# Patient Record
Sex: Female | Born: 1965
Health system: Southern US, Community
[De-identification: ages and names within clinical notes are randomized; demographics above are authoritative.]

## PROBLEM LIST (undated history)

## (undated) DIAGNOSIS — I10 Essential (primary) hypertension: Secondary | ICD-10-CM

## (undated) DIAGNOSIS — R112 Nausea with vomiting, unspecified: Secondary | ICD-10-CM

## (undated) DIAGNOSIS — I200 Unstable angina: Secondary | ICD-10-CM

## (undated) DIAGNOSIS — G43909 Migraine, unspecified, not intractable, without status migrainosus: Secondary | ICD-10-CM

## (undated) DIAGNOSIS — I2510 Atherosclerotic heart disease of native coronary artery without angina pectoris: Secondary | ICD-10-CM

## (undated) DIAGNOSIS — Z8719 Personal history of other diseases of the digestive system: Secondary | ICD-10-CM

## (undated) DIAGNOSIS — F329 Major depressive disorder, single episode, unspecified: Secondary | ICD-10-CM

## (undated) DIAGNOSIS — F32A Depression, unspecified: Secondary | ICD-10-CM

## (undated) DIAGNOSIS — F419 Anxiety disorder, unspecified: Secondary | ICD-10-CM

## (undated) DIAGNOSIS — I219 Acute myocardial infarction, unspecified: Secondary | ICD-10-CM

## (undated) DIAGNOSIS — M1990 Unspecified osteoarthritis, unspecified site: Secondary | ICD-10-CM

## (undated) DIAGNOSIS — F319 Bipolar disorder, unspecified: Secondary | ICD-10-CM

## (undated) DIAGNOSIS — D649 Anemia, unspecified: Secondary | ICD-10-CM

## (undated) DIAGNOSIS — Z9889 Other specified postprocedural states: Secondary | ICD-10-CM

## (undated) DIAGNOSIS — Z9289 Personal history of other medical treatment: Secondary | ICD-10-CM

## (undated) DIAGNOSIS — R079 Chest pain, unspecified: Secondary | ICD-10-CM

## (undated) HISTORY — PX: ECTOPIC PREGNANCY SURGERY: SHX613

## (undated) HISTORY — PX: FOOT FRACTURE SURGERY: SHX645

## (undated) HISTORY — PX: CORONARY ANGIOPLASTY WITH STENT PLACEMENT: SHX49

---
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
## 2008-05-15 ENCOUNTER — Emergency Department (HOSPITAL_COMMUNITY): Admission: EM | Admit: 2008-05-15 | Discharge: 2008-05-15 | Payer: Self-pay | Admitting: Emergency Medicine

## 2008-05-21 ENCOUNTER — Emergency Department (HOSPITAL_COMMUNITY): Admission: EM | Admit: 2008-05-21 | Discharge: 2008-05-21 | Payer: Self-pay | Admitting: Emergency Medicine

## 2008-10-05 ENCOUNTER — Emergency Department (HOSPITAL_COMMUNITY): Admission: EM | Admit: 2008-10-05 | Discharge: 2008-10-05 | Payer: Self-pay | Admitting: Emergency Medicine

## 2009-07-23 ENCOUNTER — Emergency Department (HOSPITAL_COMMUNITY): Admission: EM | Admit: 2009-07-23 | Discharge: 2009-07-23 | Payer: Self-pay | Admitting: Emergency Medicine

## 2010-03-22 ENCOUNTER — Emergency Department (HOSPITAL_COMMUNITY): Admission: EM | Admit: 2010-03-22 | Discharge: 2010-03-22 | Payer: Self-pay | Admitting: Emergency Medicine

## 2010-09-01 ENCOUNTER — Emergency Department (HOSPITAL_COMMUNITY)
Admission: EM | Admit: 2010-09-01 | Discharge: 2010-09-01 | Payer: Self-pay | Source: Home / Self Care | Admitting: Emergency Medicine

## 2011-02-06 ENCOUNTER — Emergency Department (HOSPITAL_COMMUNITY): Payer: Self-pay

## 2011-02-06 ENCOUNTER — Emergency Department (HOSPITAL_COMMUNITY)
Admission: EM | Admit: 2011-02-06 | Discharge: 2011-02-07 | Disposition: A | Discharge status: Home / Self Care | Payer: Self-pay | Attending: Emergency Medicine | Admitting: Emergency Medicine

## 2011-02-06 DIAGNOSIS — R51 Headache: Secondary | ICD-10-CM | POA: Insufficient documentation

## 2011-02-06 DIAGNOSIS — S139XXA Sprain of joints and ligaments of unspecified parts of neck, initial encounter: Secondary | ICD-10-CM | POA: Insufficient documentation

## 2011-02-06 DIAGNOSIS — M47812 Spondylosis without myelopathy or radiculopathy, cervical region: Secondary | ICD-10-CM | POA: Insufficient documentation

## 2011-02-06 DIAGNOSIS — M545 Low back pain, unspecified: Secondary | ICD-10-CM | POA: Insufficient documentation

## 2011-02-06 DIAGNOSIS — Y92410 Unspecified street and highway as the place of occurrence of the external cause: Secondary | ICD-10-CM | POA: Insufficient documentation

## 2011-02-09 ENCOUNTER — Inpatient Hospital Stay (INDEPENDENT_AMBULATORY_CARE_PROVIDER_SITE_OTHER)
Admission: RE | Admit: 2011-02-09 | Discharge: 2011-02-09 | Disposition: A | Discharge status: Home / Self Care | Payer: Self-pay | Source: Ambulatory Visit | Attending: Family Medicine | Admitting: Family Medicine

## 2011-02-09 ENCOUNTER — Ambulatory Visit (INDEPENDENT_AMBULATORY_CARE_PROVIDER_SITE_OTHER): Payer: Self-pay

## 2011-02-09 DIAGNOSIS — S335XXA Sprain of ligaments of lumbar spine, initial encounter: Secondary | ICD-10-CM

## 2011-02-20 LAB — DIFFERENTIAL
Basophils Absolute: 0.1 10*3/uL (ref 0.0–0.1)
Eosinophils Relative: 1 % (ref 0–5)
Monocytes Relative: 5 % (ref 3–12)
Neutro Abs: 5.9 10*3/uL (ref 1.7–7.7)
Neutrophils Relative %: 67 % (ref 43–77)

## 2011-02-20 LAB — POCT I-STAT, CHEM 8: Hemoglobin: 12.9 g/dL (ref 12.0–15.0)

## 2011-02-20 LAB — CBC
HCT: 36.5 % (ref 36.0–46.0)
Hemoglobin: 12.2 g/dL (ref 12.0–15.0)
Platelets: 241 10*3/uL (ref 150–400)

## 2011-04-17 ENCOUNTER — Emergency Department (HOSPITAL_COMMUNITY)
Admission: EM | Admit: 2011-04-17 | Discharge: 2011-04-17 | Disposition: A | Discharge status: Home / Self Care | Payer: Self-pay | Attending: Emergency Medicine | Admitting: Emergency Medicine

## 2011-04-17 ENCOUNTER — Emergency Department (HOSPITAL_COMMUNITY): Payer: Self-pay

## 2011-04-17 DIAGNOSIS — J45909 Unspecified asthma, uncomplicated: Secondary | ICD-10-CM | POA: Insufficient documentation

## 2011-04-17 DIAGNOSIS — M79609 Pain in unspecified limb: Secondary | ICD-10-CM | POA: Insufficient documentation

## 2011-04-17 DIAGNOSIS — R209 Unspecified disturbances of skin sensation: Secondary | ICD-10-CM | POA: Insufficient documentation

## 2011-04-17 DIAGNOSIS — M7989 Other specified soft tissue disorders: Secondary | ICD-10-CM | POA: Insufficient documentation

## 2011-09-21 ENCOUNTER — Emergency Department (INDEPENDENT_AMBULATORY_CARE_PROVIDER_SITE_OTHER)
Admission: EM | Admit: 2011-09-21 | Discharge: 2011-09-21 | Disposition: A | Discharge status: Home / Self Care | Payer: Self-pay | Source: Home / Self Care | Attending: Emergency Medicine | Admitting: Emergency Medicine

## 2011-09-21 ENCOUNTER — Encounter (HOSPITAL_COMMUNITY): Payer: Self-pay | Admitting: Emergency Medicine

## 2011-09-21 ENCOUNTER — Emergency Department (HOSPITAL_COMMUNITY)
Admission: EM | Admit: 2011-09-21 | Discharge: 2011-09-21 | Disposition: A | Discharge status: Home / Self Care | Payer: Self-pay | Attending: Emergency Medicine | Admitting: Emergency Medicine

## 2011-09-21 ENCOUNTER — Encounter (HOSPITAL_COMMUNITY): Payer: Self-pay

## 2011-09-21 DIAGNOSIS — T169XXA Foreign body in ear, unspecified ear, initial encounter: Secondary | ICD-10-CM

## 2011-09-21 DIAGNOSIS — IMO0002 Reserved for concepts with insufficient information to code with codable children: Secondary | ICD-10-CM | POA: Insufficient documentation

## 2011-09-21 NOTE — ED Notes (Signed)
Pt with tip of q-tip in left ear

## 2011-09-21 NOTE — ED Provider Notes (Addendum)
History     CSN: [id number] Arrival date & time: 09/21/2011  8:34 PM   First MD Initiated Contact with Patient 09/21/11 2044      Chief Complaint  Patient presents with  . Foreign Body in Ear    (Consider location/radiation/quality/duration/timing/severity/associated sxs/prior treatment) Patient is a 45 y.o. female presenting with foreign body in ear. The history is provided by the patient. No language interpreter was used.  Foreign Body in Ear This is a new problem. The problem occurs constantly. The problem has not changed since onset.Pertinent negatives include no headaches. The symptoms are aggravated by nothing.   "got a piece of cotton ball in my left ear"...its sore...and tender I tried,,,to get it out"... History reviewed. No pertinent past medical history.  History reviewed. No pertinent past surgical history.  History reviewed. No pertinent family history.  History  Substance Use Topics  . Smoking status: Current Everyday Smoker  . Smokeless tobacco: Not on file  . Alcohol Use: No    OB History    Grav Para Term Preterm Abortions TAB SAB Ect Mult Living                  Review of Systems  HENT: Positive for ear pain. Negative for hearing loss, tinnitus and ear discharge.   Neurological: Negative for headaches.    Allergies  Review of patient's allergies indicates no known allergies.  Home Medications  No current outpatient prescriptions on file.  BP 142/87  Pulse 81  Temp(Src) 98.3 F (36.8 C) (Oral)  Resp 20  SpO2 98%  Physical Exam  Constitutional: She appears well-developed and well-nourished.  HENT:  Right Ear: No foreign bodies.  Left Ear: Hearing normal. There is tenderness. No drainage or swelling. A foreign body is present. No decreased hearing is noted.    ED Course  Procedures (including critical care time)  Labs Reviewed - No data to display No results found.   No diagnosis found.    MDM  Uncomplicated removal of ear  foreign object        Jimmie Molly 09/21/11 2107  Jimmie Molly 09/21/11 2115

## 2012-07-03 ENCOUNTER — Telehealth (HOSPITAL_COMMUNITY): Payer: Self-pay | Admitting: *Deleted

## 2012-07-03 ENCOUNTER — Emergency Department (INDEPENDENT_AMBULATORY_CARE_PROVIDER_SITE_OTHER)
Admission: EM | Admit: 2012-07-03 | Discharge: 2012-07-03 | Disposition: A | Discharge status: Home / Self Care | Payer: Self-pay | Source: Home / Self Care | Attending: Emergency Medicine | Admitting: Emergency Medicine

## 2012-07-03 ENCOUNTER — Encounter (HOSPITAL_COMMUNITY): Payer: Self-pay | Admitting: *Deleted

## 2012-07-03 DIAGNOSIS — N920 Excessive and frequent menstruation with regular cycle: Secondary | ICD-10-CM

## 2012-07-03 DIAGNOSIS — D259 Leiomyoma of uterus, unspecified: Secondary | ICD-10-CM

## 2012-07-03 DIAGNOSIS — D649 Anemia, unspecified: Secondary | ICD-10-CM

## 2012-07-03 LAB — CBC WITH DIFFERENTIAL/PLATELET
Basophils Absolute: 0 10*3/uL (ref 0.0–0.1)
HCT: 31 % — ABNORMAL LOW (ref 36.0–46.0)
Lymphocytes Relative: 23 % (ref 12–46)
Lymphs Abs: 2.3 10*3/uL (ref 0.7–4.0)
MCH: 27 pg (ref 26.0–34.0)
MCHC: 31.9 g/dL (ref 30.0–36.0)
MCV: 84.7 fL (ref 78.0–100.0)
Monocytes Absolute: 0.5 10*3/uL (ref 0.1–1.0)
Monocytes Relative: 5 % (ref 3–12)
Neutro Abs: 7 10*3/uL (ref 1.7–7.7)
RDW: 13.8 % (ref 11.5–15.5)
WBC: 9.9 10*3/uL (ref 4.0–10.5)

## 2012-07-03 LAB — POCT URINALYSIS DIP (DEVICE)
Leukocytes, UA: NEGATIVE
Nitrite: NEGATIVE
Protein, ur: 30 mg/dL — AB
Urobilinogen, UA: 0.2 mg/dL (ref 0.0–1.0)

## 2012-07-03 MED ORDER — MEDROXYPROGESTERONE ACETATE 5 MG PO TABS: 10.0000 mg | ORAL_TABLET | Freq: Every day | ORAL | Status: DC

## 2012-07-03 NOTE — ED Notes (Signed)
Pt reports menstrual bleeding X 6 weeks with lower abdominal pain and HA.

## 2012-07-03 NOTE — ED Provider Notes (Signed)
Chief Complaint  Patient presents with  . Menstrual Problem    History of Present Illness:   Emma Turner is a 46 year old female who has had a one-month history of heavy vaginal bleeding with clots. This is heavier than normal menstrual flow. She also has mild pelvic pain, nausea, occasional vomiting, and some migraine headaches. She also notes slight dysuria. She's not had a Pap or pelvic exam for about 4 years. She is sexually active but has had a tubal ligation. She denies any fever or chills. Prior to this episode her menses were fairly regular lasting 4-5 days. She denies any vaginal discharge or itching.  Review of Systems:  Other than noted above, the patient denies any of the following symptoms: Systemic:  No fever, chills, sweats, fatigue, or weight loss. GI:  No abdominal pain, nausea, anorexia, vomiting, diarrhea, constipation, melena or hematochezia. GU:  No dysuria, frequency, urgency, hematuria, vaginal discharge, itching, or abnormal vaginal bleeding. Skin:  No rash or itching.   PMFSH:  Past medical history, family history, social history, meds, and allergies were reviewed.  Physical Exam:   Vital signs:  BP 138/84  Pulse 80  Temp 98.6 F (37 C) (Oral)  Resp 18  SpO2 100%  LMP 07/03/2012 General:  Alert, oriented and in no distress. Lungs:  Breath sounds clear and equal bilaterally.  No wheezes, rales or rhonchi. Heart:  Regular rhythm.  No gallops or murmers. Abdomen:  Soft, flat and non-distended.  No organomegaly or mass.  No tenderness, guarding or rebound.  Bowel sounds normally active. Pelvic exam:  Normal external genitalia, there was a moderate amount of blood in the vaginal vault without any clots or tissue. The cervix appeared normal. There was minimal pain on cervical motion. The uterus was markedly enlarged in size but not tender. She has minimal bilateral adnexal tenderness but no masses. Skin:  Clear, warm and dry.  Labs:   Results for orders placed during the  hospital encounter of 07/03/12  POCT URINALYSIS DIP (DEVICE)      Component Value Range   Glucose, UA NEGATIVE  NEGATIVE mg/dL   Bilirubin Urine NEGATIVE  NEGATIVE   Ketones, ur NEGATIVE  NEGATIVE mg/dL   Specific Gravity, Urine 1.020  1.005 - 1.030   Hgb urine dipstick LARGE (*) NEGATIVE   pH 5.5  5.0 - 8.0   Protein, ur 30 (*) NEGATIVE mg/dL   Urobilinogen, UA 0.2  0.0 - 1.0 mg/dL   Nitrite NEGATIVE  NEGATIVE   Leukocytes, UA NEGATIVE  NEGATIVE  POCT PREGNANCY, URINE      Component Value Range   Preg Test, Ur NEGATIVE  NEGATIVE  CBC WITH DIFFERENTIAL      Component Value Range   WBC 9.9  4.0 - 10.5 K/uL   RBC 3.66 (*) 3.87 - 5.11 MIL/uL   Hemoglobin 9.9 (*) 12.0 - 15.0 g/dL   HCT 82.9 (*) 56.2 - 13.0 %   MCV 84.7  78.0 - 100.0 fL   MCH 27.0  26.0 - 34.0 pg   MCHC 31.9  30.0 - 36.0 g/dL   RDW 86.5  78.4 - 69.6 %   Platelets 294  150 - 400 K/uL   Neutrophils Relative 70  43 - 77 %   Neutro Abs 7.0  1.7 - 7.7 K/uL   Lymphocytes Relative 23  12 - 46 %   Lymphs Abs 2.3  0.7 - 4.0 K/uL   Monocytes Relative 5  3 - 12 %   Monocytes Absolute 0.5  0.1 - 1.0 K/uL   Eosinophils Relative 1  0 - 5 %   Eosinophils Absolute 0.1  0.0 - 0.7 K/uL   Basophils Relative 0  0 - 1 %   Basophils Absolute 0.0  0.0 - 0.1 K/uL  WET PREP, GENITAL      Component Value Range   Yeast Wet Prep HPF POC NONE SEEN  NONE SEEN   Trich, Wet Prep NONE SEEN  NONE SEEN   Clue Cells Wet Prep HPF POC NONE SEEN  NONE SEEN   WBC, Wet Prep HPF POC NONE SEEN  NONE SEEN    Other Labs Obtained at Urgent Care Center:  GC and Chlamydia DNA probes were obtained.  Results are pending at this time and we will call about any positive results.  Assessment:  The primary encounter diagnosis was Menorrhagia. Diagnoses of Fibroid uterus and Anemia were also pertinent to this visit. The cause of the menorrhagia could be uterine fibroids which would make sense since her uterus was palpably enlarged on pelvic exam, but other  possibilities would include uterine cancer or dysfunctional uterine bleeding. I believe she needs a complete gynecological workup and have referred her to the gynecologist on call today Dr. Juliene Pina. I specifically told her that her differential diagnosis includes cancer and that it would be of utmost importance for her to followup. I do not see any evidence of pelvic inflammatory disease.  Plan:   1.  The following meds were prescribed:   New Prescriptions   MEDROXYPROGESTERONE (PROVERA) 5 MG TABLET    Take 2 tablets (10 mg total) by mouth daily.   2.  The patient was instructed in symptomatic care and handouts were given. 3.  The patient was told to return if becoming worse in any way, if no better in 3 or 4 days, and given some red flag symptoms that would indicate earlier return.  Follow up:  The patient was told to follow up with Dr. Juliene Pina next week.  The patient was told that the differential diagnosis includes cancer, and that it was of the utmost importance to followup with the specialist to whom she was referred.      Reuben Likes, MD 07/03/12 [phone]

## 2012-07-04 ENCOUNTER — Telehealth (HOSPITAL_COMMUNITY): Payer: Self-pay | Admitting: *Deleted

## 2012-07-04 LAB — URINE CULTURE

## 2012-07-04 LAB — GC/CHLAMYDIA PROBE AMP, GENITAL: GC Probe Amp, Genital: NEGATIVE

## 2012-07-04 NOTE — ED Notes (Signed)
Pt. Called on VM  and said the Rx. of Medroxyprogesterone was not sent to her pharmacy.  She checked yesterday and this morning.  I accessed her chart and called pt. back. I told her it look like it was printed. I asked her to check her d/c instructions for a light purple paper. She said she did not have it.  I told her I would call it in to her preferred pharmacy after talking to Dr. Lorenz Coaster. It should be ready in 1 hr.  Dr. Lorenz Coaster said he thought it was e-prescribed.  He said I could call it in, if its not there.  I called [facility] at [employer] and they don't have it.  Pharmacist given the order as written.

## 2013-05-18 ENCOUNTER — Emergency Department (HOSPITAL_COMMUNITY): Payer: Self-pay

## 2013-05-18 ENCOUNTER — Encounter (HOSPITAL_COMMUNITY): Payer: Self-pay | Admitting: Emergency Medicine

## 2013-05-18 ENCOUNTER — Emergency Department (HOSPITAL_COMMUNITY)
Admission: EM | Admit: 2013-05-18 | Discharge: 2013-05-18 | Disposition: A | Discharge status: Home / Self Care | Payer: Self-pay | Attending: Emergency Medicine | Admitting: Emergency Medicine

## 2013-05-18 DIAGNOSIS — W010XXA Fall on same level from slipping, tripping and stumbling without subsequent striking against object, initial encounter: Secondary | ICD-10-CM | POA: Insufficient documentation

## 2013-05-18 DIAGNOSIS — Y9229 Other specified public building as the place of occurrence of the external cause: Secondary | ICD-10-CM | POA: Insufficient documentation

## 2013-05-18 DIAGNOSIS — F17200 Nicotine dependence, unspecified, uncomplicated: Secondary | ICD-10-CM | POA: Insufficient documentation

## 2013-05-18 DIAGNOSIS — Z79899 Other long term (current) drug therapy: Secondary | ICD-10-CM | POA: Insufficient documentation

## 2013-05-18 DIAGNOSIS — W19XXXA Unspecified fall, initial encounter: Secondary | ICD-10-CM

## 2013-05-18 DIAGNOSIS — M549 Dorsalgia, unspecified: Secondary | ICD-10-CM

## 2013-05-18 DIAGNOSIS — Y9301 Activity, walking, marching and hiking: Secondary | ICD-10-CM | POA: Insufficient documentation

## 2013-05-18 DIAGNOSIS — IMO0002 Reserved for concepts with insufficient information to code with codable children: Secondary | ICD-10-CM | POA: Insufficient documentation

## 2013-05-18 MED ORDER — OXYCODONE-ACETAMINOPHEN 5-325 MG PO TABS: 1.0000 | ORAL_TABLET | Freq: Four times a day (QID) | ORAL | Status: DC | PRN

## 2013-05-18 MED ORDER — IBUPROFEN 600 MG PO TABS: 600.0000 mg | ORAL_TABLET | Freq: Four times a day (QID) | ORAL | Status: DC | PRN

## 2013-05-18 MED ORDER — OXYCODONE-ACETAMINOPHEN 5-325 MG PO TABS
1.0000 | ORAL_TABLET | Freq: Once | ORAL | Status: AC
  Administered 2013-05-18: 1 via ORAL
  Filled 2013-05-18: qty 1
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
## 2013-05-18 NOTE — ED Provider Notes (Signed)
History    CSN: [id number] Arrival date & time 05/18/13  1109  First MD Initiated Contact with Patient 05/18/13 1110     Chief Complaint  Patient presents with  . Fall   (Consider location/radiation/quality/duration/timing/severity/associated sxs/prior Treatment) HPI Comments: Patient with complaints of lower back pain after a fall that occurred just prior to arrival. Patient states that she was walking from a wet carpet onto a floor and slipped falling backwards onto her lower back and tailbone. She does not think that she hit her head. She denies loss of consciousness. She denies headache or neck pain. No blurry vision, vomiting, weakness in arms or legs. EMS was called and patient was placed on a long spine board. No c-collar or suicidal plenty of neck pain. No other treatments prior to arrival. Onset acute. Course is constant. Movement and palpation makes the pain worse. Nothing makes it better.   The history is provided by the patient.   No past medical history on file. No past surgical history on file. No family history on file. History  Substance Use Topics  . Smoking status: Current Every Day Smoker  . Smokeless tobacco: Not on file  . Alcohol Use: No   OB History   Grav Para Term Preterm Abortions TAB SAB Ect Mult Living                 Review of Systems  Constitutional: Negative for fever, fatigue and unexpected weight change.  HENT: Negative for neck pain and tinnitus.   Eyes: Negative for photophobia, pain and visual disturbance.  Respiratory: Negative for shortness of breath.   Cardiovascular: Negative for chest pain.  Gastrointestinal: Negative for nausea, vomiting and constipation.       Negative for fecal incontinence.   Genitourinary: Negative for dysuria, hematuria, flank pain, vaginal bleeding, vaginal discharge and pelvic pain.       Negative for urinary incontinence or retention.  Musculoskeletal: Positive for back pain. Negative for gait problem.  Skin:  Negative for wound.  Neurological: Negative for dizziness, weakness, light-headedness, numbness and headaches.       Denies saddle paresthesias.  Psychiatric/Behavioral: Negative for confusion and decreased concentration.    Allergies  Review of patient's allergies indicates no known allergies.  Home Medications   Current Outpatient Rx  Name  Route  Sig  Dispense  Refill  . medroxyPROGESTERone (PROVERA) 5 MG tablet   Oral   Take 2 tablets (10 mg total) by mouth daily.   30 tablet   0    BP 131/86  Pulse 60  Temp(Src) 98.2 F (36.8 C) (Oral)  Resp 14  SpO2 99%  LMP 05/04/2013 Physical Exam  Nursing note and vitals reviewed. Constitutional: She is oriented to person, place, and time. She appears well-developed and well-nourished.  HENT:  Head: Normocephalic and atraumatic. Head is without raccoon's eyes and without Battle's sign.  Right Ear: Tympanic membrane, external ear and ear canal normal. No hemotympanum.  Left Ear: Tympanic membrane, external ear and ear canal normal. No hemotympanum.  Nose: Nose normal. No nasal septal hematoma.  Mouth/Throat: Uvula is midline, oropharynx is clear and moist and mucous membranes are normal.  Eyes: Conjunctivae, EOM and lids are normal. Pupils are equal, round, and reactive to light. Right eye exhibits no nystagmus. Left eye exhibits no nystagmus.  No visible hyphema noted  Neck: Normal range of motion. Neck supple.  Cardiovascular: Normal rate and regular rhythm.   Pulmonary/Chest: Effort normal and breath sounds normal.  Abdominal:  Soft. There is no tenderness. There is no CVA tenderness.  Musculoskeletal: Normal range of motion.       Cervical back: She exhibits normal range of motion, no tenderness and no bony tenderness.       Thoracic back: She exhibits no tenderness and no bony tenderness.       Lumbar back: She exhibits tenderness and bony tenderness.       Back:  No step-off noted with palpation of spine.   Neurological:  She is alert and oriented to person, place, and time. She has normal strength and normal reflexes. No cranial nerve deficit or sensory deficit. Coordination normal. GCS eye subscore is 4. GCS verbal subscore is 5. GCS motor subscore is 6.  5/5 strength in entire lower extremities bilaterally. No sensation deficit.   Skin: Skin is warm and dry. No rash noted.  Psychiatric: She has a normal mood and affect.    ED Course  Procedures (including critical care time) Labs Reviewed - No data to display Dg Lumbar Spine Complete  05/18/2013   *RADIOLOGY REPORT*  Clinical Data: Low back pain.  LUMBAR SPINE - COMPLETE 4+ VIEW  Comparison: 02/09/2011  Findings: There are five lumbar-type vertebral bodies.  There is facet arthropathy at L4-5 and L5-S1.  Disc space heights are normal.  Findings are similar to the study of 2012.  IMPRESSION: Lower lumbar facet arthropathy could be a cause of pain.  No acute finding.   Original Report Authenticated By: Paulina Fusi, M.D.   Dg Sacrum/coccyx  05/18/2013   *RADIOLOGY REPORT*  Clinical Data: Larey Seat.  Sacrococcygeal pain.  SACRUM AND COCCYX - 2+ VIEW  Comparison: Same day  Findings: No evidence of sacral or coccygeal fracture.  There are mild osteoarthritic changes of the sacroiliac joints and there is facet arthropathy in the lower lumbar spine.  IMPRESSION: No acute sacral or coccygeal finding.   Original Report Authenticated By: Paulina Fusi, M.D.   1. Back pain   2. Fall, initial encounter     11:22 AM Patient seen and examined. Work-up initiated. Medications ordered.   Vital signs reviewed and are as follows: Filed Vitals:   05/18/13 1215  BP: 131/86  Pulse: 60  Temp:   Resp:     Date: 05/18/2013  Rate: 64  Rhythm: normal sinus rhythm  QRS Axis: normal  Intervals: normal  ST/T Wave abnormalities: normal  Conduction Disutrbances:none  Narrative Interpretation:   Old EKG Reviewed: none available  1:30 PM Pt informed of results. She has ambulated in  hallway without assistance. Will discharge to home.   Patient counseled on use of narcotic pain medications. Counseled not to combine these medications with others containing tylenol. Urged not to drink alcohol, drive, or perform any other activities that requires focus while taking these medications. The patient verbalizes understanding and agrees with the plan.  MDM  Patient s/p fall, lower back pain. Imaging is negative. Neuro exam is normal. No signs of cauda equine. No red flags. Pt ambulatory. D/c to home with conservative mgmt and pain control.     Renne Crigler, PA-C 05/18/13 1413

## 2013-05-18 NOTE — ED Notes (Signed)
Pt back from x-ray.

## 2013-05-18 NOTE — ED Notes (Signed)
Pt states pain medication is not working. MD placed an order

## 2013-05-18 NOTE — ED Notes (Signed)
Patient transported to X-ray 

## 2013-05-18 NOTE — ED Notes (Signed)
Discharge instructions reviewed. Pt verbalized understanding.  

## 2013-05-18 NOTE — ED Notes (Signed)
Pt refused to ambulate, i told her that dr wasn't going to let her have her meds until she ambulates and hit the cll bell when shes ready.1315 pm JG.

## 2013-05-19 NOTE — ED Provider Notes (Signed)
Medical screening examination/treatment/procedure(s) were performed by non-physician practitioner and as supervising physician I was immediately available for consultation/collaboration.  Alailah Safley R. Andersen Iorio, MD 05/19/13 1651 

## 2013-06-09 ENCOUNTER — Encounter (HOSPITAL_COMMUNITY): Payer: Self-pay | Admitting: Emergency Medicine

## 2013-06-09 ENCOUNTER — Emergency Department (HOSPITAL_COMMUNITY)
Admission: EM | Admit: 2013-06-09 | Discharge: 2013-06-09 | Disposition: A | Discharge status: Home / Self Care | Payer: Self-pay | Attending: Emergency Medicine | Admitting: Emergency Medicine

## 2013-06-09 DIAGNOSIS — M25572 Pain in left ankle and joints of left foot: Secondary | ICD-10-CM

## 2013-06-09 DIAGNOSIS — S8990XA Unspecified injury of unspecified lower leg, initial encounter: Secondary | ICD-10-CM | POA: Insufficient documentation

## 2013-06-09 DIAGNOSIS — I10 Essential (primary) hypertension: Secondary | ICD-10-CM | POA: Insufficient documentation

## 2013-06-09 DIAGNOSIS — Y9389 Activity, other specified: Secondary | ICD-10-CM | POA: Insufficient documentation

## 2013-06-09 DIAGNOSIS — X500XXA Overexertion from strenuous movement or load, initial encounter: Secondary | ICD-10-CM | POA: Insufficient documentation

## 2013-06-09 DIAGNOSIS — S99919A Unspecified injury of unspecified ankle, initial encounter: Secondary | ICD-10-CM | POA: Insufficient documentation

## 2013-06-09 DIAGNOSIS — Y92009 Unspecified place in unspecified non-institutional (private) residence as the place of occurrence of the external cause: Secondary | ICD-10-CM | POA: Insufficient documentation

## 2013-06-09 DIAGNOSIS — F17200 Nicotine dependence, unspecified, uncomplicated: Secondary | ICD-10-CM | POA: Insufficient documentation

## 2013-06-09 HISTORY — DX: Essential (primary) hypertension: I10

## 2013-06-09 MED ORDER — HYDROCODONE-ACETAMINOPHEN 5-325 MG PO TABS: 1.0000 | ORAL_TABLET | Freq: Four times a day (QID) | ORAL | Status: DC | PRN

## 2013-06-09 MED ORDER — NAPROXEN 500 MG PO TABS: 500.0000 mg | ORAL_TABLET | Freq: Two times a day (BID) | ORAL | Status: DC

## 2013-06-09 MED ORDER — TRAMADOL HCL 50 MG PO TABS: 50.0000 mg | ORAL_TABLET | Freq: Four times a day (QID) | ORAL | Status: DC | PRN

## 2013-06-09 NOTE — ED Notes (Signed)
Pt refuses ASO at this time. States "I want to wait until the swelling goes down to put it on".

## 2013-06-09 NOTE — ED Provider Notes (Signed)
CSN: [id number]     Arrival date & time 06/09/13  1019 History     First MD Initiated Contact with Patient 06/09/13 1023     Chief Complaint  Patient presents with  . Leg Pain   (Consider location/radiation/quality/duration/timing/severity/associated sxs/prior Treatment) HPI Comments: Patient presenting with pain of the left ankle.  Pain is located over both the lateral and medial malleolus.  Pain has been present intermittently over the past 3 weeks.  She has also had some swelling intermittently over the past 3 weeks.  She states that she twisted her ankle three weeks ago, but no injury or trauma since that time.  She has been ambulating since that time without difficulty.  However, she states that yesterday she walked a long distance and the pain and swelling became worse.  She denies any erythema of the ankle.  No fever or chills.  She had two separate surgeries done on that ankle back in 1998.  She reports that she has had occasional swelling of the ankle since that time.  She denies any pain, erythema, or swelling of her calf.  Denies prolonged travel or surgeries in the past 4 weeks.  Denies taking any estrogen medication.  Denies prior history of DVT or PE.  Denies numbness or tingling.  Denies fever or chills.  The history is provided by the patient.    Past Medical History  Diagnosis Date  . Hypertension    Past Surgical History  Procedure Laterality Date  . Foot surgery    . Ectopic pregnancy surgery     No family history on file. History  Substance Use Topics  . Smoking status: Current Every Day Smoker -- 2.00 packs/day    Types: Cigarettes  . Smokeless tobacco: Not on file  . Alcohol Use: No   OB History   Grav Para Term Preterm Abortions TAB SAB Ect Mult Living                 Review of Systems  Musculoskeletal:       Left ankle pain    Allergies  Review of patient's allergies indicates no known allergies.  Home Medications   Current Outpatient Rx  Name   Route  Sig  Dispense  Refill  . ibuprofen (ADVIL,MOTRIN) 600 MG tablet   Oral   Take 1 tablet (600 mg total) by mouth every 6 (six) hours as needed for pain.   20 tablet   0    BP 155/99  Pulse 85  Temp(Src) 97.4 F (36.3 C) (Oral)  SpO2 100%  LMP 05/04/2013 Physical Exam  Nursing note and vitals reviewed. Constitutional: She appears well-developed and well-nourished. No distress.  HENT:  Head: Normocephalic and atraumatic.  Cardiovascular: Normal rate, regular rhythm and normal heart sounds.   Pulses:      Dorsalis pedis pulses are 2+ on the right side, and 2+ on the left side.  Pulmonary/Chest: Effort normal and breath sounds normal.  Musculoskeletal: Normal range of motion.       Left ankle: She exhibits normal range of motion, no swelling, no deformity and normal pulse. Tenderness. Lateral malleolus and medial malleolus tenderness found.  Negative Homan's sign bilaterally No swelling or erythema of the calf.    Neurological: She is alert. No sensory deficit.  Skin: Skin is warm and dry. She is not diaphoretic. No erythema.  No erythema or warmth of the left ankle.  Psychiatric: She has a normal mood and affect.    ED Course  Procedures (including critical care time)  Labs Reviewed - No data to display No results found. No diagnosis found.  MDM  Patient presenting with pain and intermittent swelling of the left ankle that has been present for the past 3 weeks.  She denies any acute injury or trauma in the past 3 weeks.  No signs of infection.  She does report that she has had two prior surgeries of that ankle in the past.  Patient given Ankle ASO and instructed to follow up with the Orthopedist that she has seen in the past.  Magnus Sinning, PA-C 06/09/13 1534

## 2013-06-09 NOTE — ED Notes (Signed)
Pt states she is not able to take Ultram because it makes her nauseated.

## 2013-06-09 NOTE — ED Notes (Signed)
Pt c/o left leg pain and swelling x 2 days. Has painful rash on left lower leg since last pm. C/o right ankle pain since fall on 7/3. Also has back pain since that fall.

## 2013-06-10 NOTE — ED Provider Notes (Signed)
Medical screening examination/treatment/procedure(s) were performed by non-physician practitioner and as supervising physician I was immediately available for consultation/collaboration.   Maleik Vanderzee Ann Raynaldo Falco, MD 06/10/13 0946 

## 2013-06-16 ENCOUNTER — Emergency Department (HOSPITAL_COMMUNITY)
Admission: EM | Admit: 2013-06-16 | Discharge: 2013-06-17 | Disposition: A | Discharge status: Home / Self Care | Payer: Self-pay | Attending: Emergency Medicine | Admitting: Emergency Medicine

## 2013-06-16 ENCOUNTER — Encounter (HOSPITAL_COMMUNITY): Payer: Self-pay | Admitting: Emergency Medicine

## 2013-06-16 DIAGNOSIS — F329 Major depressive disorder, single episode, unspecified: Secondary | ICD-10-CM | POA: Insufficient documentation

## 2013-06-16 DIAGNOSIS — F17200 Nicotine dependence, unspecified, uncomplicated: Secondary | ICD-10-CM | POA: Insufficient documentation

## 2013-06-16 DIAGNOSIS — Z5900 Homelessness unspecified: Secondary | ICD-10-CM

## 2013-06-16 DIAGNOSIS — F4310 Post-traumatic stress disorder, unspecified: Secondary | ICD-10-CM | POA: Diagnosis present

## 2013-06-16 DIAGNOSIS — IMO0001 Reserved for inherently not codable concepts without codable children: Secondary | ICD-10-CM

## 2013-06-16 DIAGNOSIS — I10 Essential (primary) hypertension: Secondary | ICD-10-CM | POA: Insufficient documentation

## 2013-06-16 DIAGNOSIS — F3289 Other specified depressive episodes: Secondary | ICD-10-CM | POA: Insufficient documentation

## 2013-06-16 DIAGNOSIS — F32A Depression, unspecified: Secondary | ICD-10-CM

## 2013-06-16 DIAGNOSIS — Z8659 Personal history of other mental and behavioral disorders: Secondary | ICD-10-CM | POA: Insufficient documentation

## 2013-06-16 DIAGNOSIS — F411 Generalized anxiety disorder: Secondary | ICD-10-CM | POA: Insufficient documentation

## 2013-06-16 HISTORY — DX: Major depressive disorder, single episode, unspecified: F32.9

## 2013-06-16 HISTORY — DX: Depression, unspecified: F32.A

## 2013-06-16 HISTORY — DX: Bipolar disorder, unspecified: F31.9

## 2013-06-16 LAB — SALICYLATE LEVEL: Salicylate Lvl: <2 mg/dL — ABNORMAL LOW (ref 2.8–20.0)

## 2013-06-16 LAB — RAPID URINE DRUG SCREEN, HOSP PERFORMED
Cocaine: NOT DETECTED
Opiates: NOT DETECTED
Tetrahydrocannabinol: POSITIVE — AB

## 2013-06-16 LAB — COMPREHENSIVE METABOLIC PANEL
ALT: 12 U/L (ref 0–35)
CO2: 27 mEq/L (ref 19–32)
Calcium: 9.6 mg/dL (ref 8.4–10.5)
Creatinine, Ser: 0.74 mg/dL (ref 0.50–1.10)
GFR calc Af Amer: >90 mL/min (ref >90)
GFR calc non Af Amer: >90 mL/min (ref >90)
Glucose, Bld: 112 mg/dL — ABNORMAL HIGH (ref 70–99)
Sodium: 139 mEq/L (ref 135–145)
Total Protein: 7.3 g/dL (ref 6.0–8.3)

## 2013-06-16 LAB — ETHANOL: Alcohol, Ethyl (B): <11 mg/dL (ref 0–11)

## 2013-06-16 LAB — CBC
Hemoglobin: 13.8 g/dL (ref 12.0–15.0)
MCH: 28.6 pg (ref 26.0–34.0)
MCHC: 33.4 g/dL (ref 30.0–36.0)
MCV: 85.7 fL (ref 78.0–100.0)
Platelets: 247 10*3/uL (ref 150–400)

## 2013-06-16 MED ORDER — IBUPROFEN 200 MG PO TABS
600.0000 mg | ORAL_TABLET | Freq: Three times a day (TID) | ORAL | Status: DC | PRN
  Administered 2013-06-16: 600 mg via ORAL
  Filled 2013-06-16: qty 3

## 2013-06-16 MED ORDER — LORAZEPAM 1 MG PO TABS
1.0000 mg | ORAL_TABLET | Freq: Three times a day (TID) | ORAL | Status: DC | PRN
  Administered 2013-06-16: 1 mg via ORAL
  Filled 2013-06-16: qty 1

## 2013-06-16 MED ORDER — NICOTINE 21 MG/24HR TD PT24: 21.0000 mg | MEDICATED_PATCH | Freq: Every day | TRANSDERMAL | Status: DC

## 2013-06-16 MED ORDER — ONDANSETRON HCL 4 MG PO TABS: 4.0000 mg | ORAL_TABLET | Freq: Three times a day (TID) | ORAL | Status: DC | PRN

## 2013-06-16 MED ORDER — ZOLPIDEM TARTRATE 5 MG PO TABS
5.0000 mg | ORAL_TABLET | Freq: Every evening | ORAL | Status: DC | PRN
  Administered 2013-06-16: 5 mg via ORAL
  Filled 2013-06-16: qty 1

## 2013-06-16 MED ORDER — ACETAMINOPHEN 325 MG PO TABS: 650.0000 mg | ORAL_TABLET | ORAL | Status: DC | PRN

## 2013-06-16 MED ORDER — ALUM & MAG HYDROXIDE-SIMETH 200-200-20 MG/5ML PO SUSP: 30.0000 mL | ORAL | Status: DC | PRN
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
## 2013-06-16 NOTE — ED Notes (Addendum)
PER [employer]- pt sent over from [facility] with c/o HTN.  Pt is IVC'd. Pt is also SI, reports she wants to jump off a bridge.

## 2013-06-16 NOTE — ED Provider Notes (Signed)
CSN: [id number]     Arrival date & time 06/16/13  1945 History     First MD Initiated Contact with Patient 06/16/13 2000     Chief Complaint  Patient presents with  . Medical Clearance  . Hypertension   (Consider location/radiation/quality/duration/timing/severity/associated sxs/prior Treatment) HPI Pt presents with c/o depression and anxiety.  She states she has been under increased stress.  She presents under IVC and was not accepted at [facility] due to having hypertension.  She states that her boyfriend recently left her and she has no place to live.  She does report thinking of jumping from a bridge.  To other providers she stated she is not suicidal.  She denies any recent illness.  She denies chest pain, no sob, no changes in vision or speech, no weakness of extremities.  She has not been on any BP meds in the past.  There are no other associated systemic symptoms, there are no other alleviating or modifying factors.   Past Medical History  Diagnosis Date  . Hypertension   . Depression   . Bipolar 1 disorder    Past Surgical History  Procedure Laterality Date  . Foot surgery    . Ectopic pregnancy surgery     No family history on file. History  Substance Use Topics  . Smoking status: Current Every Day Smoker -- 2.00 packs/day    Types: Cigarettes  . Smokeless tobacco: Not on file  . Alcohol Use: No   OB History   Grav Para Term Preterm Abortions TAB SAB Ect Mult Living                 Review of Systems ROS reviewed and all otherwise negative except for mentioned in HPI  Allergies  Review of patient's allergies indicates no known allergies.  Home Medications   Current Outpatient Rx  Name  Route  Sig  Dispense  Refill  . HYDROcodone-acetaminophen (NORCO/VICODIN) 5-325 MG per tablet   Oral   Take 1-2 tablets by mouth every 6 (six) hours as needed for pain.   15 tablet   0   . Ibuprofen-Diphenhydramine HCl (ADVIL PM) 200-25 MG CAPS   Oral   Take 1 tablet by  mouth at bedtime as needed (for sleep).          BP 156/99  Pulse 55  Temp(Src) 98.5 F (36.9 C) (Oral)  Resp 20  SpO2 100%  LMP 05/04/2013 Vitals reviewed Physical Exam Physical Examination: General appearance - alert, well appearing, and in no distress Mental status - alert, oriented to person, place, and time Eyes - no scleral icterus, no conjunctival injection Mouth - mucous membranes moist, pharynx normal without lesions Chest - clear to auscultation, no wheezes, rales or rhonchi, symmetric air entry Heart - normal rate, regular rhythm, normal S1, S2, no murmurs, rubs, clicks or gallops Abdomen - soft, nontender, nondistended, no masses or organomegaly Extremities - peripheral pulses normal, no pedal edema, no clubbing or cyanosis Skin - normal coloration and turgor, no rashes Psych- tearful, anxious appearing  ED Course   Procedures (including critical care time)  Labs Reviewed  COMPREHENSIVE METABOLIC PANEL - Abnormal; Notable for the following:    Glucose, Bld 112 (*)    All other components within normal limits  SALICYLATE LEVEL - Abnormal; Notable for the following:    Salicylate Lvl <2.0 (*)    All other components within normal limits  URINE RAPID DRUG SCREEN (HOSP PERFORMED) - Abnormal; Notable for the following:  Tetrahydrocannabinol POSITIVE (*)    All other components within normal limits  ACETAMINOPHEN LEVEL  CBC  ETHANOL   No results found. 1. Depression     MDM  Pt presents with c/o anxiety/depression, she has hx of hypertension-she was declined at [facility] due to hypertension.  Pt has no evidence of end organ damage, asymptomatic in terms of hypertension.  Psych holding orders written, pt will need psych eval.     Ethelda Chick, MD 06/17/13 [phone]

## 2013-06-17 ENCOUNTER — Encounter (HOSPITAL_COMMUNITY): Payer: Self-pay | Admitting: *Deleted

## 2013-06-17 DIAGNOSIS — Z5900 Homelessness unspecified: Secondary | ICD-10-CM

## 2013-06-17 DIAGNOSIS — IMO0001 Reserved for inherently not codable concepts without codable children: Secondary | ICD-10-CM

## 2013-06-17 DIAGNOSIS — F1210 Cannabis abuse, uncomplicated: Secondary | ICD-10-CM

## 2013-06-17 DIAGNOSIS — F4310 Post-traumatic stress disorder, unspecified: Secondary | ICD-10-CM | POA: Diagnosis present

## 2013-06-17 NOTE — BHH Counselor (Signed)
Pt.'s IVC petition has been rescinded by Dr. Mancel Bale and pt has been provided resources and bus pass. Pt has been cleared by Maryjean Morn, PA and will be d/c'd from psych ed.
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
## 2013-06-17 NOTE — ED Provider Notes (Signed)
She has been seen by psychiatry, and  [facility]. She's not suicidal. She has been given resources for followup care.    Flint Melter, MD 06/17/13 [phone]
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
## 2013-11-01 ENCOUNTER — Ambulatory Visit: Payer: Self-pay

## 2014-02-07 ENCOUNTER — Encounter (HOSPITAL_COMMUNITY): Payer: Self-pay | Admitting: Emergency Medicine

## 2014-02-07 ENCOUNTER — Emergency Department (HOSPITAL_COMMUNITY)
Admission: EM | Admit: 2014-02-07 | Discharge: 2014-02-07 | Discharge status: Against Medical Advice | Payer: Self-pay | Attending: Emergency Medicine | Admitting: Emergency Medicine

## 2014-02-07 DIAGNOSIS — F17200 Nicotine dependence, unspecified, uncomplicated: Secondary | ICD-10-CM | POA: Insufficient documentation

## 2014-02-07 DIAGNOSIS — I10 Essential (primary) hypertension: Secondary | ICD-10-CM | POA: Insufficient documentation

## 2014-02-07 DIAGNOSIS — R51 Headache: Secondary | ICD-10-CM | POA: Insufficient documentation

## 2014-02-07 DIAGNOSIS — R110 Nausea: Secondary | ICD-10-CM | POA: Insufficient documentation

## 2014-02-07 NOTE — ED Notes (Signed)
Pt. reports headache with nausea and hypertension for 2 weeks , pt. Is not taking antihypertensive medications .

## 2014-12-13 ENCOUNTER — Encounter (HOSPITAL_COMMUNITY): Payer: Self-pay | Admitting: Emergency Medicine

## 2014-12-13 ENCOUNTER — Emergency Department (HOSPITAL_COMMUNITY)
Admission: EM | Admit: 2014-12-13 | Discharge: 2014-12-13 | Disposition: A | Discharge status: Home / Self Care | Payer: Worker's Compensation | Attending: Emergency Medicine | Admitting: Emergency Medicine

## 2014-12-13 ENCOUNTER — Emergency Department (HOSPITAL_COMMUNITY): Payer: Worker's Compensation

## 2014-12-13 DIAGNOSIS — S0990XA Unspecified injury of head, initial encounter: Secondary | ICD-10-CM | POA: Diagnosis present

## 2014-12-13 DIAGNOSIS — I10 Essential (primary) hypertension: Secondary | ICD-10-CM | POA: Diagnosis not present

## 2014-12-13 DIAGNOSIS — Z720 Tobacco use: Secondary | ICD-10-CM | POA: Insufficient documentation

## 2014-12-13 DIAGNOSIS — R519 Headache, unspecified: Secondary | ICD-10-CM

## 2014-12-13 DIAGNOSIS — Y9289 Other specified places as the place of occurrence of the external cause: Secondary | ICD-10-CM | POA: Insufficient documentation

## 2014-12-13 DIAGNOSIS — R110 Nausea: Secondary | ICD-10-CM | POA: Diagnosis not present

## 2014-12-13 DIAGNOSIS — W000XXA Fall on same level due to ice and snow, initial encounter: Secondary | ICD-10-CM | POA: Diagnosis not present

## 2014-12-13 DIAGNOSIS — Y998 Other external cause status: Secondary | ICD-10-CM | POA: Insufficient documentation

## 2014-12-13 DIAGNOSIS — W19XXXA Unspecified fall, initial encounter: Secondary | ICD-10-CM

## 2014-12-13 DIAGNOSIS — Z8659 Personal history of other mental and behavioral disorders: Secondary | ICD-10-CM | POA: Insufficient documentation

## 2014-12-13 DIAGNOSIS — S3992XA Unspecified injury of lower back, initial encounter: Secondary | ICD-10-CM | POA: Diagnosis not present

## 2014-12-13 DIAGNOSIS — S4992XA Unspecified injury of left shoulder and upper arm, initial encounter: Secondary | ICD-10-CM | POA: Diagnosis not present

## 2014-12-13 DIAGNOSIS — S199XXA Unspecified injury of neck, initial encounter: Secondary | ICD-10-CM | POA: Diagnosis not present

## 2014-12-13 DIAGNOSIS — Y9339 Activity, other involving climbing, rappelling and jumping off: Secondary | ICD-10-CM | POA: Insufficient documentation

## 2014-12-13 DIAGNOSIS — R51 Headache: Secondary | ICD-10-CM

## 2014-12-13 LAB — I-STAT TROPONIN, ED: Troponin i, poc: 0 ng/mL (ref 0.00–0.08)

## 2014-12-13 LAB — I-STAT CHEM 8, ED
BUN: 17 mg/dL (ref 6–23)
Calcium, Ion: 1.09 mmol/L — ABNORMAL LOW (ref 1.12–1.23)
Chloride: 105 mmol/L (ref 96–112)
Creatinine, Ser: 0.8 mg/dL (ref 0.50–1.10)
Glucose, Bld: 102 mg/dL — ABNORMAL HIGH (ref 70–99)
HCT: 34 % — ABNORMAL LOW (ref 36.0–46.0)
Hemoglobin: 11.6 g/dL — ABNORMAL LOW (ref 12.0–15.0)
Potassium: 3.7 mmol/L (ref 3.5–5.1)
Sodium: 142 mmol/L (ref 135–145)
TCO2: 22 mmol/L (ref 0–100)

## 2014-12-13 MED ORDER — ONDANSETRON 4 MG PO TBDP
4.0000 mg | ORAL_TABLET | Freq: Once | ORAL | Status: AC
  Administered 2014-12-13: 4 mg via ORAL
  Filled 2014-12-13: qty 1

## 2014-12-13 MED ORDER — ACETAMINOPHEN 325 MG PO TABS
650.0000 mg | ORAL_TABLET | Freq: Once | ORAL | Status: AC
  Administered 2014-12-13: 650 mg via ORAL
  Filled 2014-12-13: qty 2

## 2014-12-13 MED ORDER — METHOCARBAMOL 500 MG PO TABS: 500.0000 mg | ORAL_TABLET | Freq: Two times a day (BID) | ORAL | Status: DC | PRN

## 2014-12-13 MED ORDER — NAPROXEN 500 MG PO TABS: 500.0000 mg | ORAL_TABLET | Freq: Two times a day (BID) | ORAL | Status: DC

## 2014-12-13 MED ORDER — ONDANSETRON 4 MG PO TBDP: 4.0000 mg | ORAL_TABLET | Freq: Three times a day (TID) | ORAL | Status: DC | PRN

## 2014-12-13 NOTE — ED Notes (Addendum)
Pt reports to ED for Fall early Tuesday morning at 0230. Possible loss of consciousness, pt does not know if she hit her head. Unable to tell us. Pt states "she feels like she hit her head." Pt just stating she hurts. Neck sore with movement. Non tender to palpate. Pt reports headaches daily that are constant.

## 2014-12-13 NOTE — ED Notes (Signed)
Pt c/o fall on ice on Monday hitting back and head and neck; pt sts pain in legs as well

## 2014-12-13 NOTE — Discharge Instructions (Signed)
General Headache Without Cause A headache is pain or discomfort felt around the head or neck area. The specific cause of a headache may not be found. There are many causes and types of headaches. A few common ones are:  Tension headaches.  Migraine headaches.  Cluster headaches.  Chronic daily headaches. HOME CARE INSTRUCTIONS   Keep all follow-up appointments with your caregiver or any specialist referral.  Only take over-the-counter or prescription medicines for pain or discomfort as directed by your caregiver.  Lie down in a dark, quiet room when you have a headache.  Keep a headache journal to find out what may trigger your migraine headaches. For example, write down:  What you eat and drink.  How much sleep you get.  Any change to your diet or medicines.  Try massage or other relaxation techniques.  Put ice packs or heat on the head and neck. Use these 3 to 4 times per day for 15 to 20 minutes each time, or as needed.  Limit stress.  Sit up straight, and do not tense your muscles.  Quit smoking if you smoke.  Limit alcohol use.  Decrease the amount of caffeine you drink, or stop drinking caffeine.  Eat and sleep on a regular schedule.  Get 7 to 9 hours of sleep, or as recommended by your caregiver.  Keep lights dim if bright lights bother you and make your headaches worse. SEEK MEDICAL CARE IF:   You have problems with the medicines you were prescribed.  Your medicines are not working.  You have a change from the usual headache.  You have nausea or vomiting. SEEK IMMEDIATE MEDICAL CARE IF:   Your headache becomes severe.  You have a fever.  You have a stiff neck.  You have loss of vision.  You have muscular weakness or loss of muscle control.  You start losing your balance or have trouble walking.  You feel faint or pass out.  You have severe symptoms that are different from your first symptoms. MAKE SURE YOU:   Understand these  instructions.  Will watch your condition.  Will get help right away if you are not doing well or get worse. Document Released: 11/02/2005 Document Revised: 01/25/2012 Document Reviewed: 11/18/2011 [facility] Patient Information 2015 [place], [place]. This information is not intended to replace advice given to you by your health care provider. Make sure you discuss any questions you have with your health care provider. Head Injury You have received a head injury. It does not appear serious at this time. Headaches and vomiting are common following head injury. It should be easy to awaken from sleeping. Sometimes it is necessary for you to stay in the emergency department for a while for observation. Sometimes admission to the hospital may be needed. After injuries such as yours, most problems occur within the first 24 hours, but side effects may occur up to 7-10 days after the injury. It is important for you to carefully monitor your condition and contact your health care provider or seek immediate medical care if there is a change in your condition. WHAT ARE THE TYPES OF HEAD INJURIES? Head injuries can be as minor as a bump. Some head injuries can be more severe. More severe head injuries include:  A jarring injury to the brain (concussion).  A bruise of the brain (contusion). This mean there is bleeding in the brain that can cause swelling.  A cracked skull (skull fracture).  Bleeding in the brain that collects, clots, and  forms a bump (hematoma). WHAT CAUSES A HEAD INJURY? A serious head injury is most likely to happen to someone who is in a car wreck and is not wearing a seat belt. Other causes of major head injuries include bicycle or motorcycle accidents, sports injuries, and falls. HOW ARE HEAD INJURIES DIAGNOSED? A complete history of the event leading to the injury and your current symptoms will be helpful in diagnosing head injuries. Many times, pictures of the brain, such as CT or MRI  are needed to see the extent of the injury. Often, an overnight hospital stay is necessary for observation.  WHEN SHOULD I SEEK IMMEDIATE MEDICAL CARE?  You should get help right away if:  You have confusion or drowsiness.  You feel sick to your stomach (nauseous) or have continued, forceful vomiting.  You have dizziness or unsteadiness that is getting worse.  You have severe, continued headaches not relieved by medicine. Only take over-the-counter or prescription medicines for pain, fever, or discomfort as directed by your health care provider.  You do not have normal function of the arms or legs or are unable to walk.  You notice changes in the black spots in the center of the colored part of your eye (pupil).  You have a clear or bloody fluid coming from your nose or ears.  You have a loss of vision. During the next 24 hours after the injury, you must stay with someone who can watch you for the warning signs. This person should contact local emergency services (911 in the [place] if you have seizures, you become unconscious, or you are unable to wake up. HOW CAN I PREVENT A HEAD INJURY IN THE FUTURE? The most important factor for preventing major head injuries is avoiding motor vehicle accidents. To minimize the potential for damage to your head, it is crucial to wear seat belts while riding in motor vehicles. Wearing helmets while bike riding and playing collision sports (like football) is also helpful. Also, avoiding dangerous activities around the house will further help reduce your risk of head injury.  WHEN CAN I RETURN TO NORMAL ACTIVITIES AND ATHLETICS? You should be reevaluated by your health care provider before returning to these activities. If you have any of the following symptoms, you should not return to activities or contact sports until 1 week after the symptoms have stopped:  Persistent headache.  Dizziness or vertigo.  Poor attention and  concentration.  Confusion.  Memory problems.  Nausea or vomiting.  Fatigue or tire easily.  Irritability.  Intolerant of bright lights or loud noises.  Anxiety or depression.  Disturbed sleep. MAKE SURE YOU:   Understand these instructions.  Will watch your condition.  Will get help right away if you are not doing well or get worse. Document Released: 11/02/2005 Document Revised: 11/07/2013 Document Reviewed: 07/10/2013 [facility] Patient Information 2015 [place], [place]. This information is not intended to replace advice given to you by your health care provider. Make sure you discuss any questions you have with your health care provider.

## 2014-12-13 NOTE — ED Notes (Signed)
Patient transported to CT 

## 2014-12-13 NOTE — ED Provider Notes (Signed)
CSN: [id number]     Arrival date & time 12/13/14  1734 History   First MD Initiated Contact with Patient 12/13/14 1801     Chief Complaint  Patient presents with  . Fall    Emma Turner is a 49 y.o. female with a history of hypertension, depression and bipolar disorder who presents the emergency department complaining of headache after a slip and fall on ice 4 days ago. Patient reports she was climbing out of her truck when she slipped on ice falling on her side. She is unsure about loss of consciousness or hitting her head. Patient reports she was evaluated in urgent care where they evaluated her including x-rays. Patient reports she is here today because her headache has been constant since the fall. Patient reports a 9 out of 10 headache worse in her right posterior head. Patient also complains of left neck soreness, mid back soreness, left side is soreness and left arm soreness. She also reports some nausea without vomiting. Patient reports her blood pressures have been elevated recently and has not been taking any blood pressure medicines. Patient denies ever being prescribed a blood pressure medicine. Patient reports taking Advil at 12 PM today with minimal relief. The patient denies fevers, chills, numbness, tingling, weakness, abdominal pain, vomiting, diarrhea, rashes, dysuria, hematuria, loss of coordination or balance.  (Consider location/radiation/quality/duration/timing/severity/associated sxs/prior Treatment) HPI  Past Medical History  Diagnosis Date  . Hypertension   . Depression   . Bipolar 1 disorder    Past Surgical History  Procedure Laterality Date  . Foot surgery    . Ectopic pregnancy surgery     History reviewed. No pertinent family history. History  Substance Use Topics  . Smoking status: Current Every Day Smoker -- 2.00 packs/day    Types: Cigarettes  . Smokeless tobacco: Not on file  . Alcohol Use: No   OB History    No data available     Review of Systems   Constitutional: Negative for fever and chills.  HENT: Negative for congestion, ear pain and sore throat.   Eyes: Negative for pain and visual disturbance.  Respiratory: Negative for cough, shortness of breath and wheezing.   Cardiovascular: Negative for chest pain and palpitations.  Gastrointestinal: Positive for nausea. Negative for vomiting, abdominal pain and diarrhea.  Genitourinary: Negative for dysuria, urgency, frequency, hematuria and difficulty urinating.  Musculoskeletal: Positive for myalgias, back pain and neck stiffness. Negative for neck pain.  Skin: Negative for rash and wound.  Neurological: Positive for headaches. Negative for dizziness, weakness, light-headedness and numbness.      Allergies  Review of patient's allergies indicates no known allergies.  Home Medications   Prior to Admission medications   Medication Sig Start Date End Date Taking? Authorizing Provider  HYDROcodone-acetaminophen (NORCO/VICODIN) 5-325 MG per tablet Take 1-2 tablets by mouth every 6 (six) hours as needed for pain. 06/09/13   Heather Laisure, PA-C  Ibuprofen-Diphenhydramine HCl (ADVIL PM) 200-25 MG CAPS Take 1 tablet by mouth at bedtime as needed (for sleep).    Historical Provider, MD  methocarbamol (ROBAXIN) 500 MG tablet Take 1 tablet (500 mg total) by mouth 2 (two) times daily as needed for muscle spasms. 12/13/14   Verda Cumins Layali Freund, PA-C  naproxen (NAPROSYN) 500 MG tablet Take 1 tablet (500 mg total) by mouth 2 (two) times daily with a meal. 12/13/14   Verda Cumins Isaic Syler, PA-C  ondansetron (ZOFRAN ODT) 4 MG disintegrating tablet Take 1 tablet (4 mg total) by mouth every 8 (  eight) hours as needed for nausea or vomiting. 12/13/14   Verda Cumins Basil Blakesley, PA-C   BP 153/90 mmHg  Pulse 66  Temp(Src) 98.3 F (36.8 C) (Oral)  Resp 16  SpO2 99%  LMP 11/29/2014 Physical Exam  Constitutional: She is oriented to person, place, and time. She appears well-developed and well-nourished. No  distress.  HENT:  Head: Normocephalic and atraumatic.  Right Ear: External ear normal.  Left Ear: External ear normal.  Nose: Nose normal.  Mouth/Throat: Oropharynx is clear and moist. No oropharyngeal exudate.  Eyes: Conjunctivae and EOM are normal. Pupils are equal, round, and reactive to light. Right eye exhibits no discharge. Left eye exhibits no discharge.  Neck: Normal range of motion. Neck supple.  Patient's right neck musculature is mildly tender to palpation. No bony point tenderness. No crepitus or step-offs noted.  Cardiovascular: Normal rate, regular rhythm, normal heart sounds and intact distal pulses.  Exam reveals no gallop and no friction rub.   No murmur heard. Pulmonary/Chest: Effort normal and breath sounds normal. No respiratory distress. She has no wheezes. She has no rales.  Abdominal: Soft. She exhibits no distension. There is no tenderness.  Musculoskeletal: Normal range of motion. She exhibits no edema.  Patient is spontaneously moving all extremities in a coordinated fashion exhibiting good strength. Strength is 5 out of 5 in her bilateral upper and lower extremities. The patient is able to ambulate without difficulty or assistance. Patient has no back bony point tenderness, crepitus, step-offs or edema.  Lymphadenopathy:    She has no cervical adenopathy.  Neurological: She is alert and oriented to person, place, and time. No cranial nerve deficit. Coordination normal.  Cranial nerves intact. EOMs intact bilaterally. Patient is good sensation bilateral face.  Skin: Skin is warm and dry. No rash noted. She is not diaphoretic. No erythema. No pallor.  No ecchymosis or deformities noted.   Psychiatric: She has a normal mood and affect. Her behavior is normal.  Nursing note and vitals reviewed.   ED Course  Procedures (including critical care time) Labs Review Labs Reviewed  I-STAT CHEM 8, ED - Abnormal; Notable for the following:    Glucose, Bld 102 (*)     Calcium, Ion 1.09 (*)    Hemoglobin 11.6 (*)    HCT 34.0 (*)    All other components within normal limits  I-STAT TROPOININ, ED    Imaging Review Ct Head Wo Contrast  12/13/2014   CLINICAL DATA:  Headache after fall  EXAM: CT HEAD WITHOUT CONTRAST  TECHNIQUE: Contiguous axial images were obtained from the base of the skull through the vertex without intravenous contrast.  COMPARISON:  02/06/2011  FINDINGS: There is no intracranial hemorrhage or extra-axial fluid collection.  The brain and CSF spaces appear unremarkable.  The calvarium and skullbase are intact.  There is no significant interval change.  IMPRESSION: No acute findings   Electronically Signed   By: Andreas Newport M.D.   On: 12/13/2014 19:43     EKG Interpretation   Date/Time:  Thursday December 13 2014 19:00:45 EST Ventricular Rate:  77 PR Interval:  123 QRS Duration: 100 QT Interval:  405 QTC Calculation: 458 R Axis:   87 Text Interpretation:  Sinus rhythm No significant change since last  tracing Confirmed by HARRISON  MD, FORREST ([id number]) on 12/13/2014 7:02:21 PM      Filed Vitals:   12/13/14 1749 12/13/14 1844 12/13/14 2026  BP: 185/102 159/91 153/90  Pulse: 95 75 66  Temp: 98.9  F (37.2 C)  98.3 F (36.8 C)  TempSrc: Oral  Oral  Resp: 20 18 16   SpO2: 95% 98% 99%     MDM   Meds given in ED:  Medications  acetaminophen (TYLENOL) tablet 650 mg (650 mg Oral Given 12/13/14 1807)  ondansetron (ZOFRAN-ODT) disintegrating tablet 4 mg (4 mg Oral Given 12/13/14 2043)    Discharge Medication List as of 12/13/2014  8:33 PM    START taking these medications   Details  methocarbamol (ROBAXIN) 500 MG tablet Take 1 tablet (500 mg total) by mouth 2 (two) times daily as needed for muscle spasms., Starting 12/13/2014, Until Discontinued, Print    naproxen (NAPROSYN) 500 MG tablet Take 1 tablet (500 mg total) by mouth 2 (two) times daily with a meal., Starting 12/13/2014, Until Discontinued, Print    ondansetron  (ZOFRAN ODT) 4 MG disintegrating tablet Take 1 tablet (4 mg total) by mouth every 8 (eight) hours as needed for nausea or vomiting., Starting 12/13/2014, Until Discontinued, Print        Final diagnoses:  Headache  Fall, initial encounter   This is a 49 year old female who presents the emergency department complaining of a headache and soreness after a slip and fall on ice 4 days ago. Patient has had a constant headache since her fall. Patient is afebrile and nontoxic appearing. The patient is neurologically intact. Patient is able to ambulate without difficulty or assistance. There is no evidence of ecchymosis, deformity or edema. Due to the patient not being sure if she lost consciousness a CT of her head was obtained. This was negative. Due to the patient's elevated blood pressure will also check basic blood work. Chem-8 was unremarkable. Troponin was negative. Patient reports improvement of her headache with Tylenol. Patient is driving and does not wish for any narcotic pain medicine. Patient does report she is has some residual nausea and would like something for nausea prior to discharge. Prior to discharge the patient's blood pressures improved to 153/90. Patient discharged with prescriptions for Robaxin, naproxen and Zofran. Head injury precautions are provided. Strict return precautions provided. I advised the patient to follow-up with their primary care provider this week. I advised the patient to return to the emergency department with new or worsening symptoms or new concerns. The patient verbalized understanding and agreement with plan.   This patient was discussed with and evaluated by Dr. Aline Brochure who agrees with assessment and plan.    Hanley Hays, PA-C 12/14/14 9924  Pamella Pert, MD 12/15/14 [phone]

## 2016-08-31 DIAGNOSIS — R0609 Other forms of dyspnea: Secondary | ICD-10-CM | POA: Insufficient documentation

## 2016-08-31 DIAGNOSIS — Z791 Long term (current) use of non-steroidal anti-inflammatories (NSAID): Secondary | ICD-10-CM | POA: Insufficient documentation

## 2016-08-31 DIAGNOSIS — R0600 Dyspnea, unspecified: Secondary | ICD-10-CM | POA: Insufficient documentation

## 2016-08-31 DIAGNOSIS — N938 Other specified abnormal uterine and vaginal bleeding: Secondary | ICD-10-CM | POA: Insufficient documentation

## 2016-10-16 DIAGNOSIS — Z9289 Personal history of other medical treatment: Secondary | ICD-10-CM

## 2016-10-16 HISTORY — DX: Personal history of other medical treatment: Z92.89

## 2017-03-16 DIAGNOSIS — I219 Acute myocardial infarction, unspecified: Secondary | ICD-10-CM

## 2017-03-16 HISTORY — DX: Acute myocardial infarction, unspecified: I21.9

## 2017-04-03 ENCOUNTER — Encounter (HOSPITAL_COMMUNITY): Payer: Self-pay | Admitting: Family Medicine

## 2017-04-03 ENCOUNTER — Ambulatory Visit (HOSPITAL_COMMUNITY)
Admission: EM | Admit: 2017-04-03 | Discharge: 2017-04-03 | Disposition: A | Discharge status: Home / Self Care | Payer: 59 | Attending: Internal Medicine | Admitting: Internal Medicine

## 2017-04-03 DIAGNOSIS — R030 Elevated blood-pressure reading, without diagnosis of hypertension: Secondary | ICD-10-CM | POA: Diagnosis not present

## 2017-04-03 MED ORDER — TRIAMTERENE-HCTZ 37.5-25 MG PO CAPS: 1.0000 | ORAL_CAPSULE | Freq: Every day | ORAL | 0 refills | Status: DC

## 2017-04-03 NOTE — ED Triage Notes (Signed)
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 Reports 180/92.

## 2017-04-03 NOTE — ED Provider Notes (Signed)
[place]    CSN: [id number] Arrival date & time: 04/03/17  1824     History   Chief Complaint Chief Complaint  Patient presents with  . Hypertension    HPI Emma Turner is a 51 y.o. female. 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 Not taking any medication, and does not want to be. No chest discomfort, breathlessness, leg swelling expressed. Also gives a history of vaginal bleeding due to fibroids, with anemia. Says she sleeps well at night and has no excessive daytime sleepiness. She does not have a PCP.    HPI  Past Medical History:  Diagnosis Date  . Bipolar 1 disorder ([place])   . Depression   . Hypertension     Patient Active Problem List   Diagnosis Date Noted  . PTSD (post-traumatic stress disorder) 06/17/2013  . Legal circumstances 06/17/2013  . Homeless 06/17/2013    Past Surgical History:  Procedure Laterality Date  . ECTOPIC PREGNANCY SURGERY    . FOOT SURGERY       Home Medications    Prior to Admission medications   Medication Sig Start Date End Date Taking? Authorizing Provider  HYDROcodone-acetaminophen (NORCO/VICODIN) 5-325 MG per tablet Take 1-2 tablets by mouth every 6 (six) hours as needed for pain. 06/09/13   Hyman Bible, PA-C  Ibuprofen-Diphenhydramine HCl (ADVIL PM) 200-25 MG CAPS Take 1 tablet by mouth at bedtime as needed (for sleep).    [provider]  methocarbamol (ROBAXIN) 500 MG tablet Take 1 tablet (500 mg total) by mouth 2 (two) times daily as needed for muscle spasms. 12/13/14   Waynetta Pean, PA-C  naproxen (NAPROSYN) 500 MG tablet Take 1 tablet (500 mg total) by mouth 2 (two) times daily with a meal. 12/13/14   Waynetta Pean, PA-C  ondansetron (ZOFRAN ODT) 4 MG disintegrating tablet Take 1 tablet (4 mg total) by mouth every 8 (eight) hours as needed for nausea or vomiting. 12/13/14   Waynetta Pean, PA-C  triamterene-hydrochlorothiazide (DYAZIDE) 37.5-25 MG  capsule Take 1 each (1 capsule total) by mouth daily. 04/03/17   Sherlene Shams, MD    Family History History reviewed. No pertinent family history.  Social History Social History  Substance Use Topics  . Smoking status: Current Every Day Smoker    Packs/day: 2.00    Types: Cigarettes  . Smokeless tobacco: Never Used  . Alcohol use No     Allergies   Patient has no known allergies.   Review of Systems Review of Systems  All other systems reviewed and are negative.    Physical Exam Triage Vital Signs ED Triage Vitals [04/03/17 1838]  Enc Vitals Group     BP (!) 173/99     Pulse Rate 93     Resp 18     Temp 98.4 F (36.9 C)     Temp src      SpO2 98 %     Weight      Height      Pain Score      Pain Loc    Updated Vital Signs BP (!) 173/99   Pulse 93   Temp 98.4 F (36.9 C)   Resp 18   SpO2 98%   Physical Exam  Constitutional: She is oriented to person, place, and time. No distress.  HENT:  Head: Atraumatic.  Eyes:  Conjugate gaze observed, no eye redness/discharge  Neck: Neck supple.  Cardiovascular: Normal rate and regular rhythm.  Pulmonary/Chest: No respiratory distress. She has no wheezes. She has no rales.  Lungs clear, symmetric breath sounds  Abdominal: She exhibits no distension.  Musculoskeletal: Normal range of motion. She exhibits no edema.  Neurological: She is alert and oriented to person, place, and time.  Skin: Skin is warm and dry.  Nursing note and vitals reviewed.    UC Treatments / Results   Procedures Procedures (including critical care time) None today  Final Clinical Impressions(s) / UC Diagnoses   Final diagnoses:  Elevated blood pressure reading   Keep a record of your blood pressure readings at home, to show to your primary care provider.  Make an appointment with a primary care provider, to help with managing blood pressure.  Slow-fe is a well-tolerated otc iron supplement that will give you the ingredients to  make more blood, if you have ongoing bleeding issues.  Not everyone with sleep apnea is aware that they are not sleeping well at night.  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  New Prescriptions Discharge Medication List as of 04/03/2017  7:20 PM    START taking these medications   Details  triamterene-hydrochlorothiazide (DYAZIDE) 37.5-25 MG capsule Take 1 each (1 capsule total) by mouth daily., Starting Sat 04/03/2017, Normal         Sherlene Shams, MD 04/03/17 2059

## 2017-04-03 NOTE — Discharge Instructions (Addendum)
Keep a record of your blood pressure readings at home, to show to your primary care provider.  Make an appointment with a primary care provider, to help with managing blood pressure.  Slow-fe is a well-tolerated otc iron supplement that will give you the ingredients to make more blood, if you have ongoing bleeding issues.  Not everyone with sleep apnea is aware that they are not sleeping well at night.  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

## 2017-04-08 ENCOUNTER — Encounter (HOSPITAL_COMMUNITY)
Admission: EM | Disposition: A | Discharge status: Home / Self Care | Payer: Self-pay | Source: Home / Self Care | Attending: Nephrology

## 2017-04-08 ENCOUNTER — Emergency Department (HOSPITAL_COMMUNITY): Payer: 59

## 2017-04-08 ENCOUNTER — Other Ambulatory Visit: Payer: Self-pay

## 2017-04-08 ENCOUNTER — Inpatient Hospital Stay (HOSPITAL_COMMUNITY)
Admission: EM | Admit: 2017-04-08 | Discharge: 2017-04-10 | DRG: 247 | Disposition: A | Discharge status: Home / Self Care | Payer: 59 | Attending: Nephrology | Admitting: Nephrology

## 2017-04-08 ENCOUNTER — Encounter (HOSPITAL_COMMUNITY): Payer: Self-pay

## 2017-04-08 DIAGNOSIS — R079 Chest pain, unspecified: Secondary | ICD-10-CM | POA: Insufficient documentation

## 2017-04-08 DIAGNOSIS — N920 Excessive and frequent menstruation with regular cycle: Secondary | ICD-10-CM | POA: Diagnosis present

## 2017-04-08 DIAGNOSIS — I200 Unstable angina: Secondary | ICD-10-CM | POA: Diagnosis not present

## 2017-04-08 DIAGNOSIS — E876 Hypokalemia: Secondary | ICD-10-CM | POA: Diagnosis not present

## 2017-04-08 DIAGNOSIS — I25110 Atherosclerotic heart disease of native coronary artery with unstable angina pectoris: Secondary | ICD-10-CM | POA: Diagnosis not present

## 2017-04-08 DIAGNOSIS — D259 Leiomyoma of uterus, unspecified: Secondary | ICD-10-CM | POA: Diagnosis present

## 2017-04-08 DIAGNOSIS — F17210 Nicotine dependence, cigarettes, uncomplicated: Secondary | ICD-10-CM | POA: Diagnosis present

## 2017-04-08 DIAGNOSIS — Z720 Tobacco use: Secondary | ICD-10-CM

## 2017-04-08 DIAGNOSIS — I10 Essential (primary) hypertension: Secondary | ICD-10-CM | POA: Diagnosis not present

## 2017-04-08 DIAGNOSIS — Z7982 Long term (current) use of aspirin: Secondary | ICD-10-CM

## 2017-04-08 DIAGNOSIS — D500 Iron deficiency anemia secondary to blood loss (chronic): Secondary | ICD-10-CM | POA: Diagnosis not present

## 2017-04-08 DIAGNOSIS — Z6838 Body mass index (BMI) 38.0-38.9, adult: Secondary | ICD-10-CM

## 2017-04-08 DIAGNOSIS — Z9119 Patient's noncompliance with other medical treatment and regimen: Secondary | ICD-10-CM

## 2017-04-08 DIAGNOSIS — F411 Generalized anxiety disorder: Secondary | ICD-10-CM | POA: Diagnosis present

## 2017-04-08 DIAGNOSIS — Z8051 Family history of malignant neoplasm of kidney: Secondary | ICD-10-CM

## 2017-04-08 DIAGNOSIS — I2510 Atherosclerotic heart disease of native coronary artery without angina pectoris: Secondary | ICD-10-CM | POA: Diagnosis not present

## 2017-04-08 DIAGNOSIS — T465X6A Underdosing of other antihypertensive drugs, initial encounter: Secondary | ICD-10-CM | POA: Diagnosis present

## 2017-04-08 DIAGNOSIS — Z8249 Family history of ischemic heart disease and other diseases of the circulatory system: Secondary | ICD-10-CM

## 2017-04-08 DIAGNOSIS — F40240 Claustrophobia: Secondary | ICD-10-CM | POA: Diagnosis present

## 2017-04-08 DIAGNOSIS — G4733 Obstructive sleep apnea (adult) (pediatric): Secondary | ICD-10-CM | POA: Diagnosis present

## 2017-04-08 DIAGNOSIS — F319 Bipolar disorder, unspecified: Secondary | ICD-10-CM | POA: Diagnosis present

## 2017-04-08 DIAGNOSIS — Z91128 Patient's intentional underdosing of medication regimen for other reason: Secondary | ICD-10-CM

## 2017-04-08 DIAGNOSIS — R0789 Other chest pain: Secondary | ICD-10-CM

## 2017-04-08 DIAGNOSIS — I2583 Coronary atherosclerosis due to lipid rich plaque: Secondary | ICD-10-CM | POA: Diagnosis not present

## 2017-04-08 DIAGNOSIS — Z79899 Other long term (current) drug therapy: Secondary | ICD-10-CM

## 2017-04-08 DIAGNOSIS — F1290 Cannabis use, unspecified, uncomplicated: Secondary | ICD-10-CM | POA: Diagnosis present

## 2017-04-08 DIAGNOSIS — Z818 Family history of other mental and behavioral disorders: Secondary | ICD-10-CM

## 2017-04-08 DIAGNOSIS — E669 Obesity, unspecified: Secondary | ICD-10-CM | POA: Diagnosis present

## 2017-04-08 DIAGNOSIS — D509 Iron deficiency anemia, unspecified: Secondary | ICD-10-CM | POA: Diagnosis present

## 2017-04-08 DIAGNOSIS — Z823 Family history of stroke: Secondary | ICD-10-CM

## 2017-04-08 DIAGNOSIS — Y929 Unspecified place or not applicable: Secondary | ICD-10-CM

## 2017-04-08 DIAGNOSIS — Z955 Presence of coronary angioplasty implant and graft: Secondary | ICD-10-CM

## 2017-04-08 HISTORY — PX: LEFT HEART CATH AND CORONARY ANGIOGRAPHY: CATH118249

## 2017-04-08 HISTORY — DX: Atherosclerotic heart disease of native coronary artery without angina pectoris: I25.10

## 2017-04-08 HISTORY — PX: CORONARY STENT INTERVENTION: CATH118234

## 2017-04-08 HISTORY — PX: INTRAVASCULAR PRESSURE WIRE/FFR STUDY: CATH118243

## 2017-04-08 LAB — CBC
HCT: 35.1 % — ABNORMAL LOW (ref 36.0–46.0)
Hemoglobin: 10 g/dL — ABNORMAL LOW (ref 12.0–15.0)
MCH: 20.9 pg — ABNORMAL LOW (ref 26.0–34.0)
MCHC: 28.5 g/dL — ABNORMAL LOW (ref 30.0–36.0)
MCV: 73.3 fL — ABNORMAL LOW (ref 78.0–100.0)
PLATELETS: 302 10*3/uL (ref 150–400)
RBC: 4.79 MIL/uL (ref 3.87–5.11)
RDW: 16.8 % — ABNORMAL HIGH (ref 11.5–15.5)
WBC: 7.8 10*3/uL (ref 4.0–10.5)

## 2017-04-08 LAB — TROPONIN I: Troponin I: <0.03 ng/mL (ref <0.03)

## 2017-04-08 LAB — RAPID URINE DRUG SCREEN, HOSP PERFORMED
Amphetamines: NOT DETECTED
BARBITURATES: NOT DETECTED
BENZODIAZEPINES: NOT DETECTED
COCAINE: NOT DETECTED
OPIATES: NOT DETECTED
Tetrahydrocannabinol: NOT DETECTED

## 2017-04-08 LAB — PROTIME-INR
INR: 1.01
Prothrombin Time: 13.3 seconds (ref 11.4–15.2)

## 2017-04-08 LAB — BASIC METABOLIC PANEL
Anion gap: 8 (ref 5–15)
BUN: 12 mg/dL (ref 6–20)
CHLORIDE: 105 mmol/L (ref 101–111)
CO2: 25 mmol/L (ref 22–32)
Calcium: 9 mg/dL (ref 8.9–10.3)
Creatinine, Ser: 0.88 mg/dL (ref 0.44–1.00)
GFR calc Af Amer: >60 mL/min (ref >60)
GFR calc non Af Amer: >60 mL/min (ref >60)
GLUCOSE: 142 mg/dL — AB (ref 65–99)
POTASSIUM: 3.4 mmol/L — AB (ref 3.5–5.1)
Sodium: 138 mmol/L (ref 135–145)

## 2017-04-08 LAB — I-STAT TROPONIN, ED: Troponin i, poc: 0 ng/mL (ref 0.00–0.08)

## 2017-04-08 LAB — HIV ANTIBODY (ROUTINE TESTING W REFLEX): HIV Screen 4th Generation wRfx: NONREACTIVE

## 2017-04-08 LAB — POCT ACTIVATED CLOTTING TIME
ACTIVATED CLOTTING TIME: 219 s
Activated Clotting Time: 241 seconds

## 2017-04-08 LAB — BRAIN NATRIURETIC PEPTIDE: B Natriuretic Peptide: 31.4 pg/mL (ref 0.0–100.0)

## 2017-04-08 LAB — MAGNESIUM: MAGNESIUM: 2 mg/dL (ref 1.7–2.4)

## 2017-04-08 SURGERY — LEFT HEART CATH AND CORONARY ANGIOGRAPHY
Anesthesia: LOCAL | Laterality: Right

## 2017-04-08 MED ORDER — LIDOCAINE HCL (PF) 1 % IJ SOLN
INTRAMUSCULAR | Status: DC | PRN
  Administered 2017-04-08: 3 mL via INTRADERMAL

## 2017-04-08 MED ORDER — TICAGRELOR 90 MG PO TABS
ORAL_TABLET | ORAL | Status: DC | PRN
  Administered 2017-04-08: 180 mg via ORAL

## 2017-04-08 MED ORDER — LIDOCAINE HCL 1 % IJ SOLN
INTRAMUSCULAR | Status: AC
  Filled 2017-04-08: qty 20

## 2017-04-08 MED ORDER — HEPARIN SODIUM (PORCINE) 1000 UNIT/ML IJ SOLN
INTRAMUSCULAR | Status: AC
  Filled 2017-04-08: qty 1

## 2017-04-08 MED ORDER — NITROGLYCERIN 1 MG/10 ML FOR IR/CATH LAB
INTRA_ARTERIAL | Status: AC
  Filled 2017-04-08: qty 10

## 2017-04-08 MED ORDER — IOPAMIDOL (ISOVUE-370) INJECTION 76%
INTRAVENOUS | Status: AC
  Filled 2017-04-08: qty 100

## 2017-04-08 MED ORDER — ASPIRIN 81 MG PO CHEW: 81.0000 mg | CHEWABLE_TABLET | ORAL | Status: AC

## 2017-04-08 MED ORDER — ADENOSINE 12 MG/4ML IV SOLN
INTRAVENOUS | Status: AC
  Filled 2017-04-08: qty 16

## 2017-04-08 MED ORDER — IOPAMIDOL (ISOVUE-370) INJECTION 76%
INTRAVENOUS | Status: DC | PRN
  Administered 2017-04-08: 115 mL via INTRA_ARTERIAL

## 2017-04-08 MED ORDER — HEPARIN (PORCINE) IN NACL 2-0.9 UNIT/ML-% IJ SOLN
INTRAMUSCULAR | Status: AC | PRN
  Administered 2017-04-08: 1000 mL

## 2017-04-08 MED ORDER — NITROGLYCERIN 0.4 MG SL SUBL
0.4000 mg | SUBLINGUAL_TABLET | SUBLINGUAL | Status: DC | PRN
Start: 2017-04-08 — End: 2017-04-08
  Administered 2017-04-08 (×2): 0.4 mg via SUBLINGUAL
  Filled 2017-04-08 (×2): qty 1

## 2017-04-08 MED ORDER — HYDRALAZINE HCL 20 MG/ML IJ SOLN
5.0000 mg | INTRAMUSCULAR | Status: AC | PRN
  Administered 2017-04-08: 5 mg via INTRAVENOUS
  Filled 2017-04-08: qty 1

## 2017-04-08 MED ORDER — ACETAMINOPHEN 500 MG PO TABS
1000.0000 mg | ORAL_TABLET | Freq: Once | ORAL | Status: AC
Start: 2017-04-08 — End: 2017-04-08
  Administered 2017-04-08: 1000 mg via ORAL
  Filled 2017-04-08: qty 2

## 2017-04-08 MED ORDER — ONDANSETRON HCL 4 MG/2ML IJ SOLN
4.0000 mg | Freq: Four times a day (QID) | INTRAMUSCULAR | Status: DC | PRN
  Administered 2017-04-08: 4 mg via INTRAVENOUS
  Filled 2017-04-08: qty 2

## 2017-04-08 MED ORDER — NITROGLYCERIN 1 MG/10 ML FOR IR/CATH LAB
INTRA_ARTERIAL | Status: DC | PRN
  Administered 2017-04-08: 200 ug via INTRACORONARY
  Administered 2017-04-08: 150 ug via INTRACORONARY

## 2017-04-08 MED ORDER — ASPIRIN 81 MG PO CHEW
324.0000 mg | CHEWABLE_TABLET | Freq: Once | ORAL | Status: AC
Start: 2017-04-08 — End: 2017-04-08
  Administered 2017-04-08: 324 mg via ORAL
  Filled 2017-04-08: qty 4

## 2017-04-08 MED ORDER — SODIUM CHLORIDE 0.9% FLUSH: 3.0000 mL | INTRAVENOUS | Status: DC | PRN

## 2017-04-08 MED ORDER — SODIUM CHLORIDE 0.9 % IV SOLN: 250.0000 mL | INTRAVENOUS | Status: DC | PRN

## 2017-04-08 MED ORDER — ALPRAZOLAM 0.5 MG PO TABS
0.5000 mg | ORAL_TABLET | Freq: Two times a day (BID) | ORAL | Status: DC | PRN
  Administered 2017-04-08 – 2017-04-09 (×4): 0.5 mg via ORAL
  Filled 2017-04-08 (×4): qty 1

## 2017-04-08 MED ORDER — LABETALOL HCL 5 MG/ML IV SOLN: 10.0000 mg | INTRAVENOUS | Status: AC | PRN

## 2017-04-08 MED ORDER — ACETAMINOPHEN 325 MG PO TABS
650.0000 mg | ORAL_TABLET | ORAL | Status: DC | PRN
  Administered 2017-04-09 (×3): 650 mg via ORAL
  Filled 2017-04-08 (×3): qty 2

## 2017-04-08 MED ORDER — HYDROCHLOROTHIAZIDE 12.5 MG PO CAPS
12.5000 mg | ORAL_CAPSULE | Freq: Every day | ORAL | Status: DC
  Administered 2017-04-09 – 2017-04-10 (×2): 12.5 mg via ORAL
  Filled 2017-04-08 (×2): qty 1

## 2017-04-08 MED ORDER — ANGIOPLASTY BOOK
Freq: Once | Status: AC
  Administered 2017-04-08: 21:00:00
  Filled 2017-04-08: qty 1

## 2017-04-08 MED ORDER — TICAGRELOR 90 MG PO TABS
ORAL_TABLET | ORAL | Status: AC
  Filled 2017-04-08: qty 2

## 2017-04-08 MED ORDER — SODIUM CHLORIDE 0.9% FLUSH: 3.0000 mL | Freq: Two times a day (BID) | INTRAVENOUS | Status: DC

## 2017-04-08 MED ORDER — NICOTINE 7 MG/24HR TD PT24
7.0000 mg | MEDICATED_PATCH | Freq: Every day | TRANSDERMAL | Status: DC
  Administered 2017-04-08 – 2017-04-10 (×3): 7 mg via TRANSDERMAL
  Filled 2017-04-08 (×3): qty 1

## 2017-04-08 MED ORDER — MIDAZOLAM HCL 2 MG/2ML IJ SOLN
INTRAMUSCULAR | Status: AC
  Filled 2017-04-08: qty 2

## 2017-04-08 MED ORDER — FERROUS SULFATE 325 (65 FE) MG PO TABS
325.0000 mg | ORAL_TABLET | ORAL | Status: DC
  Filled 2017-04-08: qty 1

## 2017-04-08 MED ORDER — SODIUM CHLORIDE 0.9% FLUSH
3.0000 mL | Freq: Two times a day (BID) | INTRAVENOUS | Status: DC
  Administered 2017-04-08 – 2017-04-09 (×3): 3 mL via INTRAVENOUS

## 2017-04-08 MED ORDER — TICAGRELOR 90 MG PO TABS
90.0000 mg | ORAL_TABLET | Freq: Two times a day (BID) | ORAL | Status: DC
  Administered 2017-04-09 – 2017-04-10 (×4): 90 mg via ORAL
  Filled 2017-04-08 (×4): qty 1

## 2017-04-08 MED ORDER — HYDROCHLOROTHIAZIDE 25 MG PO TABS: 12.5000 mg | ORAL_TABLET | Freq: Every day | ORAL | Status: DC

## 2017-04-08 MED ORDER — GI COCKTAIL ~~LOC~~
30.0000 mL | Freq: Four times a day (QID) | ORAL | Status: DC | PRN
  Administered 2017-04-08: 30 mL via ORAL
  Filled 2017-04-08: qty 30

## 2017-04-08 MED ORDER — SODIUM CHLORIDE 0.9 % IV SOLN: INTRAVENOUS | Status: AC

## 2017-04-08 MED ORDER — HEPARIN (PORCINE) IN NACL 2-0.9 UNIT/ML-% IJ SOLN
INTRAMUSCULAR | Status: AC
  Filled 2017-04-08: qty 1000

## 2017-04-08 MED ORDER — VERAPAMIL HCL 2.5 MG/ML IV SOLN
INTRAVENOUS | Status: DC | PRN
  Administered 2017-04-08: 10 mL via INTRA_ARTERIAL

## 2017-04-08 MED ORDER — IOPAMIDOL (ISOVUE-370) INJECTION 76%
INTRAVENOUS | Status: AC
  Filled 2017-04-08: qty 50

## 2017-04-08 MED ORDER — FENTANYL CITRATE (PF) 100 MCG/2ML IJ SOLN
INTRAMUSCULAR | Status: DC | PRN
  Administered 2017-04-08: 50 ug via INTRAVENOUS
  Administered 2017-04-08: 25 ug via INTRAVENOUS

## 2017-04-08 MED ORDER — ADENOSINE (DIAGNOSTIC) 140MCG/KG/MIN
INTRAVENOUS | Status: DC | PRN
  Administered 2017-04-08: 140 ug/kg/min via INTRAVENOUS

## 2017-04-08 MED ORDER — ALBUTEROL SULFATE (2.5 MG/3ML) 0.083% IN NEBU
2.5000 mg | INHALATION_SOLUTION | Freq: Four times a day (QID) | RESPIRATORY_TRACT | Status: DC | PRN
  Administered 2017-04-08 – 2017-04-09 (×2): 2.5 mg via RESPIRATORY_TRACT
  Filled 2017-04-08 (×2): qty 3

## 2017-04-08 MED ORDER — HEPARIN SODIUM (PORCINE) 1000 UNIT/ML IJ SOLN
INTRAMUSCULAR | Status: DC | PRN
  Administered 2017-04-08 (×2): 5000 [IU] via INTRAVENOUS
  Administered 2017-04-08: 3000 [IU] via INTRAVENOUS
  Administered 2017-04-08: 5000 [IU] via INTRAVENOUS

## 2017-04-08 MED ORDER — MIDAZOLAM HCL 2 MG/2ML IJ SOLN
INTRAMUSCULAR | Status: DC | PRN
Start: 2017-04-08 — End: 2017-04-08
  Administered 2017-04-08 (×2): 2 mg via INTRAVENOUS

## 2017-04-08 MED ORDER — VERAPAMIL HCL 2.5 MG/ML IV SOLN
INTRAVENOUS | Status: AC
  Filled 2017-04-08: qty 2

## 2017-04-08 MED ORDER — SODIUM CHLORIDE 0.9 % WEIGHT BASED INFUSION
3.0000 mL/kg/h | INTRAVENOUS | Status: AC
  Administered 2017-04-08: 3 mL/kg/h via INTRAVENOUS

## 2017-04-08 MED ORDER — POTASSIUM CHLORIDE CRYS ER 20 MEQ PO TBCR
20.0000 meq | EXTENDED_RELEASE_TABLET | Freq: Once | ORAL | Status: AC
  Administered 2017-04-08: 20 meq via ORAL
  Filled 2017-04-08: qty 1

## 2017-04-08 MED ORDER — FENTANYL CITRATE (PF) 100 MCG/2ML IJ SOLN
INTRAMUSCULAR | Status: AC
  Filled 2017-04-08: qty 2

## 2017-04-08 MED ORDER — ASPIRIN EC 81 MG PO TBEC
81.0000 mg | DELAYED_RELEASE_TABLET | Freq: Every day | ORAL | Status: DC
  Administered 2017-04-08 – 2017-04-10 (×3): 81 mg via ORAL
  Filled 2017-04-08 (×3): qty 1

## 2017-04-08 MED ORDER — SODIUM CHLORIDE 0.9 % WEIGHT BASED INFUSION: 1.0000 mL/kg/h | INTRAVENOUS | Status: DC

## 2017-04-08 SURGICAL SUPPLY — 21 items
BALLN ~~LOC~~ EUPHORA RX 2.75X12 (BALLOONS) ×3
BALLOON ~~LOC~~ EUPHORA RX 2.75X12 (BALLOONS) ×2 IMPLANT
CATH EXPO 5F FL3.5 (CATHETERS) ×3 IMPLANT
CATH INFINITI 5FR ANG PIGTAIL (CATHETERS) ×3 IMPLANT
CATH INFINITI JR4 5F (CATHETERS) ×3 IMPLANT
CATH LAUNCHER 5F EBU3.5 (CATHETERS) ×3 IMPLANT
CATH LAUNCHER 6FR EBU3.5 (CATHETERS) ×3 IMPLANT
CATH MICROCATH NAVVUS (MICROCATHETER) ×2 IMPLANT
DEVICE RAD COMP TR BAND LRG (VASCULAR PRODUCTS) ×3 IMPLANT
GLIDESHEATH SLEND SS 6F .021 (SHEATH) ×3 IMPLANT
GUIDEWIRE INQWIRE 1.5J.035X260 (WIRE) ×2 IMPLANT
INQWIRE 1.5J .035X260CM (WIRE) ×3
KIT ENCORE 26 ADVANTAGE (KITS) ×3 IMPLANT
KIT HEART LEFT (KITS) ×3 IMPLANT
MICROCATHETER NAVVUS (MICROCATHETER) ×3
PACK CARDIAC CATHETERIZATION (CUSTOM PROCEDURE TRAY) ×3 IMPLANT
STENT RESOLUTE ONYX 2.5X22 (Permanent Stent) ×3 IMPLANT
SYR MEDRAD MARK V 150ML (SYRINGE) ×3 IMPLANT
TRANSDUCER W/STOPCOCK (MISCELLANEOUS) ×3 IMPLANT
TUBING CIL FLEX 10 FLL-RA (TUBING) ×3 IMPLANT
WIRE COUGAR XT STRL 190CM (WIRE) ×3 IMPLANT

## 2017-04-08 NOTE — Consult Note (Signed)
Cardiology Consultation:   Patient ID: Emma Turner; [id number]; Feb 04, 1966   Admit date: 04/08/2017 Date of Consult: 04/08/2017  Primary Care Provider: Patient, No Pcp Per Primary Cardiologist: New   Patient Profile:   Emma Turner is a 51 y.o. female with a hx of sleep apnea, hypertension, depression and Bipolar 1 disorder who is being seen today for the evaluation of chest pain at the request of Dr. Eliseo Squires.  History of Present Illness:   Emma Turner developed chest pain yesterday morning around 10 AM while seated at home and eating ice. She had floaters in her chest that felt like an anxiety attack which she has had in the past many years ago. She had left upper chest pressure that radiated to the left neck and shoulder associated with nausea, shortness of breath, lightheadedness. This lasted for several hours. She reports having lower extremity edema for the last couple of days although she has none at this time. She has also had headaches over the last 4 days. Prior to this episode she had had no exertional chest discomfort or shortness of breath. Currently she has only a mild soreness in her upper chest.  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 She has been diagnosed with hypertension and was previously on medications however she said that they were not working and she stopped her medications about 2-1/2 years ago. Smokes cigarettes, was 2 packs per day and is down to half pack per day as she is trying to quit. She does not drink alcohol. She does not have diabetes.  The patient has no previous cardiac history. She was seen at [facility] in 08/2016 and ruled out for coronary event. She had a stress test showing normal perfusion and EF 59%. She was found to have severe anemia in the setting of fibroids/menorrhagia Her mother died at age 45 of kidney cancer. Her  father died at age 58 of an MI. Her brother died at age 58 of an MI that was likely associated with meth amphetamine and heroin use. Her sister died of a suicide.  Troponins have been negative X 2. SCr 0.88. K+ 3.4. Pt is anemic with Hgb 10.0, Hct 35.1.  BNP 31.4.  X-ray shows no active cardiopulmonary disease EKG shows normal sinus rhythm at 81 bpm and no ischemic changes  Past Medical History:  Diagnosis Date  . Bipolar 1 disorder ([place])   . Depression   . Hypertension   . Sleep apnea     Past Surgical History:  Procedure Laterality Date  . ECTOPIC PREGNANCY SURGERY    . FOOT SURGERY       Inpatient Medications: Scheduled Meds: . aspirin EC  81 mg Oral Daily  . ferrous sulfate  325 mg Oral QODAY  . hydrochlorothiazide  12.5 mg Oral Daily   Continuous Infusions:  PRN Meds: acetaminophen, gi cocktail, ondansetron (ZOFRAN) IV  Allergies:   No Known Allergies  Social History:   Social History   Social History  . Marital status: Divorced    Spouse name: N/A  . Number of children: N/A  . Years of education: N/A   Occupational History  . truck driver    Social History Main Topics  . Smoking status: Current Every Day Smoker    Packs/day: 2.00    Types: Cigarettes  . Smokeless tobacco: Never Used  . Alcohol  use No  . Drug use: Yes    Types: Marijuana     Comment: Consuming daily; has not used in 1 month   . Sexual activity: Yes    Birth control/ protection: None   Other Topics Concern  . Not on file   Social History Narrative  . No narrative on file    Family History:   The patient's family history includes Drug abuse in her brother; Heart attack in her brother; Kidney cancer in her mother; Stroke in her father.  ROS:  Please see the history of present illness.  All other ROS reviewed and negative.     Physical Exam/Data:   Vitals:   04/08/17 0445 04/08/17 0500 04/08/17 0543 04/08/17 0753  BP: 121/67 109/68 139/71 (!) 127/50  Pulse: 69 70 75 68    Resp: 18 17 16 18   Temp:   98 F (36.7 C) 97.5 F (36.4 C)  TempSrc:   Oral Oral  SpO2: 95% 90% 98%   Weight:   223 lb 1.6 oz (101.2 kg)   Height:   5\' 4"  (1.626 m)    No intake or output data in the 24 hours ending 04/08/17 0756 Filed Weights   04/08/17 0543  Weight: 223 lb 1.6 oz (101.2 kg)   Body mass index is 38.3 kg/m.  General:  Well nourished, well developed, in no acute distress HEENT: normal Lymph: no adenopathy Neck: no JVD Endocrine:  No thryomegaly Vascular: No carotid bruits; FA pulses 2+ bilaterally without bruits  Cardiac:  normal S1, S2; RRR; no murmur  Lungs:  clear to auscultation bilaterally, no wheezing, rhonchi or rales  Abd: soft, nontender, no hepatomegaly  Ext: no edema Musculoskeletal:  No deformities, BUE and BLE strength normal and equal Skin: warm and dry  Neuro:  CNs 2-12 intact, no focal abnormalities noted Psych:  Normal affect    EKG:  The EKG was personally reviewed and demonstrates normal sinus rhythm at 81 bpm and no ischemic changes   Relevant CV Studies: None available  Laboratory Data:  Chemistry  Recent Labs Lab 04/08/17 0159  NA 138  K 3.4*  CL 105  CO2 25  GLUCOSE 142*  BUN 12  CREATININE 0.88  CALCIUM 9.0  GFRNONAA >60  GFRAA >60  ANIONGAP 8    No results for input(s): PROT, ALBUMIN, AST, ALT, ALKPHOS, BILITOT in the last 168 hours. Hematology  Recent Labs Lab 04/08/17 0159  WBC 7.8  RBC 4.79  HGB 10.0*  HCT 35.1*  MCV 73.3*  MCH 20.9*  MCHC 28.5*  RDW 16.8*  PLT 302   Cardiac Enzymes  Recent Labs Lab 04/08/17 0459  TROPONINI <0.03     Recent Labs Lab 04/08/17 0231  TROPIPOC 0.00    BNP  Recent Labs Lab 04/08/17 0221  BNP 31.4    DDimer No results for input(s): DDIMER in the last 168 hours.  Radiology/Studies:  Dg Chest 2 View  Result Date: 04/08/2017 CLINICAL DATA:  Mid chest pain since yesterday. Left upper chest pain tonight. EXAM: CHEST  2 VIEW COMPARISON:  None. FINDINGS:  The heart size and mediastinal contours are within normal limits. Both lungs are clear. The visualized skeletal structures are unremarkable. IMPRESSION: No active cardiopulmonary disease. Electronically Signed   By: Lucienne Capers M.D.   On: 04/08/2017 02:39    Assessment and Plan:   Chest pain -Troponins have been negative X 2. SCr 0.88. K+ 3.4. Pt is anemic with Hgb 10.0, Hct 35.1.  BNP  31.4.  -Patient had ACS rule out in October/2017 at [facility] in the setting of severe anemia with normal stress test -X-ray shows no active cardiopulmonary disease -EKG shows normal sinus rhythm at 81 bpm and no ischemic changes -CVD risk factors include obesity, uncontrolled hypertension, smoking and family history -New onset of chest pain with associated shortness of breath, lightheadedness, heart pounding and nausea could be stress reaction in setting of work stress. She has a history of anxiety attacks many years ago. Her symptoms lasted for several hours and were worse with activity. Considering these sx along with risk factors, would pursue ischemic workup with stress test.  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 She is reluctant to pursue more intensive study as has been recommended as she does not feel that she could tolerate CPAP due to claustrophobia  Tobacco use -Strongly advise cessation. Patient has already reduced from 2 packs per day to half pack per day  Anemia -Hemoglobin 10.0, microcytic. No obvious bleeding sources. -Has had severe anemia in the past due to fibroids/menorrhagia requiring transfusion  Signed, Daune Perch, NP  04/08/2017 7:56 AM

## 2017-04-08 NOTE — Progress Notes (Signed)
Pt called @ 19:20 with c/o SOB, chest tightness & abdominal pain, & nausea. BP=189/96;HR=66; R=20 on RA O2Sat=100%. EKG done without changes noted.Placed on O2 @ 2L/min.  PA B. Strader & Dr Kenton Kingfisher informed.  Albuterol neb treatment, Xanax 0.5 mg po, GI cocktail, hydralazine 5 mg IV & Zofran 4 mg IV given as ordered. Rechecked pt after 15 mins & verbalized feeling better. Chest tightness, abdominal pain & nausea resolved.  BP=146/76 HR=64 R=17 with O2 Sats =100%. Will continue to watch pt.

## 2017-04-08 NOTE — ED Notes (Addendum)
Pt refused to allow blood pressure cup to take vitals. States that it is too tight. Pt refused nitro. States she believes she is stressed out and that her heart is fine. Pt refused tylenol, states that her stomach does not feel well. IV put in. Pt requested that IV be taken out because it hurt. Good blood flow observed.

## 2017-04-08 NOTE — Progress Notes (Signed)
Phone report received via [employer] copy [employer], reviewed VS, meds given, PMH, labs and general condition of patient, will assume care of patient upon arrival to 3W-15.

## 2017-04-08 NOTE — ED Provider Notes (Addendum)
TIME SEEN: 2:18 AM   04/08/2017  CHIEF COMPLAINT: Chest pain  HPI: This is a 51 year old female who does not take daily prescription medications presenting to the ED with chest pain. The patient states that 4 hours ago she developed L sided chest "pressure" that has been constant with lightheadedness, mild sweating, nausea, and dyspnea. Pain is non-radiating. No modifying factors. She has experienced similar symptoms in the past of milder severity; however she does not see a physician regularly and has never received evaluation. No prior heart catheterizations or stress tests. She denies any personal cardiac history. Brother died of MI at early-40's year of age. No vomiting. She does report bilateral leg swelling in the last 48 hours. She does not take daily prescription medications and has no prior diagnoses of HTN or diabetes. Is a daily smoker. She also reports feeling intermittent heart palpitations.   ROS: See HPI Constitutional: no fever  Eyes: no drainage  ENT: no runny nose   Cardiovascular:  + chest pain, sweating, peripheral edema Resp: + SOB  GI: no vomiting + nausea GU: no dysuria Integumentary: no rash  Allergy: no hives  Musculoskeletal: no leg swelling  Neurological: no slurred speech ROS otherwise negative  PAST MEDICAL HISTORY/PAST SURGICAL HISTORY:  Past Medical History:  Diagnosis Date  . Bipolar 1 disorder ([place])   . Depression   . Hypertension     MEDICATIONS:  Prior to Admission medications   Medication Sig Start Date End Date Taking? Authorizing Provider  HYDROcodone-acetaminophen (NORCO/VICODIN) 5-325 MG per tablet Take 1-2 tablets by mouth every 6 (six) hours as needed for pain. 06/09/13   Hyman Bible, PA-C  Ibuprofen-Diphenhydramine HCl (ADVIL PM) 200-25 MG CAPS Take 1 tablet by mouth at bedtime as needed (for sleep).    [provider]  methocarbamol (ROBAXIN) 500 MG tablet Take 1 tablet (500 mg total) by mouth 2 (two) times daily as needed for  muscle spasms. 12/13/14   Waynetta Pean, PA-C  naproxen (NAPROSYN) 500 MG tablet Take 1 tablet (500 mg total) by mouth 2 (two) times daily with a meal. 12/13/14   Waynetta Pean, PA-C  ondansetron (ZOFRAN ODT) 4 MG disintegrating tablet Take 1 tablet (4 mg total) by mouth every 8 (eight) hours as needed for nausea or vomiting. 12/13/14   Waynetta Pean, PA-C  triamterene-hydrochlorothiazide (DYAZIDE) 37.5-25 MG capsule Take 1 each (1 capsule total) by mouth daily. 04/03/17   Sherlene Shams, MD    ALLERGIES:  No Known Allergies  SOCIAL HISTORY:  Social History  Substance Use Topics  . Smoking status: Current Every Day Smoker    Packs/day: 2.00    Types: Cigarettes  . Smokeless tobacco: Never Used  . Alcohol use No    FAMILY HISTORY: History reviewed. No pertinent family history.  EXAM: BP (!) 159/80   Pulse 82   Temp 97.9 F (36.6 C) (Oral)   Resp 17   LMP 03/18/2017  CONSTITUTIONAL: Alert and oriented and responds appropriately to questions. Obese. HEAD: Normocephalic EYES: Conjunctivae clear, pupils appear equal, EOMI ENT: normal nose; moist mucous membranes NECK: Supple, no meningismus, no nuchal rigidity, no LAD  CARD: RRR; S1 and S2 appreciated; no murmurs, no clicks, no rubs, no gallops RESP: Normal chest excursion without splinting or tachypnea; breath sounds clear and equal bilaterally; no wheezes, no rhonchi, no rales, no hypoxia or respiratory distress, speaking full sentences ABD/GI: Normal bowel sounds; non-distended; soft, non-tender, no rebound, no guarding, no peritoneal signs, no hepatosplenomegaly BACK:  The  back appears normal and is non-tender to palpation, there is no CVA tenderness EXT: Normal ROM in all joints; non-tender to palpation; Mild nonpitting lower extremity edema noted to the feet; normal capillary refill; no cyanosis, no calf tenderness or swelling    SKIN: Normal color for age and race; warm; no rash NEURO: Moves all extremities equally,  normal speech, no facial droop PSYCH: The patient's mood and manner are appropriate. Grooming and personal hygiene are appropriate.  MEDICAL DECISION MAKING: Patient here with symptoms of chest pain with associated short of breath, diaphoresis, nausea and lightheadedness. She is a smoker and has a significant family history. Patient's heart score is a 4. EKG shows no ischemic abnormality. We'll give aspirin, nitroglycerin. Also complaining of diffuse headache that she has not taken any medications for. States normally ibuprofen helps with pain for her headache. Has had similar headaches before. Will give dose of Tylenol because we are giving aspirin here. We'll obtain cardiac labs. This time doubt dissection, PE. Given her heart score is a 4 and she has not had any provocative testing, I have recommended admission. Patient agrees with this plan.  ED PROGRESS: Patient's labs are unremarkable. Troponin negative. Chest x-ray clear.  BNP is normal. We'll discuss with medicine for admission. Patient does not have a primary care provider.    3:53 AM Discussed patient's case with hospitalist, Dr. Loleta Books.  I have recommended admission and patient (and family if present) agree with this plan. Admitting physician will place admission orders.   I reviewed all nursing notes, vitals, pertinent previous records, EKGs, lab and urine results, imaging (as available).  4:15 AM  Pt's chest pain is improving with nitroglycerin.   EKG Interpretation  Date/Time:  Thursday Apr 08 2017 01:57:50 EDT Ventricular Rate:  81 PR Interval:  114 QRS Duration: 90 QT Interval:  394 QTC Calculation: 457 R Axis:   40 Text Interpretation:  Normal sinus rhythm Normal ECG No significant change since last tracing Confirmed by Graeme Menees,  DO, Anaija Wissink [phone]) on 04/08/2017 2:15:07 AM         Cumi Sanagustin, Delice Bison, DO 04/08/17 [place], Delice Bison, DO 04/08/17 0569

## 2017-04-08 NOTE — H&P (Signed)
History and Physical  Patient Name: Emma Turner     ZOX:[id number]    DOB: [birth year]    DOA: 04/08/2017 PCP: Patient, No Pcp Per   Patient coming from: Home     Chief Complaint: Chest pain  HPI: Emma Turner is a 51 y.o. female with a past medical history significant for HTN, Bipolar, OSA, and microcytic anemia requiring transfusion who presents with chest pain.  The patient has been under stress from her employer recently because they put her out of work for not wearing her CPAP machine (she is claustrophobic).  Today, around 10A, she started having intermittent chest discomfort, pressure-like in character, moderate in intensity, initially brief and self-limited.  Then around 10PM the pain returned while she was ruminating about her employment situation, she had "heart pounding", nausea, diaphoresis, dizziness and so she came to the emergency room. He presented at rest, intensified with emotional stress, not exertion. She tried nothing to make it better. It was not associated with shortness of breath, unilateral leg swelling, recent surgery, hemoptysis.  ED course: -Afebrile, heart rate 82, respirations and pulse is normal, blood pressure 159/80 -Initial ECG showed normal sinus rhythm and troponin was negative. -Na 138, K 3.4, Cr 0.88, WBC 7.8, Hgb 10.0 and microcytic -BNP normal -Mag normal -Chest x-ray clear -TRH was asked to admit for observation, serial troponins and risk stratification.    Her lipids in Oct were normal except low HDL, HgbA1c last fall 5.2%.  She had a ACS rule out last Oct at [facility] in the setting of severe anemia from fibroids/menorrhagia requiring transfusion, and then stress test that was normal.  She has a family history of MI in her brother, although she notes this was in the contex of drug abuse.   The patient denies high blood pressure, but she has a chart diagnosis of a can carry everywhere, was prescribed HCTZ-triamterene as well as HCTZ lisinopril at  different times, which she does not take because she does not like the side effects. She does not take a baby aspirin. She does not take iron because it makes her constipated. As mentioned above, she had a sleep study mandated by [employer] that diagnosed OSA but she refuses to wear CPAP.  She has a chart history of Bipolar.      Review of Systems:  Review of Systems  Constitutional: Negative for chills, diaphoresis, fever, malaise/fatigue and weight loss.  Respiratory: Negative for cough, hemoptysis and shortness of breath.   Cardiovascular: Positive for chest pain and palpitations. Negative for orthopnea, claudication, leg swelling and PND.  Neurological: Positive for dizziness, tremors and headaches. Negative for weakness.  Psychiatric/Behavioral: The patient is nervous/anxious.   All other systems reviewed and are negative.    Past Medical History:  Diagnosis Date  . Bipolar 1 disorder ([place])   . Depression   . Hypertension   . Sleep apnea     Past Surgical History:  Procedure Laterality Date  . ECTOPIC PREGNANCY SURGERY    . FOOT SURGERY      Social History: Patient lives alone.  Patient walks unassisted.  She smkoes.  SHe is a Administrator, currently out of work.  Denies IVDU.  From [place].    No Known Allergies  Family history: family history includes Drug abuse in her brother; Heart attack in her brother; Kidney cancer in her mother.  Stroke in her father.  Prior to Admission medications   Medication Sig Start Date End Date Taking? Authorizing Provider  aspirin  EC 81 MG tablet Take 81 mg by mouth daily. 09/04/16 09/04/17  [provider]  ferrous sulfate 325 (65 FE) MG tablet Take 325 mg by mouth daily. 09/04/16 09/04/17  [provider]  hydrochlorothiazide (HYDRODIURIL) 12.5 MG tablet Take 12.5 mg by mouth daily. 09/04/16 09/04/17  [provider]  lisinopril (PRINIVIL,ZESTRIL) 5 MG tablet Take 5 mg by mouth daily. 09/04/16 09/04/17   [provider]       Physical Exam: BP 131/74   Pulse 75   Temp 97.9 F (36.6 C) (Oral)   Resp (!) 24   LMP 03/18/2017   SpO2 94%  General appearance: Well-developed, obese adult female, alert and in no acute distress.   Eyes: Anicteric, conjunctiva pink, lids and lashes normal.     ENT: No nasal deformity, discharge, or epistaxis.  OP moist without lesions.  Edentulous. Skin: Warm and dry.   Cardiac: RRR, nl S1-S2, no murmurs appreciated.  Capillary refill is brisk.  JVP not visible.  No LE edema.  Radial and DP pulses 2+ and symmetric.   Respiratory: Normal respiratory rate and rhythm.  CTAB without rales or wheezes. GI: Abdomen soft without rigidity.  No TTP. No ascites, distension.   MSK: No deformities or effusions.   Pain not reproduced with palpation of precordium.  No pain with arm movement. Neuro: Sensorium intact and responding to questions, attention normal.  Speech is fluent.  Moves all extremities equally and with normal coordination.    Psych: Behavior appropriate.  Affect stressed.  No evidence of aural or visual hallucinations or delusions.       Labs on Admission:  The metabolic panel shows mild hypokalemia, normal renal function. The complete blood count shows microcytic anemia, normal WBC, platelets. The initial troponin is negative.  Radiological Exams on Admission: Personally reviewed CXR shows no focal opacity, effusion, pneumothorax: Dg Chest 2 View  Result Date: 04/08/2017 CLINICAL DATA:  Mid chest pain since yesterday. Left upper chest pain tonight. EXAM: CHEST  2 VIEW COMPARISON:  None. FINDINGS: The heart size and mediastinal contours are within normal limits. Both lungs are clear. The visualized skeletal structures are unremarkable. IMPRESSION: No active cardiopulmonary disease. Electronically Signed   By: Lucienne Capers M.D.   On: 04/08/2017 02:39    EKG: Independently reviewed. Rate 81, QTc 457, normal sinus.  NM perfusion study and  echocardiogram Oct 2017 at [facility]: Normal EF No valvular disease Normal stress test        Assessment/Plan  1. Chest pain: This is new.  The patient has HEART score of 4. Angina is typical, but likely from anxiety surrounding her job.  Other potential causes of chest pain (PE, dissection, pancreatitis, pneumonia/effusion, pericarditis) are doubted.  -Serial troponins are ordered -Telemetry -Smoking cessation was recommended, specific modalities discussed, patient contemplative phase, nursing teaching ordered.   2. 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 Microcytic anemia:  From fibroids. -Restart iron  4. Stress:  Discussed stress reduction techniques and where to research them. -Recommended strongly she establish with a PCP  5. Hypokalemia:  Mild, mag normal.       DVT prophylaxis: Low risk Diet: Regular Code Status: FULL  Family Communication: None present  Disposition Plan: Anticipate overnight observation for arrhythmia on telemetry, serial troponins.  If testing negative, home after. Consults called: None Admission status: Telemetry   Medical decision making: Patient seen at 4:27 AM on 04/08/2017.  The  patient was discussed with Dr. Leonides Schanz. What exists of the patient's chart was reviewed in depth and outside records from [facility] were reviewed and summarized above.  Clinical condition: stable.      Edwin Dada [facility] Pager [phone]

## 2017-04-08 NOTE — Interval H&P Note (Signed)
Cath Lab Visit (complete for each Cath Lab visit)  Clinical Evaluation Leading to the Procedure:   ACS: Yes.    Non-ACS:    Anginal Classification: CCS IV  Anti-ischemic medical therapy: No Therapy  Non-Invasive Test Results: No non-invasive testing performed  Prior CABG: No previous CABG      History and Physical Interval Note:  04/08/2017 4:11 PM  Allean Montfort  has presented today for surgery, with the diagnosis of cp  The various methods of treatment have been discussed with the patient and family. After consideration of risks, benefits and other options for treatment, the patient has consented to  Procedure(s): Left Heart Cath and Coronary Angiography (N/A) as a surgical intervention .  The patient's history has been reviewed, patient examined, no change in status, stable for surgery.  I have reviewed the patient's chart and labs.  Questions were answered to the patient's satisfaction.     Sherren Mocha

## 2017-04-08 NOTE — Progress Notes (Signed)
1900 Patient called and reported feeling short of breath. Patient has expiratory wheezes, O2sats 100 % on room air. Given coke for possible side effect from [place]il[place] and requested respiratory neb treatment from on call provider. Report to Vita Erm RN

## 2017-04-08 NOTE — ED Triage Notes (Signed)
Pt here for chest pain onset today with assocaited sob, nausea, weakness, dizziness.

## 2017-04-08 NOTE — H&P (View-Only) (Signed)
Cardiology Consultation:   Patient ID: Emma Turner; [id number]; 12/06/1965   Admit date: 04/08/2017 Date of Consult: 04/08/2017  Primary Care Provider: Patient, No Pcp Per Primary Cardiologist: New   Patient Profile:   Emma Turner is a 51 y.o. female with a hx of sleep apnea, hypertension, depression and Bipolar 1 disorder who is being seen today for the evaluation of chest pain at the request of Dr. Eliseo Squires.  History of Present Illness:   Emma Turner developed chest pain yesterday morning around 10 AM while seated at home and eating ice. She had floaters in her chest that felt like an anxiety attack which she has had in the past many years ago. She had left upper chest pressure that radiated to the left neck and shoulder associated with nausea, shortness of breath, lightheadedness. This lasted for several hours. She reports having lower extremity edema for the last couple of days although she has none at this time. She has also had headaches over the last 4 days. Prior to this episode she had had no exertional chest discomfort or shortness of breath. Currently she has only a mild soreness in her upper chest.  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 She has been diagnosed with hypertension and was previously on medications however she said that they were not working and she stopped her medications about 2-1/2 years ago. Smokes cigarettes, was 2 packs per day and is down to half pack per day as she is trying to quit. She does not drink alcohol. She does not have diabetes.  The patient has no previous cardiac history. She was seen at [facility] in 08/2016 and ruled out for coronary event. She had a stress test showing normal perfusion and EF 59%. She was found to have severe anemia in the setting of fibroids/menorrhagia Her mother died at age 67 of kidney cancer. Her  father died at age 72 of an MI. Her brother died at age 51 of an MI that was likely associated with meth amphetamine and heroin use. Her sister died of a suicide.  Troponins have been negative X 2. SCr 0.88. K+ 3.4. Pt is anemic with Hgb 10.0, Hct 35.1.  BNP 31.4.  X-ray shows no active cardiopulmonary disease EKG shows normal sinus rhythm at 81 bpm and no ischemic changes  Past Medical History:  Diagnosis Date  . Bipolar 1 disorder ([place])   . Depression   . Hypertension   . Sleep apnea     Past Surgical History:  Procedure Laterality Date  . ECTOPIC PREGNANCY SURGERY    . FOOT SURGERY       Inpatient Medications: Scheduled Meds: . aspirin EC  81 mg Oral Daily  . ferrous sulfate  325 mg Oral QODAY  . hydrochlorothiazide  12.5 mg Oral Daily   Continuous Infusions:  PRN Meds: acetaminophen, gi cocktail, ondansetron (ZOFRAN) IV  Allergies:   No Known Allergies  Social History:   Social History   Social History  . Marital status: Divorced    Spouse name: N/A  . Number of children: N/A  . Years of education: N/A   Occupational History  . truck driver    Social History Main Topics  . Smoking status: Current Every Day Smoker    Packs/day: 2.00    Types: Cigarettes  . Smokeless tobacco: Never Used  . Alcohol  use No  . Drug use: Yes    Types: Marijuana     Comment: Consuming daily; has not used in 1 month   . Sexual activity: Yes    Birth control/ protection: None   Other Topics Concern  . Not on file   Social History Narrative  . No narrative on file    Family History:   The patient's family history includes Drug abuse in her brother; Heart attack in her brother; Kidney cancer in her mother; Stroke in her father.  ROS:  Please see the history of present illness.  All other ROS reviewed and negative.     Physical Exam/Data:   Vitals:   04/08/17 0445 04/08/17 0500 04/08/17 0543 04/08/17 0753  BP: 121/67 109/68 139/71 (!) 127/50  Pulse: 69 70 75 68    Resp: 18 17 16 18   Temp:   98 F (36.7 C) 97.5 F (36.4 C)  TempSrc:   Oral Oral  SpO2: 95% 90% 98%   Weight:   223 lb 1.6 oz (101.2 kg)   Height:   5\' 4"  (1.626 m)    No intake or output data in the 24 hours ending 04/08/17 0756 Filed Weights   04/08/17 0543  Weight: 223 lb 1.6 oz (101.2 kg)   Body mass index is 38.3 kg/m.  General:  Well nourished, well developed, in no acute distress HEENT: normal Lymph: no adenopathy Neck: no JVD Endocrine:  No thryomegaly Vascular: No carotid bruits; FA pulses 2+ bilaterally without bruits  Cardiac:  normal S1, S2; RRR; no murmur  Lungs:  clear to auscultation bilaterally, no wheezing, rhonchi or rales  Abd: soft, nontender, no hepatomegaly  Ext: no edema Musculoskeletal:  No deformities, BUE and BLE strength normal and equal Skin: warm and dry  Neuro:  CNs 2-12 intact, no focal abnormalities noted Psych:  Normal affect    EKG:  The EKG was personally reviewed and demonstrates normal sinus rhythm at 81 bpm and no ischemic changes   Relevant CV Studies: None available  Laboratory Data:  Chemistry  Recent Labs Lab 04/08/17 0159  NA 138  K 3.4*  CL 105  CO2 25  GLUCOSE 142*  BUN 12  CREATININE 0.88  CALCIUM 9.0  GFRNONAA >60  GFRAA >60  ANIONGAP 8    No results for input(s): PROT, ALBUMIN, AST, ALT, ALKPHOS, BILITOT in the last 168 hours. Hematology  Recent Labs Lab 04/08/17 0159  WBC 7.8  RBC 4.79  HGB 10.0*  HCT 35.1*  MCV 73.3*  MCH 20.9*  MCHC 28.5*  RDW 16.8*  PLT 302   Cardiac Enzymes  Recent Labs Lab 04/08/17 0459  TROPONINI <0.03     Recent Labs Lab 04/08/17 0231  TROPIPOC 0.00    BNP  Recent Labs Lab 04/08/17 0221  BNP 31.4    DDimer No results for input(s): DDIMER in the last 168 hours.  Radiology/Studies:  Dg Chest 2 View  Result Date: 04/08/2017 CLINICAL DATA:  Mid chest pain since yesterday. Left upper chest pain tonight. EXAM: CHEST  2 VIEW COMPARISON:  None. FINDINGS:  The heart size and mediastinal contours are within normal limits. Both lungs are clear. The visualized skeletal structures are unremarkable. IMPRESSION: No active cardiopulmonary disease. Electronically Signed   By: Lucienne Capers M.D.   On: 04/08/2017 02:39    Assessment and Plan:   Chest pain -Troponins have been negative X 2. SCr 0.88. K+ 3.4. Pt is anemic with Hgb 10.0, Hct 35.1.  BNP  31.4.  -Patient had ACS rule out in October/2017 at [facility] in the setting of severe anemia with normal stress test -X-ray shows no active cardiopulmonary disease -EKG shows normal sinus rhythm at 81 bpm and no ischemic changes -CVD risk factors include obesity, uncontrolled hypertension, smoking and family history -New onset of chest pain with associated shortness of breath, lightheadedness, heart pounding and nausea could be stress reaction in setting of work stress. She has a history of anxiety attacks many years ago. Her symptoms lasted for several hours and were worse with activity. Considering these sx along with risk factors, would pursue ischemic workup with stress test.  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 She is reluctant to pursue more intensive study as has been recommended as she does not feel that she could tolerate CPAP due to claustrophobia  Tobacco use -Strongly advise cessation. Patient has already reduced from 2 packs per day to half pack per day  Anemia -Hemoglobin 10.0, microcytic. No obvious bleeding sources. -Has had severe anemia in the past due to fibroids/menorrhagia requiring transfusion  Signed, Daune Perch, NP  04/08/2017 7:56 AM

## 2017-04-08 NOTE — Progress Notes (Signed)
Patient admitted after midnight, please see H&P.  For cath today.  Patient needs to establish with PCP.  Eulogio Bear DO

## 2017-04-09 ENCOUNTER — Encounter (HOSPITAL_COMMUNITY): Payer: Self-pay | Admitting: Cardiovascular Disease

## 2017-04-09 DIAGNOSIS — I2510 Atherosclerotic heart disease of native coronary artery without angina pectoris: Secondary | ICD-10-CM

## 2017-04-09 DIAGNOSIS — I200 Unstable angina: Secondary | ICD-10-CM

## 2017-04-09 DIAGNOSIS — I2583 Coronary atherosclerosis due to lipid rich plaque: Secondary | ICD-10-CM

## 2017-04-09 DIAGNOSIS — E876 Hypokalemia: Secondary | ICD-10-CM | POA: Diagnosis not present

## 2017-04-09 DIAGNOSIS — I10 Essential (primary) hypertension: Secondary | ICD-10-CM | POA: Diagnosis not present

## 2017-04-09 LAB — TROPONIN I
TROPONIN I: 0.08 ng/mL — AB (ref <0.03)
TROPONIN I: 0.07 ng/mL — AB (ref <0.03)

## 2017-04-09 LAB — CBC
HEMATOCRIT: 33.3 % — AB (ref 36.0–46.0)
Hemoglobin: 9.3 g/dL — ABNORMAL LOW (ref 12.0–15.0)
MCH: 20.5 pg — AB (ref 26.0–34.0)
MCHC: 27.9 g/dL — AB (ref 30.0–36.0)
MCV: 73.5 fL — ABNORMAL LOW (ref 78.0–100.0)
Platelets: 244 10*3/uL (ref 150–400)
RBC: 4.53 MIL/uL (ref 3.87–5.11)
RDW: 16.8 % — AB (ref 11.5–15.5)
WBC: 7.8 10*3/uL (ref 4.0–10.5)

## 2017-04-09 LAB — BASIC METABOLIC PANEL
Anion gap: 5 (ref 5–15)
BUN: 8 mg/dL (ref 6–20)
CALCIUM: 8.7 mg/dL — AB (ref 8.9–10.3)
CO2: 26 mmol/L (ref 22–32)
Chloride: 109 mmol/L (ref 101–111)
Creatinine, Ser: 0.75 mg/dL (ref 0.44–1.00)
GFR calc Af Amer: >60 mL/min (ref >60)
GFR calc non Af Amer: >60 mL/min (ref >60)
GLUCOSE: 131 mg/dL — AB (ref 65–99)
Potassium: 3.7 mmol/L (ref 3.5–5.1)
Sodium: 140 mmol/L (ref 135–145)

## 2017-04-09 MED ORDER — HYDROCHLOROTHIAZIDE 12.5 MG PO TABS: 12.5000 mg | ORAL_TABLET | Freq: Every day | ORAL | 0 refills | Status: DC

## 2017-04-09 MED ORDER — TICAGRELOR 90 MG PO TABS: 90.0000 mg | ORAL_TABLET | Freq: Two times a day (BID) | ORAL | 0 refills | Status: DC

## 2017-04-09 MED ORDER — LISINOPRIL 5 MG PO TABS: 5.0000 mg | ORAL_TABLET | Freq: Every day | ORAL | 0 refills | Status: DC

## 2017-04-09 MED ORDER — LORAZEPAM 2 MG/ML IJ SOLN
0.5000 mg | Freq: Once | INTRAMUSCULAR | Status: AC
  Administered 2017-04-09: 0.5 mg via INTRAVENOUS
  Filled 2017-04-09: qty 1

## 2017-04-09 MED ORDER — ASPIRIN EC 81 MG PO TBEC: 81.0000 mg | DELAYED_RELEASE_TABLET | Freq: Every day | ORAL | 0 refills | Status: DC

## 2017-04-09 NOTE — Progress Notes (Signed)
Patient seen by social work and given housing options; has not decided at this time where she will go once discharged.  Printed prescriptions are on the chart, but seeking clarification on where patient can obtain meds as [facility] appears to be closed unti Tuesday.  Awaiting call from on call Care Manager regarding medications.  Dr. Marinus Maw informed.

## 2017-04-09 NOTE — Progress Notes (Signed)
RN called in the patient's room at approximately 1:00am & accused by the patient of  taking the protective screen of her phone. As per NT, patient says " She probably wants to see who called me".  Patient  says "Hospital owes me $23.00 for my screen protector, I had it for 1 1/2 years" Supervisor notified & spoke to the patient.  2 Security officers came & spoke with pt. Report will be written as to what transpired  as per security. Patient reminded of hospital's valuable policy & advised to give her cellphone & other  valuable belongings to security for safe & secure keeping. Pt refused. We will now be continuing pt care with 2 staff every time we go to the patient's room.

## 2017-04-09 NOTE — Progress Notes (Signed)
TR BAND REMOVAL  LOCATION:    right radial  DEFLATED PER PROTOCOL:    Yes.    TIME BAND OFF / DRESSING APPLIED:    23:00   SITE UPON ARRIVAL:    Level 0  SITE AFTER BAND REMOVAL:    Level 0  CIRCULATION SENSATION AND MOVEMENT:    Within Normal Limits   Yes.    COMMENTS:   Post TR band instructions given. Pt tolerated well. 

## 2017-04-09 NOTE — Progress Notes (Signed)
Patient reported severe pain to right forearm at the area of radial site and proximal to that.  Assessment negative.  Radial pulse strong.  Cardiology and internal medicine both made aware.

## 2017-04-09 NOTE — Progress Notes (Signed)
CARDIAC REHAB PHASE I   Pt in bed, c/o R arm pain, chest pain, shortness of breath and anxiety. Pt declines ambulation. Completed PCI/stent education.  Reviewed risk factors, tobacco cessation (gave pt fake cigarette), PCI book, anti-platelet therapy, stent card, activity restrictions, ntg, exercise, heart healthy diet, and phase 2 cardiac rehab. Pt verbalized understanding. Pt agrees to phase 2 cardiac rehab referral, will send to [facility]. 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 She states she is currently unable to work until she is evaluated for sleep apnea, which she denies she has. Pt states she has no PCP as she hs "never been sick." When discussing medications, pt states she "will not be able to afford anything." Pt to see case manager prior to discharge. Offered at end of education to walk, pt again declines. Pt in bed, call bell within reach.   [id number] Lenna Sciara, RN, BSN 04/09/2017 8:48 AM

## 2017-04-09 NOTE — Care Management Note (Addendum)
Case Management Note  Patient Details  Name: Emma Turner MRN: [id number] Date of Birth: 26-Mar-1966  Subjective/Objective:   S/p stent intervention, will be on brilinta, NCM awaiting benefit check.  Patient states she is out of work on disability and will not be able to afford medications at all.  NCM gave patient the patient ast application for brilinta to complete and take to apt [facility].   PCP listed -none,  Will see if can get follow up apt at [facility] and [facility]- patient will need to call on 5/29 to make a follow up apt, this information was given to patient with [facility] clinic brochure.               Action/Plan: NCM will follow for dc needs.   Expected Discharge Date:                  Expected Discharge Plan:  Home/Self Care  In-House Referral:     Discharge planning Services  CM Consult  Post Acute Care Choice:    Choice offered to:     DME Arranged:    DME Agency:     HH Arranged:    HH Agency:     Status of Service:  In process, will continue to follow  If discussed at Long Length of Stay Meetings, dates discussed:    Additional Comments:  Zenon Mayo, RN 04/09/2017, 9:16 AM

## 2017-04-09 NOTE — Clinical Social Work Note (Signed)
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 Needs help to find a place to go. Planned discharge tomorrow.    CSW talked with patient at the bedside regarding her situation. 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 Per patient she gave her home up about 3 years ago once she started driving the truck. She has not worked since 5/9 due to illness and her last check was $165. Options discussed regarding staying with family, etc.   Patient reported that she uses her aunt's address as her mailing address but cannot stay with her as she is ornery and things have to be her way or "the highway". Patient stayed briefly with her ex-husband but does not want to stay there to recuperate because he drinks, uses drugs and his home is not clean. Patient has 3 children and reported that her son is in the [place], one daughter is married and lives in [place] and her other daughter lives with her boyfriend. Patient reported that her children know that she is in the hospital, but did not appear amenable to contacting her daughters for assistance and this was discussed. Ms. Fenter did state that she may stay in her car and this was discussed, along with where she would eat. CSW talked with patient about shelter resources as she wants to be in [place] and provided her with a Production manager, information on the [employer] [facility]) and food/pantry brochure. Patient expressed appreciation to CSW for information provided.  CSW signing off, however please reconsult if any other SW needs arise prior to discharge.  Jere Vanburen Givens, MSW, LCSW Licensed Clinical Social Worker [place] [phone]

## 2017-04-09 NOTE — Progress Notes (Signed)
Progress Note  Patient Name: Emma Turner Date of Encounter: 04/09/2017  Primary Cardiologist: Dr Radford Pax  Patient Profile     51 y.o. female w/ hx sleep apnea, hypertension, depression and Bipolar 1 disorder was admitted 05/24 w/ CP>>cath w/ DES LAD, single vessel dz.   Subjective   C/o chest tightness ever since the procedure, +SOB, chest wall tenderness, pain RLE, pain RUE, lightheaded when she gets up.  Inpatient Medications    Scheduled Meds: . aspirin EC  81 mg Oral Daily  . ferrous sulfate  325 mg Oral QODAY  . hydrochlorothiazide  12.5 mg Oral Daily  . nicotine  7 mg Transdermal Daily  . sodium chloride flush  3 mL Intravenous Q12H  . ticagrelor  90 mg Oral BID   Continuous Infusions: . sodium chloride     PRN Meds: sodium chloride, acetaminophen, albuterol, ALPRAZolam, gi cocktail, ondansetron (ZOFRAN) IV, sodium chloride flush   Vital Signs    Vitals:   04/08/17 2000 04/08/17 2016 04/09/17 0150 04/09/17 0700  BP: (!) 175/81 (!) 146/76 (!) 170/86 134/78  Pulse: 69 64 62 66  Resp: 16 17 14 17   Temp:   97.7 F (36.5 C) 97.7 F (36.5 C)  TempSrc:   Oral Oral  SpO2: 100% 100% 97% 96%  Weight:   238 lb 12.1 oz (108.3 kg)   Height:        Intake/Output Summary (Last 24 hours) at 04/09/17 0916 Last data filed at 04/09/17 0700  Gross per 24 hour  Intake          1241.31 ml  Output             3450 ml  Net         -2208.69 ml   Filed Weights   04/08/17 0543 04/09/17 0150  Weight: 223 lb 1.6 oz (101.2 kg) 238 lb 12.1 oz (108.3 kg)    Telemetry    SR, Sinus brady, HR high 40s at times, not sustained. Dropped beats last pm after cath, not seen after midnight, no pauses > 3 sec - Personally Reviewed  ECG    05/25, SR w/ minor T wave changes in lead III only, not significant - Personally Reviewed  Physical Exam   General: Well developed, well nourished, female appearing in no acute distress. Head: Normocephalic, atraumatic.  Neck: Supple without  bruits, JVD not elevated. Lungs:  Resp regular and unlabored, CTA. Chest wall tender to palpation. Heart: RRR, S1, S2, no S3, S4, or murmur; no rub. Abdomen: Soft, non-tender, non-distended with normoactive bowel sounds. No hepatomegaly. No rebound/guarding. No obvious abdominal masses. Extremities: No clubbing, cyanosis, no edema. Distal pedal pulses are 2+ bilaterally. R radial cath site without ecchymosis or hematoma but very tender to palpation, no edema, sensation and movement intact. RLE tender to palpation, no edema, pulses and movement intact. Neuro: Alert and oriented X 3. Moves all extremities spontaneously. Psych: Anxious affect.  Labs    Hematology  Recent Labs Lab 04/08/17 0159 04/09/17 0441  WBC 7.8 7.8  RBC 4.79 4.53  HGB 10.0* 9.3*  HCT 35.1* 33.3*  MCV 73.3* 73.5*  MCH 20.9* 20.5*  MCHC 28.5* 27.9*  RDW 16.8* 16.8*  PLT 302 244    Chemistry  Recent Labs Lab 04/08/17 0159 04/09/17 0441  NA 138 140  K 3.4* 3.7  CL 105 109  CO2 25 26  GLUCOSE 142* 131*  BUN 12 8  CREATININE 0.88 0.75  CALCIUM 9.0 8.7*  GFRNONAA >60 >60  GFRAA >60 >60  ANIONGAP 8 5     Cardiac Enzymes  Recent Labs Lab 04/08/17 0459 04/08/17 0722  TROPONINI <0.03 <0.03     Recent Labs Lab 04/08/17 0231  TROPIPOC 0.00     BNP  Recent Labs Lab 04/08/17 0221  BNP 31.4      Radiology    Dg Chest 2 View  Result Date: 04/08/2017 CLINICAL DATA:  Mid chest pain since yesterday. Left upper chest pain tonight. EXAM: CHEST  2 VIEW COMPARISON:  None. FINDINGS: The heart size and mediastinal contours are within normal limits. Both lungs are clear. The visualized skeletal structures are unremarkable. IMPRESSION: No active cardiopulmonary disease. Electronically Signed   By: Lucienne Capers M.D.   On: 04/08/2017 02:39     Cardiac Studies   CATH: 04/08/2017 Conclusion  1. Severe single-vessel coronary artery disease involving the mid LAD 80%, treated successfully with a  single drug-eluting stent after performing pressure wire analysis 2. Moderate mid RCA stenosis 3. Widely patent left main and left circumflex 4. Normal left ventricular function with LVEF 65% Recommendations: Dual antiplatelet therapy with aspirin and brilinta for 1 year. Should be okay for hospital discharge tomorrow morning if no complications arise.   Post-Intervention Diagram         Patient Profile     51 y.o. female w/ hx  sleep apnea, hypertension, depression and Bipolar 1 disorder was admitted 05/24 w/ CP>>cath w/ DES LAD, single vessel dz.   Assessment & Plan    Principal Problem:   Unstable angina pectoris ([place]) - s/p DES LAD, single vessel disease. - pt very concerned about her stent and her CP, will ck troponin - if troponin elevated (likely), ok for d/c today.   Pain - areas of pain have no known injury.  - cath site may be sore at first, should improve rapidly. Ok to take Tylenol - no cause for CP on CXR - no injury or problem w/ RLE on exam  SOB - lungs clear bilaterally, CXR was ok - encourage pt to ambulate, that may help  Active Problems:   Essential hypertension - BP was up overnight, better now - rx was restarted    Iron deficiency anemia due to chronic blood loss - does not tolerate higher doses of iron - f/u with PCP, OB/GYN for management    Hypokalemia - supplemented and improved    Tobacco abuse - nicotine patch in place  Plan: ambulate and d/c later if troponin negative and pt does well.  Jonetta Speak , PA-C 9:16 AM 04/09/2017 Pager: [phone]

## 2017-04-09 NOTE — Care Management (Signed)
ED CM received call from [facility] on [facility] concerning where patient will pick up medications. ED CM explained to him that patient has health insurance coverage, so she does not meet any criteria for medication assistance. Patient was provided a Brilinta 30 day free card by Daytime CM on unit.  Patient is for discharge. No further CM needs identified.

## 2017-04-09 NOTE — Discharge Summary (Addendum)
Physician Discharge Summary  Hattye Siegfried HGD:[id number] DOB: [birth year] DOA: 04/08/2017  PCP: Patient, No Pcp Per  Admit date: 04/08/2017 Discharge date: 04/09/2017  Admitted From:home Disposition:home  Recommendations for Outpatient Follow-up:  1. Follow up with PCP in 1-2 weeks 2. Please obtain BMP/CBC in one week 3. Follow-up with her cardiologist as scheduled.  Home Health:no Equipment/Devices:no Discharge Condition:stable CODE STATUS:full code Diet recommendation:heart healthy  Brief/Interim Summary: 51 year old female with history of hypertension, bipolar, tobacco use, microcytic anemia presented with chest pain. In the ER troponin negative with no significant change in EKG. Patient was seen and evaluated by cardiologist and underwent cardiac cath. Patient has very strong family history of coronary artery disease and significant tobacco use history. The cardiac cath showed 80% mid LAD status post successful drug-eluting stent with residual moderate mid RCA stenosis of 50%. EF of 65%. Patient was started on aspirin and Brilinta. Patient was complaining of mild chest discomfort today morning. Troponin was repeated which was 0.08 likely in the setting of cardiac procedure done yesterday. Reevaluated by Dr. Radford Pax from cardiology. The repeat troponin trending down to 0.07. Patient might have some anxiety. Evaluated by case manager for the medication including aspirin and Brilinta. She has free card for a month supply of Brilinta and then she will follow-up at the cardiology office. At that time to cardiology may decide to change to Plavix. She needs to be on dual antiplatelet agent for at least a year. Continued home medication for hypertension management. Recommended to continue home medicine and follow-up with her PCP. Hypokalemia resolved.  I discussed with Dr. Radford Pax from cardiologist regarding discharge plan and after the second set of troponin of 0.07. She recommended to discharge  patient with current regimen and follow-up with cardiology outpatient.  Addendum: patient was re-examined at bedside around 4:10 PM. She has no chest pain or SOB. RUE radial pulse is palpable, warm hand and able to move with no difficulties. Explained the discharge medication. Doesn't want to take antihypertensive medications. Education provided to the patient.SW consulted because patient wants some social support. Discussed with pt's RN.  Discharge Diagnoses:  Principal Problem:   Unstable angina pectoris (HCC) Active Problems:   Essential hypertension   Iron deficiency anemia due to chronic blood loss   Hypokalemia   Tobacco abuse    Discharge Instructions  Discharge Instructions    Amb Referral to Cardiac Rehabilitation    Complete by:  As directed    Diagnosis:  Coronary Stents   Call MD for:  difficulty breathing, headache or visual disturbances    Complete by:  As directed    Call MD for:  extreme fatigue    Complete by:  As directed    Call MD for:  hives    Complete by:  As directed    Call MD for:  persistant dizziness or light-headedness    Complete by:  As directed    Call MD for:  persistant nausea and vomiting    Complete by:  As directed    Call MD for:  severe uncontrolled pain    Complete by:  As directed    Call MD for:  temperature >100.4    Complete by:  As directed    Diet - low sodium heart healthy    Complete by:  As directed    Discharge instructions    Complete by:  As directed    Please follow up with your cardiologist and PCP.   Increase activity slowly    Complete  by:  As directed      Allergies as of 04/09/2017   No Known Allergies     Medication List    TAKE these medications   aspirin EC 81 MG tablet Take 1 tablet (81 mg total) by mouth daily.   ferrous sulfate 325 (65 FE) MG tablet Take 325 mg by mouth daily.   hydrochlorothiazide 12.5 MG tablet Commonly known as:  HYDRODIURIL Take 1 tablet (12.5 mg total) by mouth daily.    lisinopril 5 MG tablet Commonly known as:  PRINIVIL,ZESTRIL Take 1 tablet (5 mg total) by mouth daily.   ticagrelor 90 MG Tabs tablet Commonly known as:  BRILINTA Take 1 tablet (90 mg total) by mouth 2 (two) times daily.      Follow-up [facility] Follow up.   Why:  call on 5/29 to schedule a hospital follow up apt. Contact information: [place] [address] [phone]       Lyda Jester M, PA-C Follow up on 04/23/2017.   Specialties:  Cardiology, Radiology Why:  Please arrive at 9:15 am for a 9:30 am appt.  Contact information: [place] [address] [phone]          No Known Allergies  Consultations: Cardiology  Procedures/Studies: Cardiac cath  Subjective: Seen and examined at bedside. Reported mild chest discomfort but denied shortness of breath, nausea or vomiting. Education provided regarding aspirin and antiplatelet agents. Verbalized understanding. Denied nausea vomiting and dizziness.  Discharge Exam: Vitals:   04/09/17 0700 04/09/17 1233  BP: 134/78 134/77  Pulse: 66 72  Resp: 17 16  Temp: 97.7 F (36.5 C) 97.7 F (36.5 C)   Vitals:   04/08/17 2016 04/09/17 0150 04/09/17 0700 04/09/17 1233  BP: (!) 146/76 (!) 170/86 134/78 134/77  Pulse: 64 62 66 72  Resp: 17 14 17 16   Temp:  97.7 F (36.5 C) 97.7 F (36.5 C) 97.7 F (36.5 C)  TempSrc:  Oral Oral Oral  SpO2: 100% 97% 96% 97%  Weight:  108.3 kg (238 lb 12.1 oz)    Height:        General: Pt is alert, awake, not in acute distress Cardiovascular: RRR, S1/S2 +, no rubs, no gallops Respiratory: CTA bilaterally, no wheezing, no rhonchi Abdominal: Soft, NT, ND, bowel sounds + Extremities: no edema, no cyanosis, distal pulses in upper extremities palpable. Cath site has dressing applied with no sign of bleeding.    The results of significant diagnostics from this  hospitalization (including imaging, microbiology, ancillary and laboratory) are listed below for reference.     Microbiology: No results found for this or any previous visit (from the past 240 hour(s)).   Labs: BNP (last 3 results)  Recent Labs  04/08/17 0221  BNP 80.0   Basic Metabolic Panel:  Recent Labs Lab 04/08/17 0159 04/08/17 0223 04/09/17 0441  NA 138  --  140  K 3.4*  --  3.7  CL 105  --  109  CO2 25  --  26  GLUCOSE 142*  --  131*  BUN 12  --  8  CREATININE 0.88  --  0.75  CALCIUM 9.0  --  8.7*  MG  --  2.0  --    Liver Function Tests: No results for input(s): AST, ALT, ALKPHOS, BILITOT, PROT, ALBUMIN in the last 168 hours. No results for input(s): LIPASE, AMYLASE in the last 168 hours. No  results for input(s): AMMONIA in the last 168 hours. CBC:  Recent Labs Lab 04/08/17 0159 04/09/17 0441  WBC 7.8 7.8  HGB 10.0* 9.3*  HCT 35.1* 33.3*  MCV 73.3* 73.5*  PLT 302 244   Cardiac Enzymes:  Recent Labs Lab 04/08/17 0459 04/08/17 0722 04/09/17 1012 04/09/17 1439  TROPONINI <0.03 <0.03 0.08* 0.07*   BNP: Invalid input(s): POCBNP CBG: No results for input(s): GLUCAP in the last 168 hours. D-Dimer No results for input(s): DDIMER in the last 72 hours. Hgb A1c No results for input(s): HGBA1C in the last 72 hours. Lipid Profile No results for input(s): CHOL, HDL, LDLCALC, TRIG, CHOLHDL, LDLDIRECT in the last 72 hours. Thyroid function studies No results for input(s): TSH, T4TOTAL, T3FREE, THYROIDAB in the last 72 hours.  Invalid input(s): FREET3 Anemia work up No results for input(s): VITAMINB12, FOLATE, FERRITIN, TIBC, IRON, RETICCTPCT in the last 72 hours. Urinalysis    Component Value Date/Time   LABSPEC 1.020 07/03/2012 1143   PHURINE 5.5 07/03/2012 1143   GLUCOSEU NEGATIVE 07/03/2012 1143   HGBUR LARGE (A) 07/03/2012 1143   BILIRUBINUR NEGATIVE 07/03/2012 1143   KETONESUR NEGATIVE 07/03/2012 1143   PROTEINUR 30 (A) 07/03/2012 1143    UROBILINOGEN 0.2 07/03/2012 1143   NITRITE NEGATIVE 07/03/2012 1143   LEUKOCYTESUR NEGATIVE 07/03/2012 1143   Sepsis Labs Invalid input(s): PROCALCITONIN,  WBC,  LACTICIDVEN Microbiology No results found for this or any previous visit (from the past 240 hour(s)).   Time coordinating discharge: 32 minutes  SIGNED:   Rosita Fire, MD  [facility] 04/09/2017, 4:19 PM  If 7PM-7AM, please contact night-coverage [email or web address] Password [email or web address]

## 2017-04-09 NOTE — Progress Notes (Addendum)
Brilinta 90mg  BID is covered but copay is $363.19 due to patient not meeting deductible at this time. There is a quantity limit of 2 per day. No prior Josem Kaufmann is needed. There is an alternative called Clopidogrel which does not need prior auth, no quantity limit, and total cost at [facility] (which the patient uses) is $9.97.   Patient states Plavix will be better for her to afford, she is out on disability and will not be able to afford brilinta.

## 2017-04-10 NOTE — Progress Notes (Signed)
Patient was seen at bedside. Discussed with the patient and her nurse. Medications and discharge management was already done. Patient has no new symptoms and understands the follow-up plan. She has medications and understand to follow up with PCP and cardiologist. Patient will be discharged today in a stable condition. The discharge orders done yesterday.

## 2017-04-10 NOTE — Progress Notes (Addendum)
Spoke with jim RN on [place], reiterated what was previously stated in CM notes in reguard to medication assistance. No other CM assistance available. Plan is for patient to DC today- order written yesterday. CSW available if transportation needs arise ([phone]).

## 2017-04-12 ENCOUNTER — Encounter (HOSPITAL_COMMUNITY): Payer: Self-pay | Admitting: Emergency Medicine

## 2017-04-12 ENCOUNTER — Observation Stay (HOSPITAL_COMMUNITY)
Admission: EM | Admit: 2017-04-12 | Discharge: 2017-04-13 | Discharge status: Against Medical Advice | Payer: 59 | Attending: Family Medicine | Admitting: Family Medicine

## 2017-04-12 ENCOUNTER — Emergency Department (HOSPITAL_COMMUNITY): Payer: 59

## 2017-04-12 DIAGNOSIS — I252 Old myocardial infarction: Secondary | ICD-10-CM | POA: Insufficient documentation

## 2017-04-12 DIAGNOSIS — F17210 Nicotine dependence, cigarettes, uncomplicated: Secondary | ICD-10-CM | POA: Insufficient documentation

## 2017-04-12 DIAGNOSIS — Z720 Tobacco use: Secondary | ICD-10-CM | POA: Diagnosis not present

## 2017-04-12 DIAGNOSIS — R079 Chest pain, unspecified: Secondary | ICD-10-CM | POA: Diagnosis not present

## 2017-04-12 DIAGNOSIS — G4733 Obstructive sleep apnea (adult) (pediatric): Secondary | ICD-10-CM | POA: Diagnosis not present

## 2017-04-12 DIAGNOSIS — N920 Excessive and frequent menstruation with regular cycle: Secondary | ICD-10-CM | POA: Insufficient documentation

## 2017-04-12 DIAGNOSIS — D508 Other iron deficiency anemias: Secondary | ICD-10-CM

## 2017-04-12 DIAGNOSIS — F3170 Bipolar disorder, currently in remission, most recent episode unspecified: Secondary | ICD-10-CM

## 2017-04-12 DIAGNOSIS — E785 Hyperlipidemia, unspecified: Secondary | ICD-10-CM | POA: Insufficient documentation

## 2017-04-12 DIAGNOSIS — D509 Iron deficiency anemia, unspecified: Secondary | ICD-10-CM | POA: Diagnosis present

## 2017-04-12 DIAGNOSIS — I10 Essential (primary) hypertension: Secondary | ICD-10-CM | POA: Diagnosis present

## 2017-04-12 DIAGNOSIS — F40240 Claustrophobia: Secondary | ICD-10-CM | POA: Insufficient documentation

## 2017-04-12 DIAGNOSIS — Z955 Presence of coronary angioplasty implant and graft: Secondary | ICD-10-CM | POA: Insufficient documentation

## 2017-04-12 DIAGNOSIS — F319 Bipolar disorder, unspecified: Secondary | ICD-10-CM

## 2017-04-12 DIAGNOSIS — D259 Leiomyoma of uterus, unspecified: Secondary | ICD-10-CM | POA: Insufficient documentation

## 2017-04-12 DIAGNOSIS — F32A Depression, unspecified: Secondary | ICD-10-CM

## 2017-04-12 DIAGNOSIS — F329 Major depressive disorder, single episode, unspecified: Secondary | ICD-10-CM | POA: Insufficient documentation

## 2017-04-12 DIAGNOSIS — Z7902 Long term (current) use of antithrombotics/antiplatelets: Secondary | ICD-10-CM | POA: Insufficient documentation

## 2017-04-12 DIAGNOSIS — D500 Iron deficiency anemia secondary to blood loss (chronic): Secondary | ICD-10-CM | POA: Insufficient documentation

## 2017-04-12 DIAGNOSIS — Z7982 Long term (current) use of aspirin: Secondary | ICD-10-CM | POA: Insufficient documentation

## 2017-04-12 DIAGNOSIS — I2510 Atherosclerotic heart disease of native coronary artery without angina pectoris: Secondary | ICD-10-CM | POA: Insufficient documentation

## 2017-04-12 HISTORY — DX: Chest pain, unspecified: R07.9

## 2017-04-12 HISTORY — DX: Unstable angina: I20.0

## 2017-04-12 LAB — URINALYSIS, ROUTINE W REFLEX MICROSCOPIC
BILIRUBIN URINE: NEGATIVE
Glucose, UA: NEGATIVE mg/dL
HGB URINE DIPSTICK: NEGATIVE
Ketones, ur: NEGATIVE mg/dL
Leukocytes, UA: NEGATIVE
Nitrite: NEGATIVE
PH: 5 (ref 5.0–8.0)
Protein, ur: NEGATIVE mg/dL
Specific Gravity, Urine: 1.016 (ref 1.005–1.030)

## 2017-04-12 LAB — CBC
HCT: 34.3 % — ABNORMAL LOW (ref 36.0–46.0)
Hemoglobin: 9.8 g/dL — ABNORMAL LOW (ref 12.0–15.0)
MCH: 20.9 pg — ABNORMAL LOW (ref 26.0–34.0)
MCHC: 28.6 g/dL — AB (ref 30.0–36.0)
MCV: 73.1 fL — AB (ref 78.0–100.0)
Platelets: 275 10*3/uL (ref 150–400)
RBC: 4.69 MIL/uL (ref 3.87–5.11)
RDW: 17.3 % — AB (ref 11.5–15.5)
WBC: 8.3 10*3/uL (ref 4.0–10.5)

## 2017-04-12 LAB — RETICULOCYTES
RBC.: 4.75 MIL/uL (ref 3.87–5.11)
RETIC COUNT ABSOLUTE: 61.8 10*3/uL (ref 19.0–186.0)
Retic Ct Pct: 1.3 % (ref 0.4–3.1)

## 2017-04-12 LAB — I-STAT TROPONIN, ED: Troponin i, poc: 0.01 ng/mL (ref 0.00–0.08)

## 2017-04-12 LAB — BASIC METABOLIC PANEL
Anion gap: 9 (ref 5–15)
BUN: 15 mg/dL (ref 6–20)
CHLORIDE: 105 mmol/L (ref 101–111)
CO2: 26 mmol/L (ref 22–32)
Calcium: 9.3 mg/dL (ref 8.9–10.3)
Creatinine, Ser: 0.97 mg/dL (ref 0.44–1.00)
GFR calc Af Amer: >60 mL/min (ref >60)
GFR calc non Af Amer: >60 mL/min (ref >60)
GLUCOSE: 117 mg/dL — AB (ref 65–99)
Potassium: 3.8 mmol/L (ref 3.5–5.1)
Sodium: 140 mmol/L (ref 135–145)

## 2017-04-12 LAB — IRON AND TIBC
IRON: 22 ug/dL — AB (ref 28–170)
Saturation Ratios: 4 % — ABNORMAL LOW (ref 10.4–31.8)
TIBC: 491 ug/dL — ABNORMAL HIGH (ref 250–450)
UIBC: 469 ug/dL

## 2017-04-12 LAB — TROPONIN I
Troponin I: <0.03 ng/mL (ref <0.03)
Troponin I: <0.03 ng/mL (ref <0.03)
Troponin I: <0.03 ng/mL (ref <0.03)

## 2017-04-12 LAB — FERRITIN: Ferritin: 8 ng/mL — ABNORMAL LOW (ref 11–307)

## 2017-04-12 LAB — VITAMIN B12: VITAMIN B 12: 281 pg/mL (ref 180–914)

## 2017-04-12 LAB — LIPID PANEL
CHOL/HDL RATIO: 4.7 ratio
CHOLESTEROL: 161 mg/dL (ref 0–200)
HDL: 34 mg/dL — AB (ref >40)
LDL Cholesterol: 87 mg/dL (ref 0–99)
Triglycerides: 198 mg/dL — ABNORMAL HIGH (ref <150)
VLDL: 40 mg/dL (ref 0–40)

## 2017-04-12 LAB — FOLATE: FOLATE: 13.4 ng/mL (ref >5.9)

## 2017-04-12 MED ORDER — ALPRAZOLAM 0.25 MG PO TABS: 0.2500 mg | ORAL_TABLET | Freq: Two times a day (BID) | ORAL | Status: DC | PRN

## 2017-04-12 MED ORDER — ONDANSETRON HCL 4 MG/2ML IJ SOLN
4.0000 mg | Freq: Once | INTRAMUSCULAR | Status: AC
  Administered 2017-04-12: 4 mg via INTRAVENOUS
  Filled 2017-04-12: qty 2

## 2017-04-12 MED ORDER — GI COCKTAIL ~~LOC~~
30.0000 mL | Freq: Four times a day (QID) | ORAL | Status: DC | PRN
  Administered 2017-04-12: 30 mL via ORAL
  Filled 2017-04-12: qty 30

## 2017-04-12 MED ORDER — ONDANSETRON HCL 4 MG/2ML IJ SOLN: 4.0000 mg | Freq: Four times a day (QID) | INTRAMUSCULAR | Status: DC | PRN

## 2017-04-12 MED ORDER — HYDROCHLOROTHIAZIDE 25 MG PO TABS
12.5000 mg | ORAL_TABLET | Freq: Every day | ORAL | Status: DC
  Administered 2017-04-12 – 2017-04-13 (×2): 12.5 mg via ORAL
  Filled 2017-04-12 (×2): qty 1

## 2017-04-12 MED ORDER — LISINOPRIL 10 MG PO TABS
5.0000 mg | ORAL_TABLET | Freq: Every day | ORAL | Status: DC
  Administered 2017-04-12 – 2017-04-13 (×2): 5 mg via ORAL
  Filled 2017-04-12 (×2): qty 1

## 2017-04-12 MED ORDER — MORPHINE SULFATE (PF) 4 MG/ML IV SOLN
1.0000 mg | INTRAVENOUS | Status: DC | PRN
Start: 2017-04-12 — End: 2017-04-13

## 2017-04-12 MED ORDER — FENTANYL CITRATE (PF) 100 MCG/2ML IJ SOLN
50.0000 ug | Freq: Once | INTRAMUSCULAR | Status: AC
  Administered 2017-04-12: 50 ug via INTRAVENOUS
  Filled 2017-04-12: qty 2

## 2017-04-12 MED ORDER — ASPIRIN EC 81 MG PO TBEC
81.0000 mg | DELAYED_RELEASE_TABLET | Freq: Every day | ORAL | Status: DC
  Administered 2017-04-12 – 2017-04-13 (×2): 81 mg via ORAL
  Filled 2017-04-12 (×2): qty 1

## 2017-04-12 MED ORDER — ACETAMINOPHEN 325 MG PO TABS: 650.0000 mg | ORAL_TABLET | ORAL | Status: DC | PRN

## 2017-04-12 MED ORDER — TICAGRELOR 90 MG PO TABS
90.0000 mg | ORAL_TABLET | Freq: Two times a day (BID) | ORAL | Status: DC
  Administered 2017-04-12 – 2017-04-13 (×2): 90 mg via ORAL
  Filled 2017-04-12 (×2): qty 1

## 2017-04-12 NOTE — ED Notes (Signed)
Dr. Marily Memos rounding at bedside

## 2017-04-12 NOTE — Progress Notes (Signed)
Spoke with patient at length concerning pain and issues for coming to hospital. Patient feels her pain is more related to stress over what is happening in her personal life.  She would like to have a referral for a sleep study for sleep apnea.  She is trying to appeal a home test her work gave her. She feels that she should not have to wear a CPAP machine. States that MD told her because she snored she would need it.  She has been out of work since 03/24/17 and is having difficulty getting issues taken care of to return to work.  She is planning to start new job but needs sleep apnea addressed first.  Patient grateful to be able to talk about issues. Pt resting with call bell within reach.  Will continue to monitor. Payton Emerald, RN

## 2017-04-12 NOTE — H&P (Signed)
History and Physical    Emma Turner MGQ:[id number] DOB: 17-May-1966 DOA: 04/12/2017   PCP: Patient, No Pcp Per   Patient coming from:  Home    Chief Complaint: Chest pain   HPI: Emma Turner is a 51 y.o. female with medical history significant for Bipolar disorder, depression, sleep apnea, chronic iron deficiency anemia, HTN, CAD sp stent placement mid LAD for severe single vessel coronary artery disease on 5/24/201  following s/p NSTEMI . She also had moderate mid residual RCA stenosis. Patient has been taking her aspirin and Brilinta since discharge on 5/26,  presenting to the ED with ongoing chest pain since discharge, constant, radiating to neck and left shoulder. Denies diaphoresis, nausea or vomiting.Pain unchanged with movement, and cannot be reproduced on palpation. Denies shortness of breath or cough. Denies fever or chills. Reports mild dizziness without falls headaches or vision changes. Continues to smoke 1/2 ppd. No ETOH or recreational drugs. She is also complaining of pain on the left groin at the cath site but denies any leg swelling or calf pain. Denies any bleeding issues.   ED Course:  BP (!) 155/71   Pulse 78   Temp 98.5 F (36.9 C) (Oral)   Resp 16   Ht 5\' 4"  (1.626 m)   Wt 104.3 kg (230 lb)   LMP 03/18/2017   SpO2 98%   BMI 39.48 kg/m    EKG SR without significant changes from prior, QTC 442  Tn less than 0.03 hemoglobin 9.8, at baseline, and MCV 73.1 glucose 117 BMP unremarkable chest x-ray negative for acute disease received fentanyl, and Zofran   Review of Systems:  As per HPI otherwise all other systems reviewed and are negative  Past Medical History:  Diagnosis Date  . Bipolar 1 disorder ([place])   . Coronary artery disease   . Depression   . Hypertension   . Sleep apnea   . Unstable angina [facility])     Past Surgical History:  Procedure Laterality Date  . CORONARY STENT INTERVENTION Right 04/08/2017   Procedure: Coronary Stent Intervention;   Surgeon: Sherren Mocha, MD;  Location: [facility];  Service: Cardiovascular;  Laterality: Right;  . ECTOPIC PREGNANCY SURGERY    . FOOT SURGERY    . INTRAVASCULAR PRESSURE WIRE/FFR STUDY Right 04/08/2017   Procedure: Intravascular Pressure Wire/FFR Study;  Surgeon: Sherren Mocha, MD;  Location: [facility];  Service: Cardiovascular;  Laterality: Right;  . LEFT HEART CATH AND CORONARY ANGIOGRAPHY N/A 04/08/2017   Procedure: Left Heart Cath and Coronary Angiography;  Surgeon: Sherren Mocha, MD;  Location: [facility];  Service: Cardiovascular;  Laterality: N/A;    Social History Social History   Social History  . Marital status: Divorced    Spouse name: N/A  . Number of children: N/A  . Years of education: N/A   Occupational History  . truck driver    Social History Main Topics  . Smoking status: Current Every Day Smoker    Packs/day: 2.00    Types: Cigarettes  . Smokeless tobacco: Never Used  . Alcohol use No  . Drug use: Yes    Types: Marijuana     Comment: Consuming daily; has not used in 1 month   . Sexual activity: Yes    Birth control/ protection: None   Other Topics Concern  . Not on file   Social History Narrative  . No narrative on file     No Known Allergies  Family History  Problem  Relation Age of Onset  . Kidney cancer Mother   . Heart attack Brother        In 59s  . Drug abuse Brother   . Stroke Father       Prior to Admission medications   Medication Sig Start Date End Date Taking? Authorizing Provider  aspirin EC 81 MG tablet Take 1 tablet (81 mg total) by mouth daily. 04/09/17 04/09/18 Yes Rosita Fire, MD  hydrochlorothiazide (HYDRODIURIL) 12.5 MG tablet Take 1 tablet (12.5 mg total) by mouth daily. 04/09/17 04/09/18 Yes Rosita Fire, MD  lisinopril (PRINIVIL,ZESTRIL) 5 MG tablet Take 1 tablet (5 mg total) by mouth daily. 04/09/17 04/09/18 Yes Rosita Fire, MD  ticagrelor (BRILINTA) 90 MG TABS  tablet Take 1 tablet (90 mg total) by mouth 2 (two) times daily. 04/09/17  Yes Rosita Fire, MD    Physical Exam:  Vitals:   04/12/17 0645 04/12/17 0700 04/12/17 0715 04/12/17 0730  BP: (!) 115/58 108/66 (!) 112/55 (!) 155/71  Pulse: 74 76 74 78  Resp: 17 18 16 16   Temp:      TempSrc:      SpO2: 93% 94% 94% 98%  Weight:      Height:       Constitutional: NAD, ill appearing, anxious  Eyes: PERRL, lids and conjunctivae normal ENMT: Mucous membranes are moist, without exudate or lesions  Neck: normal, supple, no masses, no thyromegaly Respiratory: clear to auscultation bilaterally, no wheezing, no crackles. Normal respiratory effort  Cardiovascular: Regular rate and rhythm, no murmurs, rubs or gallops. No extremity edema. 2+ pedal pulses. No carotid bruits.  Abdomen: Soft, non tender, No hepatosplenomegaly. Bowel sounds positive.  Musculoskeletal: no clubbing / cyanosis. Moves all extremities. Pain is not reproducible Skin: no jaundice, No lesions.  Neurologic: Sensation intact  Strength equal in all extremities Psychiatric:   Alert and oriented x 3. Anxious mood.     Labs on Admission: I have personally reviewed following labs and imaging studies  CBC:  Recent Labs Lab 04/08/17 0159 04/09/17 0441 04/12/17 0403  WBC 7.8 7.8 8.3  HGB 10.0* 9.3* 9.8*  HCT 35.1* 33.3* 34.3*  MCV 73.3* 73.5* 73.1*  PLT 302 244 062    Basic Metabolic Panel:  Recent Labs Lab 04/08/17 0159 04/08/17 0223 04/09/17 0441 04/12/17 0403  NA 138  --  140 140  K 3.4*  --  3.7 3.8  CL 105  --  109 105  CO2 25  --  26 26  GLUCOSE 142*  --  131* 117*  BUN 12  --  8 15  CREATININE 0.88  --  0.75 0.97  CALCIUM 9.0  --  8.7* 9.3  MG  --  2.0  --   --     GFR: Estimated Creatinine Clearance: 80.7 mL/min (by C-G formula based on SCr of 0.97 mg/dL).  Liver Function Tests: No results for input(s): AST, ALT, ALKPHOS, BILITOT, PROT, ALBUMIN in the last 168 hours. No results for  input(s): LIPASE, AMYLASE in the last 168 hours. No results for input(s): AMMONIA in the last 168 hours.  Coagulation Profile:  Recent Labs Lab 04/08/17 1035  INR 1.01    Cardiac Enzymes:  Recent Labs Lab 04/08/17 0459 04/08/17 0722 04/09/17 1012 04/09/17 1439 04/12/17 0729  TROPONINI <0.03 <0.03 0.08* 0.07* <0.03    BNP (last 3 results) No results for input(s): PROBNP in the last 8760 hours.  HbA1C: No results for input(s): HGBA1C in the last 72 hours.  CBG: No results for input(s): GLUCAP in the last 168 hours.  Lipid Profile: No results for input(s): CHOL, HDL, LDLCALC, TRIG, CHOLHDL, LDLDIRECT in the last 72 hours.  Thyroid Function Tests: No results for input(s): TSH, T4TOTAL, FREET4, T3FREE, THYROIDAB in the last 72 hours.  Anemia Panel: No results for input(s): VITAMINB12, FOLATE, FERRITIN, TIBC, IRON, RETICCTPCT in the last 72 hours.  Urine analysis:    Component Value Date/Time   LABSPEC 1.020 07/03/2012 1143   PHURINE 5.5 07/03/2012 1143   GLUCOSEU NEGATIVE 07/03/2012 1143   HGBUR LARGE (A) 07/03/2012 1143   BILIRUBINUR NEGATIVE 07/03/2012 1143   KETONESUR NEGATIVE 07/03/2012 1143   PROTEINUR 30 (A) 07/03/2012 1143   UROBILINOGEN 0.2 07/03/2012 1143   NITRITE NEGATIVE 07/03/2012 1143   LEUKOCYTESUR NEGATIVE 07/03/2012 1143    Sepsis Labs: @LABRCNTIP (procalcitonin:4,lacticidven:4) )No results found for this or any previous visit (from the past 240 hour(s)).   Radiological Exams on Admission: Dg Chest 2 View  Result Date: 04/12/2017 CLINICAL DATA:  Acute onset of left-sided chest pain and arm pain. Initial encounter. EXAM: CHEST  2 VIEW COMPARISON:  Chest radiograph performed 04/08/2017 FINDINGS: The lungs are well-aerated and clear. There is no evidence of focal opacification, pleural effusion or pneumothorax. The heart is normal in size; the mediastinal contour is within normal limits. No acute osseous abnormalities are seen. IMPRESSION: No  acute cardiopulmonary process seen. Electronically Signed   By: Garald Balding M.D.   On: 04/12/2017 04:38    EKG: Independently reviewed.  Assessment/Plan Active Problems:   Chest pain   Essential hypertension   Iron deficiency anemia   Tobacco abuse   Depression   Bipolar disorder (HCC)   OSA (obstructive sleep apnea)   Chest pain syndrome, cardiac versus musculoskeletal vs anxiety.history of CAD sp stent placement mid LAD for severe single vessel coronary artery disease on 5/24/201  following s/p NSTEMI . She also had moderate mid residual RCA stenosis. Patient has been taking her aspirin and Brilinta since discharge on 5/26,  HEART score 4  Troponin less than  0.03 , EKG without evidence of ACS. Did not take NTG. Chest pain is constant. CXR unrevealing.    Admit to Telemetry/ Observation Chest pain order set Cycle troponins EKG in am continue ASA, O2 and NTG as needed Continue Brillinta GI cocktail Check A1C and lipid panel Appreciate Cards consultation, patient will be kept NPO with sips for meds in case that cardiac catheterization is indicated.    Hypertension BP (!) 115/58   Pulse 74   Controlled Continue home anti-hypertensive medications with HCTZ and ACE I    Depression/ Bipolar disorder  Patient not on meds Xanax bid prn Patient will need PCP follow up as outpatient   OSA  CPAP nightly   Anemia of Iron deficiency, patient not on meds. Of note, chest pain may have anemia component. Current Hb 9.8, at baseline, and MCV 73.1 Repeat CBC in am  No transfusion is indicated at this time Check anemia panel  WIll likely need Iron supplement on discharge   Tobacco abuse, currently at 1/2 ppd. OfferedNicotine patch vs gum, patient declined  Counseled cessation     DVT prophylaxis:  SCD's  Until cleared by Cards,then Lovenox Code Status:   Full     Family Communication:  Discussed with patient Disposition Plan: Expect patient to be discharged to home after  condition improves Consults called:    Cardiology Admission status:Tele Obs    Rondel Jumbo, PA-C [facility]  04/12/2017, 8:43 AM

## 2017-04-12 NOTE — ED Triage Notes (Addendum)
Pt reports CP ongoing since admission earlier this week (5/24). States pain "worried" her so she decided to come to ED. Associated SHOB and nausea. Pain in L chest radiating to L arm and neck. Stent placed two days ago.

## 2017-04-12 NOTE — ED Notes (Signed)
Patient transported to X-ray 

## 2017-04-12 NOTE — Progress Notes (Signed)
Received signout from Law PA-C  51 year old female just discharged home 2 days ago after having a NSTEMI with PCI to the LAD.  There was residual moderate stenosis of the right mid RCA. Patient was taking Brilinta and aspirin as prescribed, but reports continued chest pain radiating into the left side of her neck unchanged along with nausea and lightheadedness. Initial troponin negative and EKG unchanged. Advised ED physician to discuss with cardiology who recommended for TR[facility] to admit for trending of cardiac enzymes.

## 2017-04-12 NOTE — ED Provider Notes (Deleted)
51 year old female discharged 2 days ago following a non-STEMI and PCI on the lesion of the LAD without other significant coronary artery disease. She comes in complaining of ongoing pain in her chest which of which is getting worse. It is constant and was present when she was discharged. There is radiation of pain up into her neck and she is also complaining of pain in her groin. PCI was done through her wrist. ECG is unchanged and initial troponin is decreased from when she was discharged. I have explained to her that I doubt her pain is cardiac in nature. Her stent has eliminated any myocardium at risk, and she does not have symptoms suggestive of catastrophic stent failure. She will be admitted for serial troponins. During this time, she will need to be educated regarding her cardiac condition and the fact that noncardiac things can cause pain. I'm concerned that there is a possibility that she could become essentially a cardiac cripple if she thinks that every thing in her chest is from her heart.  Medical screening examination/treatment/procedure(s) were conducted as a shared visit with non-physician practitioner(s) and myself.  I personally evaluated the patient during the encounter.   EKG Interpretation  Date/Time:  Monday Apr 12 2017 04:05:26 EDT Ventricular Rate:  83 PR Interval:    QRS Duration: 96 QT Interval:  376 QTC Calculation: 442 R Axis:   58 Text Interpretation:  Sinus rhythm Borderline short PR interval When compared with ECG of 04/09/2017, No significant change was found Confirmed by Delora Fuel ([id number]) on 04/12/2017 4:09:18 AM       EKG Interpretation  Date/Time:  Monday Apr 12 2017 05:08:59 EDT Ventricular Rate:  71 PR Interval:    QRS Duration: 104 QT Interval:  404 QTC Calculation: 439 R Axis:   73 Text Interpretation:  Sinus rhythm Borderline short PR interval Borderline T wave abnormalities Baseline wander in lead(s) V2 When compared with ECG of EARLIER SAME DATE  No significant change was found Confirmed by Delora Fuel ([id number]) on 04/12/2017 2:08:02 AM         Delora Fuel, MD 23/36/12 [phone]

## 2017-04-12 NOTE — ED Provider Notes (Signed)
[facility] DEPT Provider Note   CSN: [id number] Arrival date & time: 04/12/17  0355     History   Chief Complaint Chief Complaint  Patient presents with  . Chest Pain    HPI Emma Turner is a 51 y.o. female with history of CAD, hypertension who was recently discharged from the hospital after cardiac catheterization who presents with ongoing chest pain and shortness of breath since her admission. Patient had a drug-eluting stent placed in her mid LAD for severe single vessel coronary artery disease on 04/08/2017. She also had moderate mid RCA stenosis. Patient has been taking her aspirin and Brilinta since discharge. Patient has had associated nausea and lightheadedness. Her lightheadedness began around 1 PM yesterday. Patient has had some radiation of her chest pain to her left neck and shoulder. Her chest pain is left-sided and has been persistent since her discharge. Her symptoms have not changed significantly since she was discharged from the hospital. Patient reports she developed some mild epigastric to periumbilical pain yesterday. She denies any urinary symptoms or vomiting. No new leg pain or swelling.  HPI  Past Medical History:  Diagnosis Date  . Bipolar 1 disorder ([place])   . Coronary artery disease   . Depression   . Hypertension   . Sleep apnea   . Unstable angina [facility])     Patient Active Problem List   Diagnosis Date Noted  . Chest pain 04/08/2017  . Essential hypertension 04/08/2017  . Iron deficiency anemia due to chronic blood loss 04/08/2017  . Hypokalemia 04/08/2017  . Unstable angina pectoris ([place]) 04/08/2017  . Tobacco abuse   . Unstable angina ([place])   . PTSD (post-traumatic stress disorder) 06/17/2013  . Legal circumstances 06/17/2013  . Homeless 06/17/2013    Past Surgical History:  Procedure Laterality Date  . CORONARY STENT INTERVENTION Right 04/08/2017   Procedure: Coronary Stent Intervention;  Surgeon: Sherren Mocha, MD;  Location: [place] CV LAB;  Service: Cardiovascular;  Laterality: Right;  . ECTOPIC PREGNANCY SURGERY    . FOOT SURGERY    . INTRAVASCULAR PRESSURE WIRE/FFR STUDY Right 04/08/2017   Procedure: Intravascular Pressure Wire/FFR Study;  Surgeon: Sherren Mocha, MD;  Location: [place] CV LAB;  Service: Cardiovascular;  Laterality: Right;  . LEFT HEART CATH AND CORONARY ANGIOGRAPHY N/A 04/08/2017   Procedure: Left Heart Cath and Coronary Angiography;  Surgeon: Sherren Mocha, MD;  Location: [place] [facility];  Service: Cardiovascular;  Laterality: N/A;    OB History    No data available       Home Medications    Prior to Admission medications   Medication Sig Start Date End Date Taking? Authorizing Provider  aspirin EC 81 MG tablet Take 1 tablet (81 mg total) by mouth daily. 04/09/17 04/09/18 Yes Rosita Fire, MD  hydrochlorothiazide (HYDRODIURIL) 12.5 MG tablet Take 1 tablet (12.5 mg total) by mouth daily. 04/09/17 04/09/18 Yes Rosita Fire, MD  lisinopril (PRINIVIL,ZESTRIL) 5 MG tablet Take 1 tablet (5 mg total) by mouth daily. 04/09/17 04/09/18 Yes Rosita Fire, MD  ticagrelor (BRILINTA) 90 MG TABS tablet Take 1 tablet (90 mg total) by mouth 2 (two) times daily. 04/09/17  Yes Rosita Fire, MD    Family History Family History  Problem Relation Age of Onset  . Kidney cancer Mother   . Heart attack Brother        In 93s  . Drug abuse Brother   . Stroke Father     Social History Social  History  Substance Use Topics  . Smoking status: Current Every Day Smoker    Packs/day: 2.00    Types: Cigarettes  . Smokeless tobacco: Never Used  . Alcohol use No     Allergies   Patient has no known allergies.   Review of Systems Review of Systems  Constitutional: Negative for chills and fever.  HENT: Negative for facial swelling and sore throat.   Respiratory: Positive for shortness of breath.   Cardiovascular: Positive for chest pain.  Gastrointestinal:  Positive for nausea. Negative for abdominal pain and vomiting.  Genitourinary: Negative for dysuria.  Musculoskeletal: Negative for back pain.  Skin: Negative for rash and wound.  Neurological: Positive for light-headedness. Negative for headaches.  Psychiatric/Behavioral: The patient is not nervous/anxious.      Physical Exam Updated Vital Signs BP (!) 121/57   Pulse 78   Temp 98.5 F (36.9 C) (Oral)   Resp 18   Ht 5\' 4"  (1.626 m)   Wt 104.3 kg (230 lb)   LMP 03/18/2017   SpO2 93%   BMI 39.48 kg/m   Physical Exam  Constitutional: She appears well-developed and well-nourished. No distress.  HENT:  Head: Normocephalic and atraumatic.  Mouth/Throat: Oropharynx is clear and moist. No oropharyngeal exudate.  Eyes: Conjunctivae are normal. Pupils are equal, round, and reactive to light. Right eye exhibits no discharge. Left eye exhibits no discharge. No scleral icterus.  Neck: Normal range of motion. Neck supple. No thyromegaly present.  Cardiovascular: Normal rate, regular rhythm, normal heart sounds and intact distal pulses.  Exam reveals no gallop and no friction rub.   No murmur heard. Pulmonary/Chest: Effort normal and breath sounds normal. No stridor. No respiratory distress. She has no wheezes. She has no rales.  Abdominal: Soft. Bowel sounds are normal. She exhibits no distension. There is no tenderness. There is no rebound and no guarding.  Musculoskeletal: She exhibits no edema.  Lymphadenopathy:    She has no cervical adenopathy.  Neurological: She is alert. Coordination normal.  Skin: Skin is warm and dry. No rash noted. She is not diaphoretic. No pallor.  Psychiatric: She has a normal mood and affect.  Nursing note and vitals reviewed.    ED Treatments / Results  Labs (all labs ordered are listed, but only abnormal results are displayed) Labs Reviewed  BASIC METABOLIC PANEL - Abnormal; Notable for the following:       Result Value   Glucose, Bld 117 (*)     All other components within normal limits  CBC - Abnormal; Notable for the following:    Hemoglobin 9.8 (*)    HCT 34.3 (*)    MCV 73.1 (*)    MCH 20.9 (*)    MCHC 28.6 (*)    RDW 17.3 (*)    All other components within normal limits  I-STAT TROPOININ, ED    EKG  EKG Interpretation  Date/Time:  Monday Apr 12 2017 05:08:59 EDT Ventricular Rate:  71 PR Interval:    QRS Duration: 104 QT Interval:  404 QTC Calculation: 439 R Axis:   73 Text Interpretation:  Sinus rhythm Borderline short PR interval Borderline T wave abnormalities Baseline wander in lead(s) V2 When compared with ECG of EARLIER SAME DATE No significant change was found Confirmed by Delora Fuel ([id number]) on 04/12/2017 5:36:58 AM       Radiology Dg Chest 2 View  Result Date: 04/12/2017 CLINICAL DATA:  Acute onset of left-sided chest pain and arm pain. Initial encounter. EXAM: CHEST  2 VIEW COMPARISON:  Chest radiograph performed 04/08/2017 FINDINGS: The lungs are well-aerated and clear. There is no evidence of focal opacification, pleural effusion or pneumothorax. The heart is normal in size; the mediastinal contour is within normal limits. No acute osseous abnormalities are seen. IMPRESSION: No acute cardiopulmonary process seen. Electronically Signed   By: Garald Balding M.D.   On: 04/12/2017 04:38    Procedures Procedures (including critical care time)  Medications Ordered in ED Medications  fentaNYL (SUBLIMAZE) injection 50 mcg (50 mcg Intravenous Given 04/12/17 0546)  ondansetron (ZOFRAN) injection 4 mg (4 mg Intravenous Given 04/12/17 0546)     Initial Impression / Assessment and Plan / ED Course  I have reviewed the triage vital signs and the nursing notes.  Pertinent labs & imaging results that were available during my care of the patient were reviewed by me and considered in my medical decision making (see chart for details).     Patient with persistent chest pain after stent placement on 04/08/2017.  Initial troponin 0.01. CBC shows stable chronic anemia, hemoglobin 9.8. BMP is unremarkable. CXR is negative. Patient given fentanyl for pain and Zofran for nausea. I consulted Dr. Radford Pax with cardiology who recommended hospitalist admission for cycling troponins. I spoke with Dr. Tamala Julian with [facility] who will admit the patient for further management. Patient also evaluated by Dr. Roxanne Mins who guided the patient's management and agrees with plan.  Final Clinical Impressions(s) / ED Diagnoses   Final diagnoses:  Chest pain, unspecified type    New Prescriptions New Prescriptions   No medications on file     Caryl Ada 35/67/01 [id number]    Delora Fuel, MD 12/16/41 [phone]

## 2017-04-12 NOTE — Progress Notes (Signed)
04/12/2017- Respiratory care note- Pt declined use of CPAP machine.  Feels that there was an error in the diagnosis of sleep apnea and declined use of CPAP machine while in the hospital this visit.

## 2017-04-13 ENCOUNTER — Encounter (HOSPITAL_COMMUNITY): Payer: Self-pay | Admitting: Cardiology

## 2017-04-13 ENCOUNTER — Telehealth (HOSPITAL_COMMUNITY): Payer: Self-pay

## 2017-04-13 DIAGNOSIS — R072 Precordial pain: Secondary | ICD-10-CM

## 2017-04-13 DIAGNOSIS — R079 Chest pain, unspecified: Secondary | ICD-10-CM | POA: Diagnosis not present

## 2017-04-13 DIAGNOSIS — I10 Essential (primary) hypertension: Secondary | ICD-10-CM | POA: Diagnosis not present

## 2017-04-13 LAB — HEMOGLOBIN A1C
Hgb A1c MFr Bld: 6.1 % — ABNORMAL HIGH (ref 4.8–5.6)
MEAN PLASMA GLUCOSE: 128 mg/dL

## 2017-04-13 MED ORDER — ISOSORBIDE MONONITRATE ER 30 MG PO TB24
15.0000 mg | ORAL_TABLET | Freq: Every day | ORAL | Status: DC
  Administered 2017-04-13: 15 mg via ORAL
  Filled 2017-04-13: qty 1

## 2017-04-13 MED ORDER — CLOPIDOGREL BISULFATE 75 MG PO TABS
75.0000 mg | ORAL_TABLET | Freq: Every day | ORAL | Status: DC
  Administered 2017-04-13: 75 mg via ORAL
  Filled 2017-04-13: qty 1

## 2017-04-13 NOTE — Progress Notes (Signed)
PROGRESS NOTE    Piya Mesch  ZWC:[id number] DOB: 03/04/1966 DOA: 04/12/2017 PCP: Patient, No Pcp Per    Brief Narrative:  51 year old female just discharged home 2 days ago after having a NSTEMI with PCI to the LAD.  There was residual moderate stenosis of the right mid RCA. Patient was taking Brilinta and aspirin as prescribed, but reports continued chest pain radiating into the left side of her neck unchanged along with nausea and lightheadedness.  Presented for chest pain rule out.   Assessment & Plan:   Active Problems:   Chest pain - Consulted Cardiology    Essential hypertension -  HCTZ, Imdur, Lisinopril. Stable on this regimen    Iron deficiency anemia - stable will reassess next am    Tobacco abuse   Depression   Bipolar disorder (HCC)   OSA (obstructive sleep apnea)   DVT prophylaxis: SCD's Code Status: Full Family Communication: none Disposition Plan:  Once cleared by cardiology for discharge   Consultants:   Cardiology   Procedures: None   Antimicrobials: None   Subjective: The patient has no new complaints. No acute issues overnight  Objective: Vitals:   04/12/17 1529 04/12/17 2044 04/13/17 0500 04/13/17 1355  BP: 131/68 (!) 114/43 132/71 136/75  Pulse: 67 72 66 75  Resp: 18 18 18 18   Temp:  97.6 F (36.4 C) 98.1 F (36.7 C) 98.7 F (37.1 C)  TempSrc:  Oral Oral Oral  SpO2: 98% 98% 96% 100%  Weight:      Height:        Intake/Output Summary (Last 24 hours) at 04/13/17 1614 Last data filed at 04/13/17 1200  Gross per 24 hour  Intake              590 ml  Output                0 ml  Net              590 ml   Filed Weights   04/12/17 0406 04/12/17 0922  Weight: 104.3 kg (230 lb) 47.3 kg (104 lb 3.2 oz)    Examination:  General exam: Appears calm and comfortable , In no acute distress Respiratory system: Clear to auscultation. Respiratory effort normal. Cardiovascular system: S1 & S2 heard, RRR.  Gastrointestinal system:  Abdomen is nondistended, soft and nontender.  Central nervous system: Alert and oriented. No focal neurological deficits. Extremities: Symmetric 5 x 5 power. Skin: No rashes, lesions or ulcers, on limited exam Psychiatry:  Mood & affect appropriate.     Data Reviewed: I have personally reviewed following labs and imaging studies  CBC:  Recent Labs Lab 04/08/17 0159 04/09/17 0441 04/12/17 0403  WBC 7.8 7.8 8.3  HGB 10.0* 9.3* 9.8*  HCT 35.1* 33.3* 34.3*  MCV 73.3* 73.5* 73.1*  PLT 302 244 235   Basic Metabolic Panel:  Recent Labs Lab 04/08/17 0159 04/08/17 0223 04/09/17 0441 04/12/17 0403  NA 138  --  140 140  K 3.4*  --  3.7 3.8  CL 105  --  109 105  CO2 25  --  26 26  GLUCOSE 142*  --  131* 117*  BUN 12  --  8 15  CREATININE 0.88  --  0.75 0.97  CALCIUM 9.0  --  8.7* 9.3  MG  --  2.0  --   --    GFR: Estimated Creatinine Clearance: 51.2 mL/min (by C-G formula based on SCr of 0.97 mg/dL). Liver Function Tests:  No results for input(s): AST, ALT, ALKPHOS, BILITOT, PROT, ALBUMIN in the last 168 hours. No results for input(s): LIPASE, AMYLASE in the last 168 hours. No results for input(s): AMMONIA in the last 168 hours. Coagulation Profile:  Recent Labs Lab 04/08/17 1035  INR 1.01   Cardiac Enzymes:  Recent Labs Lab 04/09/17 1012 04/09/17 1439 04/12/17 0729 04/12/17 0919 04/12/17 1246  TROPONINI 0.08* 0.07* <0.03 <0.03 <0.03   BNP (last 3 results) No results for input(s): PROBNP in the last 8760 hours. HbA1C:  Recent Labs  04/12/17 0919  HGBA1C 6.1*   CBG: No results for input(s): GLUCAP in the last 168 hours. Lipid Profile:  Recent Labs  04/12/17 0919  CHOL 161  HDL 34*  LDLCALC 87  TRIG 198*  CHOLHDL 4.7   Thyroid Function Tests: No results for input(s): TSH, T4TOTAL, FREET4, T3FREE, THYROIDAB in the last 72 hours. Anemia Panel:  Recent Labs  04/12/17 0919  VITAMINB12 281  FOLATE 13.4  FERRITIN 8*  TIBC 491*  IRON 22*    RETICCTPCT 1.3   Sepsis Labs: No results for input(s): PROCALCITON, LATICACIDVEN in the last 168 hours.  No results found for this or any previous visit (from the past 240 hour(s)).       Radiology Studies: Dg Chest 2 View  Result Date: 04/12/2017 CLINICAL DATA:  Acute onset of left-sided chest pain and arm pain. Initial encounter. EXAM: CHEST  2 VIEW COMPARISON:  Chest radiograph performed 04/08/2017 FINDINGS: The lungs are well-aerated and clear. There is no evidence of focal opacification, pleural effusion or pneumothorax. The heart is normal in size; the mediastinal contour is within normal limits. No acute osseous abnormalities are seen. IMPRESSION: No acute cardiopulmonary process seen. Electronically Signed   By: Garald Balding M.D.   On: 04/12/2017 04:38    Scheduled Meds: . aspirin EC  81 mg Oral Daily  . clopidogrel  75 mg Oral Daily  . hydrochlorothiazide  12.5 mg Oral Daily  . isosorbide mononitrate  15 mg Oral Daily  . lisinopril  5 mg Oral Daily   Continuous Infusions:   LOS: 0 days    Time spent: > 35 minutes  Velvet Bathe, MD [facility] Pager [phone]  If 7PM-7AM, please contact night-coverage [email or web address] Password [email or web address] 04/13/2017, 4:14 PM

## 2017-04-13 NOTE — Consult Note (Signed)
Cardiology Consultation:   Patient ID: Emma Turner; [id number]; Feb 19, 1966   Admit date: 04/12/2017 Date of Consult: 04/13/2017  Primary Care Provider: Patient, No Pcp Per Primary Cardiologist: Dr. Radford Pax   Patient Profile:   Emma Turner is a 51 y.o. female with a hx of CAD with recent Non-stemi and stent to mid LAD with 50% RCA stenosis (04/08/17), sleep apnea, hypertension, depression, Bipolar 1 disorder and smoking who is being seen today for the evaluation of chest pain at the request of Dr. Wendee Beavers.  History of Present Illness:   Emma Turner was recently hospitalized 5/24-5/25 for chest pain with troponin of 0.08 (post cath), with significant CVD risk factors of obesity, smoking, untreated hypertension and family history. She had had a negative stress test 8 months prior. She was taken to the cath lab and found to have 80% stenosis of the mid LAD and DES was placed. She also had 50% RCA stenosis. 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 She was given a home overnight sleep test and she does not believe that she has sleep apnea. She also does not want to use CPAP as she is claustrophobic. Her company will not allow her to drive with this untreated.   The patient had on going chest discomfort after cath with central chest pressure radiating to the left neck and left arm and is sometimes "stinging". She says that immediately after the cath she had dyspnea and felt that she could not get a breath. She continues to have intermittent mild shortness of breath. The chest pressure became worse and she presented to the ED at about 2:30 am yesterday. She has also had occ lightheadedness and felt fluttering off and on for about an hour yesterday morning. She attributes her current symptoms to stress related to her inability to work as a Administrator due to a possible diagnosis of sleep apnea. She states that she had trouble with anxiety and she  has been very stressed lately. Her symptoms are mildly reminiscent of the symptoms that she had prior to receiving the stent, but she is vague. She continues to have mild chest discomfort sometimes intermittent, sometimes constant for several hours. She states that she has been compliant with her meds including Brilinta, aspirin and BP meds.   Her troponins have been negative X 4, EKG without acute ischemic changes, CXR without any significant findings.   Past Medical History:  Diagnosis Date  . Bipolar 1 disorder ([place])   . Chest pain 04/12/2017  . Coronary artery disease 04/08/2017   A. Non-STEMI- Cath: DES to mid LAD, 50% stenosis of RCA  . Depression   . Hypertension   . Sleep apnea   . Unstable angina [facility])     Past Surgical History:  Procedure Laterality Date  . CORONARY STENT INTERVENTION Right 04/08/2017   Procedure: Coronary Stent Intervention;  Surgeon: Sherren Mocha, MD;  Location: [place] [facility];  Service: Cardiovascular;  Laterality: Right;  . ECTOPIC PREGNANCY SURGERY    . FOOT SURGERY    . INTRAVASCULAR PRESSURE WIRE/FFR STUDY Right 04/08/2017   Procedure: Intravascular Pressure Wire/FFR Study;  Surgeon: Sherren Mocha, MD;  Location: [place] [facility];  Service: Cardiovascular;  Laterality: Right;  . LEFT HEART CATH AND CORONARY ANGIOGRAPHY N/A 04/08/2017   Procedure: Left Heart Cath and Coronary Angiography;  Surgeon: Sherren Mocha, MD;  Location: [place] [facility];  Service: Cardiovascular;  Laterality: N/A;  Inpatient Medications: Scheduled Meds: . aspirin EC  81 mg Oral Daily  . hydrochlorothiazide  12.5 mg Oral Daily  . lisinopril  5 mg Oral Daily  . ticagrelor  90 mg Oral BID   Continuous Infusions:  PRN Meds: acetaminophen, ALPRAZolam, gi cocktail, morphine injection, ondansetron (ZOFRAN) IV  Allergies:   No Known Allergies  Social History:   Social History   Social History  . Marital status: Divorced    Spouse name: N/A  . Number of  children: N/A  . Years of education: N/A   Occupational History  . truck driver    Social History Main Topics  . Smoking status: Current Every Day Smoker    Packs/day: 2.00    Types: Cigarettes  . Smokeless tobacco: Never Used  . Alcohol use No  . Drug use: Yes    Types: Marijuana     Comment: Consuming daily; has not used in 1 month   . Sexual activity: Yes    Birth control/ protection: None   Other Topics Concern  . Not on file   Social History Narrative  . No narrative on file    Family History:   The patient's family history includes Drug abuse in her brother; Heart attack in her brother; Kidney cancer in her mother; Stroke in her father.  ROS:  Please see the history of present illness.  All other ROS reviewed and negative.     Physical Exam/Data:   Vitals:   04/12/17 1322 04/12/17 1529 04/12/17 2044 04/13/17 0500  BP: (!) 116/55 131/68 (!) 114/43 132/71  Pulse: 64 67 72 66  Resp: 18 18 18 18   Temp:   97.6 F (36.4 C) 98.1 F (36.7 C)  TempSrc:   Oral Oral  SpO2: 96% 98% 98% 96%  Weight:      Height:        Intake/Output Summary (Last 24 hours) at 04/13/17 1104 Last data filed at 04/12/17 1900  Gross per 24 hour  Intake              240 ml  Output              700 ml  Net             -460 ml   Filed Weights   04/12/17 0406 04/12/17 0922  Weight: 230 lb (104.3 kg) 104 lb 3.2 oz (47.3 kg)   Body mass index is 17.89 kg/m.  General:  Well nourished, well developed, in no acute distress HEENT: normal Lymph: no adenopathy Neck: no JVD Endocrine:  No thryomegaly Vascular: No carotid bruits; FA pulses 2+ bilaterally without bruits  Cardiac:  normal S1, S2; RRR; no murmur  Lungs:  clear to auscultation bilaterally, no wheezing, rhonchi or rales  Abd: soft, nontender, no hepatomegaly  Ext: no edema Musculoskeletal:  No deformities, BUE and BLE strength normal and equal Skin: warm and dry  Neuro:  CNs 2-12 intact, no focal abnormalities noted Psych:   Normal affect    EKG:  The EKG was personally reviewed and demonstrates: 04/12/2017 0405: NSR 83 bpm, no ischemic changes, QTC 442 04/12/2017 0508: NSR 71 bpm, subtle non-specific T wave changes in V3, V4, QTC 439  Relevant CV Studies:  Cardiac catheterization 04/08/2017 Conclusion   1. Severe single-vessel coronary artery disease involving the mid LAD, treated successfully with a single drug-eluting stent after performing pressure wire analysis  2. Moderate mid RCA stenosis  3. Widely patent left main and left circumflex  4. Normal left ventricular function with LVEF 65%  Recommendations: Dual antiplatelet therapy with aspirin and brilinta for 1 year. Should be okay for hospital discharge tomorrow morning if no complications arise.     Laboratory Data:  Chemistry  Recent Labs Lab 04/08/17 0159 04/09/17 0441 04/12/17 0403  NA 138 140 140  K 3.4* 3.7 3.8  CL 105 109 105  CO2 25 26 26   GLUCOSE 142* 131* 117*  BUN 12 8 15   CREATININE 0.88 0.75 0.97  CALCIUM 9.0 8.7* 9.3  GFRNONAA >60 >60 >60  GFRAA >60 >60 >60  ANIONGAP 8 5 9     No results for input(s): PROT, ALBUMIN, AST, ALT, ALKPHOS, BILITOT in the last 168 hours. Hematology  Recent Labs Lab 04/08/17 0159 04/09/17 0441 04/12/17 0403 04/12/17 0919  WBC 7.8 7.8 8.3  --   RBC 4.79 4.53 4.69 4.75  HGB 10.0* 9.3* 9.8*  --   HCT 35.1* 33.3* 34.3*  --   MCV 73.3* 73.5* 73.1*  --   MCH 20.9* 20.5* 20.9*  --   MCHC 28.5* 27.9* 28.6*  --   RDW 16.8* 16.8* 17.3*  --   PLT 302 244 275  --    Cardiac Enzymes  Recent Labs Lab 04/08/17 0722 04/09/17 1012 04/09/17 1439 04/12/17 0729 04/12/17 0919 04/12/17 1246  TROPONINI <0.03 0.08* 0.07* <0.03 <0.03 <0.03     Recent Labs Lab 04/08/17 0231 04/12/17 0411  TROPIPOC 0.00 0.01    BNP  Recent Labs Lab 04/08/17 0221  BNP 31.4    DDimer No results for input(s): DDIMER in the last 168 hours.  Radiology/Studies:  Dg Chest 2 View  Result Date:  04/12/2017 CLINICAL DATA:  Acute onset of left-sided chest pain and arm pain. Initial encounter. EXAM: CHEST  2 VIEW COMPARISON:  Chest radiograph performed 04/08/2017 FINDINGS: The lungs are well-aerated and clear. There is no evidence of focal opacification, pleural effusion or pneumothorax. The heart is normal in size; the mediastinal contour is within normal limits. No acute osseous abnormalities are seen. IMPRESSION: No acute cardiopulmonary process seen. Electronically Signed   By: Garald Balding M.D.   On: 04/12/2017 04:38    Assessment and Plan:   1. Chest pain -Recent stent to mid LAD on 04/08/17, 50% stenosis of RCA. Discharged home on aspirin and Brilinita (plans to reevaluate at follow up whether pt will need to switch to Plavix after a month due to cost). Not started on BB due to borderline heart rate in low 60's. Currently not on statin, LDL 87. Pt is reluctant to add more medications at this time.  -Pt has had ongoing, since cath, chest pressure radiating to the left neck and arm, sometimes intermittent and sometimes constant for hours, also has intermittent shortness of breath, may be related to Brilinta. She also has a great deal of anxiety and this may be related.  -Troponins negative X4 -No acute ischemic changes on EKG -CXR shows no acute cardiopulmonary processes.  -With no objective evidence of ischemia, would not take pt back to cath lab. -Continue medications, except consider switching Brilinta to Plavix for complaints of intermittent dyspnea. Can add Imdur 15 mg to treat possible microvascular disease.   2. Hypertension -Pt had been off of her meds for 2-1/2 years as she thought they were not working. Started on hydrochlorothiazide 12.5 mg and lisinopril 5 mg during last hospitalization. -Blood pressure is currently well controlled.   3. Sleep apnea -Pt had overnight home study per her work. She  was told that she likely has sleep apnea and has been unable to work as a Dietitian. She does not think that she actually has sleep apnea and is reluctant to have a sleep study as she does not want to use CPAP. This is causing her a great deal of anxiety.  -Pt refused CPAP here last night  4. Anemia -Hgb 9.8, stable from 5/25 9.3 -Has history of severe anemia related to fibroids and menorrhagia -May have some impact on chest discomfort -IM is following, checking anemia panel  5. Tobacco use -2 PPD recently down to 1/2 PPD -Counseled on prudence of smoking cessation in the setting of CAD. She is actively trying to quit.  6. Hyperlipidemia -lipid panel on 04/12/17: LDL 87, Trig 198. Advise statin for CVD risk reduction with LDL goal <70. Pt is reluctant to start another medication at present. Would revisit at follow up.   Signed, Daune Perch, NP  04/13/2017 11:04 AM   I have personally seen and examined this patient with Daune Perch, NP. I agree with the assessment and plan as outlined above. Ms. Mcelhinney had a DES placed in the mid LAD 5 days ago. Her chest pain did not change following her PCI. She has a moderate stenosis in her RCA. She was discharged home on ASA and Brilinta. Now admitted with ongoing dyspnea and mild chest  Pressure. Troponin negative. EKG without ischemic changes. I have personally reviewed her EKG and it shows sinus rhythm with baseline ST abnormality, unchanged from prior EKG. I have reviewed her labs.  My exam shows:  General: Well developed, well nourished, NAD  HEENT: OP clear, mucus membranes moist  SKIN: warm, dry. No rashes. Neuro: No focal deficits  Musculoskeletal: Muscle strength 5/5 all ext  Psychiatric: Mood and affect normal  Neck: No JVD, no carotid bruits, no thyromegaly, no lymphadenopathy.  Lungs:Clear bilaterally, no wheezes, rhonci, crackles Cardiovascular: Regular rate and rhythm. No murmurs, gallops or rubs. Abdomen:Soft. Bowel sounds present. Non-tender.  Extremities: No lower extremity edema. Pulses are 2 + in the  bilateral DP/PT. Plan: She has atypical chest pain and pressure with dyspnea. This could be related to her Brilinta. We will change the Brilinta to Plavix. Will start Imdur for possible microvascular angina. No evidence of ACS. No plans for repeat cath.   Lauree Chandler 04/13/2017 1:36 PM

## 2017-04-13 NOTE — Telephone Encounter (Signed)
Verified [employer] insurance benefits through [employer]. No Copay, Coinsurance 20%, with 36 visits Deductible $1496.00, pt has met $1130.68. Out of Pocket $3750.00, pt has met $1280.68. Reference [phone].... KJ

## 2017-04-13 NOTE — Progress Notes (Signed)
Pt is advised that MD said she is not medically ready to be discharged. Pt said she is still going to leave the hospital. Pt refused to sign the "[place] Advice" form, and walked out of the unit. This event was witnessed by Ellwood Dense, myself and other staff member at the nursing station.  MD notified

## 2017-04-13 NOTE — Progress Notes (Signed)
Pt wants to go home. Wants the IV removed. Now removing the cardiac monitor. In the attempt to find out why she wants to go home, pt states "I am just going to keep giving excuses because I want to go home" after saying things like she "need to take shower but cannot use this soap," has a "general checkup appointment" with her doctor tomorrow, and her IV site is bothering her."   MD is notified via text page.

## 2017-04-16 ENCOUNTER — Ambulatory Visit: Payer: 59 | Attending: Internal Medicine | Admitting: Licensed Clinical Social Worker

## 2017-04-16 ENCOUNTER — Encounter: Payer: Self-pay | Admitting: Physician Assistant

## 2017-04-16 ENCOUNTER — Ambulatory Visit: Payer: 59 | Attending: Physician Assistant | Admitting: Physician Assistant

## 2017-04-16 VITALS — BP 130/84 | HR 83 | Temp 97.8°F | Resp 18 | Ht 64.0 in | Wt 224.0 lb

## 2017-04-16 DIAGNOSIS — R109 Unspecified abdominal pain: Secondary | ICD-10-CM | POA: Insufficient documentation

## 2017-04-16 DIAGNOSIS — F329 Major depressive disorder, single episode, unspecified: Secondary | ICD-10-CM | POA: Insufficient documentation

## 2017-04-16 DIAGNOSIS — I10 Essential (primary) hypertension: Secondary | ICD-10-CM | POA: Diagnosis not present

## 2017-04-16 DIAGNOSIS — Z79899 Other long term (current) drug therapy: Secondary | ICD-10-CM | POA: Insufficient documentation

## 2017-04-16 DIAGNOSIS — I2510 Atherosclerotic heart disease of native coronary artery without angina pectoris: Secondary | ICD-10-CM | POA: Insufficient documentation

## 2017-04-16 DIAGNOSIS — R739 Hyperglycemia, unspecified: Secondary | ICD-10-CM | POA: Insufficient documentation

## 2017-04-16 DIAGNOSIS — G4730 Sleep apnea, unspecified: Secondary | ICD-10-CM

## 2017-04-16 DIAGNOSIS — Z5189 Encounter for other specified aftercare: Secondary | ICD-10-CM | POA: Insufficient documentation

## 2017-04-16 DIAGNOSIS — Z5900 Homelessness unspecified: Secondary | ICD-10-CM

## 2017-04-16 DIAGNOSIS — Z720 Tobacco use: Secondary | ICD-10-CM

## 2017-04-16 DIAGNOSIS — R0602 Shortness of breath: Secondary | ICD-10-CM | POA: Insufficient documentation

## 2017-04-16 DIAGNOSIS — I200 Unstable angina: Secondary | ICD-10-CM

## 2017-04-16 DIAGNOSIS — D509 Iron deficiency anemia, unspecified: Secondary | ICD-10-CM | POA: Insufficient documentation

## 2017-04-16 DIAGNOSIS — Z7982 Long term (current) use of aspirin: Secondary | ICD-10-CM | POA: Insufficient documentation

## 2017-04-16 NOTE — Progress Notes (Signed)
Patient ID: Davisha Linthicum, female   DOB: [birth year], 51 y.o.   MRN: [id number]    Olivea Sonnen, is a 51 y.o. female  AOZ:[id number]  NGE:[id number]  DOB - 02-22-1966  Subjective:  Chief Complaint and HPI: Toia Micale is a 51 y.o. female here today to establish care and for a follow up visit After having 2 recent hospitalizations 5/24-5/25/2018 and 5/28-5/29/2018 after leaving what sounds to be AMA.  For CP and cardiac work up.  She is feeling good overall and wants to stop all of her medications.  She doesn't like to take any medications.  She is having some stomach cramping and SOB from the Brilinta.  She sees cardiology on 04/23/2017.    She is a Administrator.  Otherwise, when she isn't working, she is essentially homeless.  She doesn't have family or friends she can stay with.  Bathing/eating/everything is challenging right now.  Cath site on L leg healing well.  No f/c.  Depression screen [facility] 2/9 04/16/2017  Decreased Interest 0  Down, Depressed, Hopeless 0  PHQ - 2 Score 0  Altered sleeping 0  Tired, decreased energy 1  Change in appetite 0  Feeling bad or failure about yourself  0  Trouble concentrating 0  Moving slowly or fidgety/restless 0  Suicidal thoughts 0  PHQ-9 Score 1    GAD 7 : Generalized Anxiety Score 04/16/2017  Nervous, Anxious, on Edge 0  Control/stop worrying 0  Worry too much - different things 0  Trouble relaxing 0  Restless 0  Easily annoyed or irritable 0  Afraid - awful might happen 0  Total GAD 7 Score 0      From 5/25 discharge summary: 51 year old female with history of hypertension, bipolar, tobacco use, microcytic anemia presented with chest pain. In the ER troponin negative with no significant change in EKG. Patient was seen and evaluated by cardiologist and underwent cardiac cath. Patient has very strong family history of coronary artery disease and significant tobacco use history. The cardiac cath showed 80% mid LAD status post successful  drug-eluting stent with residual moderate mid RCA stenosis of 50%. EF of 65%. Patient was started on aspirin and Brilinta. Patient was complaining of mild chest discomfort today morning. Troponin was repeated which was 0.08 likely in the setting of cardiac procedure done yesterday. Reevaluated by Dr. Radford Pax from cardiology. The repeat troponin trending down to 0.07. Patient might have some anxiety. Evaluated by case manager for the medication including aspirin and Brilinta. She has free card for a month supply of Brilinta and then she will follow-up at the cardiology office. At that time to cardiology may decide to change to Plavix. She needs to be on dual antiplatelet agent for at least a year. Continued home medication for hypertension management. Recommended to continue home medicine and follow-up with her PCP. Hypokalemia resolved.  I discussed with Dr. Radford Pax from cardiologist regarding discharge plan and after the second set of troponin of 0.07. She recommended to discharge patient with current regimen and follow-up with cardiology outpatient.   From 5/28 H&P notes(no discharge summary available at the time I am seeing patient:  HPI: Xandrea Clarey is a 51 y.o. female with medical history significant for Bipolar disorder, depression, sleep apnea, chronic iron deficiency anemia, HTN, CAD sp stent placementmid LAD for severe single vessel coronary artery disease on 5/24/201  following s/p NSTEMI . She also had moderate mid residual RCA stenosis. Patient has been taking her aspirin and Brilinta since discharge on 5/26,  presenting to the ED with ongoing chest pain since discharge, constant, radiating to neck and left shoulder. Denies diaphoresis, nausea or vomiting.Pain unchanged with movement, and cannot be reproduced on palpation. Denies shortness of breath or cough. Denies fever or chills. Reports mild dizziness without falls headaches or vision changes. Continues to smoke 1/2 ppd. No ETOH or  recreational drugs. She is also complaining of pain on the left groin at the cath site but denies any leg swelling or calf pain. Denies any bleeding issues.   ED Course:  BP (!) 155/71   Pulse 78   Temp 98.5 F (36.9 C) (Oral)   Resp 16   Ht 5\' 4"  (1.626 m)   Wt 104.3 kg (230 lb)   LMP 03/18/2017   SpO2 98%   BMI 39.48 kg/m    EKG SR without significant changes from prior, QTC 442  Tn less than 0.03 hemoglobin 9.8, at baseline, and MCV 73.1 glucose 117 BMP unremarkable chest x-ray negative for acute disease received fentanyl, and Zofran  Chest pain syndrome, cardiac versus musculoskeletal vs anxiety.history of CAD sp stent placementmid LAD for severe single vessel coronary artery disease on 5/24/201  following s/p NSTEMI . She also had moderate mid residual RCA stenosis. Patient has been taking her aspirin and Brilinta since discharge on 5/26,  HEART score 4  Troponin less than  0.03 , EKG without evidence of ACS. Did not take NTG. Chest pain is constant. CXR unrevealing.    Admit to Telemetry/ Observation Chest pain order set Cycle troponins EKG in am continue ASA, O2 and NTG as needed Continue Brillinta GI cocktail Check A1C and lipid panel Appreciate Cards consultation, patient will be kept NPO with sips for meds in case that cardiac catheterization is indicated.    Hypertension BP (!) 115/58   Pulse 74   Controlled Continue home anti-hypertensive medications with HCTZ and ACE I    Depression/ Bipolar disorder  Patient not on meds Xanax bid prn Patient will need PCP follow up as outpatient   OSA  CPAP nightly Anemia of Iron deficiency, patient not on meds. Of note, chest pain may have anemia component. Current Hb 9.8, at baseline, and MCV 73.1 Repeat CBC in am  No transfusion is indicated at this time Check anemia panel  WIll likely need Iron supplement on discharge   Tobacco abuse, currently at 1/2 ppd. OfferedNicotine patch vs gum, patient  declined  Counseled cessation .   ED/Hospital notes reviewed.    ROS:   Constitutional:  No f/c, No night sweats, No unexplained weight loss. EENT:  No vision changes, No blurry vision, No hearing changes. No mouth, throat, or ear problems.  Respiratory: No cough, Only SOB with Brilinta Cardiac: No CP, no palpitations GI:  No abd pain, No N/V/D. GU: No Urinary s/sx Musculoskeletal: No joint pain Neuro: No headache, no dizziness, no motor weakness.  Skin: No rash Endocrine:  No polydipsia. No polyuria.  Psych: Denies SI/HI  No problems updated.  ALLERGIES: No Known Allergies  PAST MEDICAL HISTORY: Past Medical History:  Diagnosis Date  . Bipolar 1 disorder ([place])   . Chest pain 04/12/2017  . Coronary artery disease 04/08/2017   A.Cath 04/08/17: DES to mid LAD, 50% stenosis of RCA  . Depression   . Hypertension   . Sleep apnea   . Unstable angina ([place])     MEDICATIONS AT HOME: Prior to Admission medications   Medication Sig Start Date End Date Taking? Authorizing Provider  aspirin EC  81 MG tablet Take 1 tablet (81 mg total) by mouth daily. 04/09/17 04/09/18  Rosita Fire, MD  hydrochlorothiazide (HYDRODIURIL) 12.5 MG tablet Take 1 tablet (12.5 mg total) by mouth daily. 04/09/17 04/09/18  Rosita Fire, MD  lisinopril (PRINIVIL,ZESTRIL) 5 MG tablet Take 1 tablet (5 mg total) by mouth daily. 04/09/17 04/09/18  Rosita Fire, MD  ticagrelor (BRILINTA) 90 MG TABS tablet Take 1 tablet (90 mg total) by mouth 2 (two) times daily. 04/09/17   Rosita Fire, MD     Objective:  EXAM:   Vitals:   04/16/17 1101  BP: 130/84  Pulse: 83  Resp: 18  Temp: 97.8 F (36.6 C)  TempSrc: Oral  SpO2: 96%  Weight: 224 lb (101.6 kg)  Height: 5\' 4"  (1.626 m)    General appearance : A&OX3. NAD. Non-toxic-appearing HEENT: Atraumatic and Normocephalic.  PERRLA. EOM intact.  TM clear B. Mouth-MMM, post pharynx WNL w/o erythema, No PND. Neck: supple, no JVD. No  cervical lymphadenopathy. No thyromegaly Chest/Lungs:  Breathing-non-labored, Good air entry bilaterally, breath sounds normal without rales, rhonchi, or wheezing  CVS: S1 S2 regular, no murmurs, gallops, rubs  Abdomen: Bowel sounds present, Non tender and not distended with no gaurding, rigidity or rebound. Extremities: Bilateral Lower Ext shows no edema, both legs are warm to touch with = pulse throughout Neurology:  CN II-XII grossly intact, Non focal.   Psych:  TP linear. J/I WNL. Normal speech. Appropriate eye contact and affect.  Skin:  No Rash  Data Review Lab Results  Component Value Date   HGBA1C 6.1 (H) 04/12/2017     Assessment & Plan   1. Essential hypertension I have urged her to continue HCTZ and Lisinopril though she is resistant - Basic metabolic panel  2. Unstable angina (HCC) S/p stent LAD.  On brilinta-defer to cardiology to consider Plavix - Basic metabolic panel  3. Tobacco abuse Cessation advised  4. Hyperglycemia I have had a lengthy discussion and provided education about insulin resistance and the intake of too much sugar/refined carbohydrates.  I have advised the patient to work at a goal of eliminating sugary drinks, candy, desserts, sweets, refined sugars, processed foods, and white carbohydrates.  The patient expresses understanding.  - Basic metabolic panel  5. Iron deficiency anemia, unspecified iron deficiency anemia type - CBC with Differential/Platelet  We are providing her with a food box and having her meet with Christa See, social worker to give her possible resources.  She was also given personal hygiene items.   Patient have been counseled extensively about nutrition and exercise  Return in about 1 week (around 04/23/2017) for assign PCP; recheck htn and s/p stent placement.  The patient was given clear instructions to go to ER or return to medical center if symptoms don't improve, worsen or new problems develop. The patient verbalized  understanding. The patient was told to call to get lab results if they haven't heard anything in the next week.     Freeman Caldron, PA-C [facility] [place], [place]   04/16/2017, 11:24 AM

## 2017-04-16 NOTE — Patient Instructions (Signed)
Check Blood pressure out of office and record and bring to next visit.   You are pre-diabetic.  Limit sugar/white carbohydrate intake.  Weight loss will also be helpful

## 2017-04-16 NOTE — BH Specialist Note (Signed)
Integrated Behavioral Health Initial Visit  MRN: [id number] Name: Emma Turner   Session Start time: 12:00 PM Session End time: 12:30 PM Total time: 30 minutes  Type of Service: [place] Interpretor:No. Interpretor Name and Language: N/A   Warm Hand Off Completed.       SUBJECTIVE: Emma Turner is a 51 y.o. female accompanied by patient. Patient was referred by Weyman Pedro for community resources. Patient reports the following symptoms/concerns: homelessness Duration of problem: One month; Severity of problem: moderate  OBJECTIVE: Mood: Anxious and Affect: Appropriate Risk of harm to self or others: No plan to harm self or others   LIFE CONTEXT: Family and Social: Pt has no family and friends who reside locally.  School/Work: Pt is employed as a Administrator. She is in the process of applying for Short-Term Disability. Self-Care: Pt is not open to medication management Life Changes: Pt was has been hospitalized on multiple occassions within one month. She is currently unable to work and has no where to reside  GOALS ADDRESSED: Patient will reduce symptoms of: anxiety and increase knowledge and/or ability of: coping skills and also: Increase adequate support systems for patient/family   INTERVENTIONS: Solution-Focused Strategies, Supportive Counseling, Psychoeducation and/or Health Education and Link to [employer]  Standardized Assessments completed: PHQ 2&9  ASSESSMENT: Patient currently experiencing anxiety triggered by declined health which resulted in her inability to return to work. She is currently homeless and has no local support system. Patient may benefit from psychotherapy and medication management. [place] educated pt on how stress can negatively impact one's physical and mental health. LCSWA encouraged pt to apply healthy coping skills to decrease anxiety. Pt was provided information on [place] to  assist with homeless resources, food insecurity, and a listing to emergency shelters.   PLAN: 1. Follow up with behavioral health clinician on : Pt was encouraged to contact LCSWA if symptoms worsen or fail to improve to schedule behavioral appointments at [facility]. 2. Behavioral recommendations: LCSWA recommends that pt apply healthy coping skills discussed and utilize provided resources. Pt is encouraged to schedule follow up appointment with LCSWA 3. Referral(s): Community Resources:  Haematologist and Housing 4. "From scale of 1-10, how likely are you to follow plan?": 8/10  Rebekah Chesterfield, LCSW 04/19/17 3:07 PM

## 2017-04-17 LAB — BASIC METABOLIC PANEL
BUN/Creatinine Ratio: 19 (ref 9–23)
BUN: 15 mg/dL (ref 6–24)
CALCIUM: 9.4 mg/dL (ref 8.7–10.2)
CHLORIDE: 105 mmol/L (ref 96–106)
CO2: 21 mmol/L (ref 18–29)
Creatinine, Ser: 0.79 mg/dL (ref 0.57–1.00)
GFR calc Af Amer: 100 mL/min/{1.73_m2} (ref >59)
GFR calc non Af Amer: 87 mL/min/{1.73_m2} (ref >59)
GLUCOSE: 102 mg/dL — AB (ref 65–99)
POTASSIUM: 4.3 mmol/L (ref 3.5–5.2)
Sodium: 142 mmol/L (ref 134–144)

## 2017-04-17 LAB — CBC WITH DIFFERENTIAL/PLATELET
Basophils Absolute: 0 10*3/uL (ref 0.0–0.2)
Basos: 1 %
EOS (ABSOLUTE): 0.1 10*3/uL (ref 0.0–0.4)
EOS: 2 %
Hematocrit: 35.5 % (ref 34.0–46.6)
Hemoglobin: 10.7 g/dL — ABNORMAL LOW (ref 11.1–15.9)
IMMATURE GRANS (ABS): 0 10*3/uL (ref 0.0–0.1)
IMMATURE GRANULOCYTES: 0 %
LYMPHS: 23 %
Lymphocytes Absolute: 1.5 10*3/uL (ref 0.7–3.1)
MCH: 21.2 pg — ABNORMAL LOW (ref 26.6–33.0)
MCHC: 30.1 g/dL — ABNORMAL LOW (ref 31.5–35.7)
MCV: 70 fL — ABNORMAL LOW (ref 79–97)
MONOS ABS: 0.4 10*3/uL (ref 0.1–0.9)
Monocytes: 6 %
NEUTROS PCT: 68 %
Neutrophils Absolute: 4.3 10*3/uL (ref 1.4–7.0)
PLATELETS: 325 10*3/uL (ref 150–379)
RBC: 5.05 x10E6/uL (ref 3.77–5.28)
RDW: 18.5 % — AB (ref 12.3–15.4)
WBC: 6.3 10*3/uL (ref 3.4–10.8)

## 2017-04-18 ENCOUNTER — Emergency Department (HOSPITAL_COMMUNITY): Payer: 59

## 2017-04-18 ENCOUNTER — Encounter (HOSPITAL_COMMUNITY): Payer: Self-pay | Admitting: Emergency Medicine

## 2017-04-18 ENCOUNTER — Emergency Department (HOSPITAL_COMMUNITY)
Admission: EM | Admit: 2017-04-18 | Discharge: 2017-04-18 | Disposition: A | Discharge status: Home / Self Care | Payer: 59 | Attending: Emergency Medicine | Admitting: Emergency Medicine

## 2017-04-18 ENCOUNTER — Telehealth: Payer: Self-pay | Admitting: Cardiology

## 2017-04-18 DIAGNOSIS — F17210 Nicotine dependence, cigarettes, uncomplicated: Secondary | ICD-10-CM | POA: Insufficient documentation

## 2017-04-18 DIAGNOSIS — I10 Essential (primary) hypertension: Secondary | ICD-10-CM | POA: Insufficient documentation

## 2017-04-18 DIAGNOSIS — Z79899 Other long term (current) drug therapy: Secondary | ICD-10-CM | POA: Insufficient documentation

## 2017-04-18 DIAGNOSIS — I25110 Atherosclerotic heart disease of native coronary artery with unstable angina pectoris: Secondary | ICD-10-CM | POA: Insufficient documentation

## 2017-04-18 DIAGNOSIS — Z7982 Long term (current) use of aspirin: Secondary | ICD-10-CM | POA: Insufficient documentation

## 2017-04-18 DIAGNOSIS — Z955 Presence of coronary angioplasty implant and graft: Secondary | ICD-10-CM | POA: Insufficient documentation

## 2017-04-18 DIAGNOSIS — R0789 Other chest pain: Secondary | ICD-10-CM

## 2017-04-18 LAB — BASIC METABOLIC PANEL
ANION GAP: 11 (ref 5–15)
BUN: 14 mg/dL (ref 6–20)
CALCIUM: 9.1 mg/dL (ref 8.9–10.3)
CO2: 23 mmol/L (ref 22–32)
Chloride: 106 mmol/L (ref 101–111)
Creatinine, Ser: 0.8 mg/dL (ref 0.44–1.00)
Glucose, Bld: 144 mg/dL — ABNORMAL HIGH (ref 65–99)
Potassium: 3.5 mmol/L (ref 3.5–5.1)
Sodium: 140 mmol/L (ref 135–145)

## 2017-04-18 LAB — CBC
HCT: 36 % (ref 36.0–46.0)
HEMOGLOBIN: 10.4 g/dL — AB (ref 12.0–15.0)
MCH: 21 pg — AB (ref 26.0–34.0)
MCHC: 28.9 g/dL — ABNORMAL LOW (ref 30.0–36.0)
MCV: 72.7 fL — ABNORMAL LOW (ref 78.0–100.0)
Platelets: 322 10*3/uL (ref 150–400)
RBC: 4.95 MIL/uL (ref 3.87–5.11)
RDW: 18 % — ABNORMAL HIGH (ref 11.5–15.5)
WBC: 8.8 10*3/uL (ref 4.0–10.5)

## 2017-04-18 LAB — TROPONIN I

## 2017-04-18 LAB — I-STAT TROPONIN, ED: TROPONIN I, POC: 0 ng/mL (ref 0.00–0.08)

## 2017-04-18 MED ORDER — KETOROLAC TROMETHAMINE 15 MG/ML IJ SOLN: 15.0000 mg | Freq: Once | INTRAMUSCULAR | Status: DC

## 2017-04-18 MED ORDER — CLOPIDOGREL BISULFATE 75 MG PO TABS: 75.0000 mg | ORAL_TABLET | Freq: Every day | ORAL | 0 refills | Status: DC

## 2017-04-18 MED ORDER — KETOROLAC TROMETHAMINE 30 MG/ML IJ SOLN
15.0000 mg | Freq: Once | INTRAMUSCULAR | Status: DC
  Filled 2017-04-18: qty 1

## 2017-04-18 MED ORDER — IBUPROFEN 800 MG PO TABS
800.0000 mg | ORAL_TABLET | Freq: Once | ORAL | Status: AC
  Administered 2017-04-18: 800 mg via ORAL
  Filled 2017-04-18: qty 1

## 2017-04-18 NOTE — ED Provider Notes (Signed)
[facility] DEPT Provider Note   CSN: [id number] Arrival date & time: 04/18/17  1639     History   Chief Complaint Chief Complaint  Patient presents with  . Chest Pain    HPI Emma Turner is a 51 y.o. female.  HPI  Patient presents slightly more than 1 week after placement of a LAD stent, now with concern for ongoing chest pain, jaw pain. She notes that since the procedure, and seemingly prior to the procedure she had chest pain. This continues to be present, and is primarily left upper chest, as well as with more severity in the left jaw, left lateral neck. No new dyspnea, which she does note that the dyspnea is persistent, similar to the chest pain. She also associates adherence with her Brilinta with worsening dyspnea. She notes that she stopped taking this medication for 3 days due to dyspnea, but did continue to take her 81 mg aspirin tablet daily. She denies other new concerns including syncope, fever, cough. The patient spoke with the physician assistant today, was referred here for further evaluation.   Past Medical History:  Diagnosis Date  . Bipolar 1 disorder ([place])   . Chest pain 04/12/2017  . Coronary artery disease 04/08/2017   A.Cath 04/08/17: DES to mid LAD, 50% stenosis of RCA  . Depression   . Hypertension   . Sleep apnea   . Unstable angina [facility])     Patient Active Problem List   Diagnosis Date Noted  . Depression 04/12/2017  . Bipolar disorder ([place]) 04/12/2017  . OSA (obstructive sleep apnea) 04/12/2017  . Chest pain 04/08/2017  . Essential hypertension 04/08/2017  . Iron deficiency anemia 04/08/2017  . Hypokalemia 04/08/2017  . Unstable angina pectoris ([place]) 04/08/2017  . Tobacco abuse   . Unstable angina ([place])   . PTSD (post-traumatic stress disorder) 06/17/2013  . Legal circumstances 06/17/2013  . Homeless 06/17/2013    Past Surgical History:  Procedure Laterality Date  . CORONARY STENT INTERVENTION Right 04/08/2017   Procedure:  Coronary Stent Intervention;  Surgeon: Sherren Mocha, MD;  Location: [place] CV LAB;  Service: Cardiovascular;  Laterality: Right;  . ECTOPIC PREGNANCY SURGERY    . FOOT SURGERY    . INTRAVASCULAR PRESSURE WIRE/FFR STUDY Right 04/08/2017   Procedure: Intravascular Pressure Wire/FFR Study;  Surgeon: Sherren Mocha, MD;  Location: [place] CV LAB;  Service: Cardiovascular;  Laterality: Right;  . LEFT HEART CATH AND CORONARY ANGIOGRAPHY N/A 04/08/2017   Procedure: Left Heart Cath and Coronary Angiography;  Surgeon: Sherren Mocha, MD;  Location: [place] CV LAB;  Service: Cardiovascular;  Laterality: N/A;    OB History    No data available       Home Medications    Prior to Admission medications   Medication Sig Start Date End Date Taking? Authorizing Provider  aspirin EC 81 MG tablet Take 1 tablet (81 mg total) by mouth daily. 04/09/17 04/09/18  Rosita Fire, MD  hydrochlorothiazide (HYDRODIURIL) 12.5 MG tablet Take 1 tablet (12.5 mg total) by mouth daily. 04/09/17 04/09/18  Rosita Fire, MD  lisinopril (PRINIVIL,ZESTRIL) 5 MG tablet Take 1 tablet (5 mg total) by mouth daily. 04/09/17 04/09/18  Rosita Fire, MD  ticagrelor (BRILINTA) 90 MG TABS tablet Take 1 tablet (90 mg total) by mouth 2 (two) times daily. 04/09/17   Rosita Fire, MD    Family History Family History  Problem Relation Age of Onset  . Kidney cancer Mother   . Heart attack Brother  In 32s  . Drug abuse Brother   . Stroke Father     Social History Social History  Substance Use Topics  . Smoking status: Current Every Day Smoker    Packs/day: 2.00    Types: Cigarettes  . Smokeless tobacco: Never Used  . Alcohol use No     Allergies   Patient has no known allergies.   Review of Systems Review of Systems  Constitutional:       Per HPI, otherwise negative  HENT:       Per HPI, otherwise negative  Respiratory:       Per HPI, otherwise negative    Cardiovascular:       Per HPI, otherwise negative  Gastrointestinal: Negative for vomiting.  Endocrine:       Negative aside from HPI  Genitourinary:       Neg aside from HPI   Musculoskeletal:       Per HPI, otherwise negative  Skin: Negative.   Neurological: Negative for syncope.  Psychiatric/Behavioral: The patient is nervous/anxious.      Physical Exam Updated Vital Signs BP (!) 151/118 (BP Location: Right Arm)   Pulse (!) 105   Temp 98.3 F (36.8 C) (Oral)   Resp 18   SpO2 100%   Physical Exam  Constitutional: She is oriented to person, place, and time. She appears well-developed and well-nourished. No distress.  HENT:  Head: Normocephalic and atraumatic.  Eyes: Conjunctivae and EOM are normal.  Cardiovascular: Normal rate and regular rhythm.   Pulmonary/Chest: Effort normal and breath sounds normal. No stridor. No respiratory distress.  Abdominal: She exhibits no distension.  Musculoskeletal: She exhibits no edema.  Neurological: She is alert and oriented to person, place, and time. No cranial nerve deficit.  Skin: Skin is warm and dry.  Psychiatric: Her mood appears anxious.  Nursing note and vitals reviewed.    ED Treatments / Results  Labs (all labs ordered are listed, but only abnormal results are displayed) Labs Reviewed  BASIC METABOLIC PANEL - Abnormal; Notable for the following:       Result Value   Glucose, Bld 144 (*)    All other components within normal limits  CBC - Abnormal; Notable for the following:    Hemoglobin 10.4 (*)    MCV 72.7 (*)    MCH 21.0 (*)    MCHC 28.9 (*)    RDW 18.0 (*)    All other components within normal limits  I-STAT TROPOININ, ED    EKG  EKG Interpretation  Date/Time:  Sunday April 18 2017 16:48:22 EDT Ventricular Rate:  99 PR Interval:  110 QRS Duration: 92 QT Interval:  354 QTC Calculation: 454 R Axis:   63 Text Interpretation:  Sinus rhythm with short PR T wave abnormality No significant change since  last tracing Abnormal ekg Confirmed by Carmin Muskrat [phone]) on 04/18/2017 5:34:26 PM       Radiology Dg Chest 2 View  Result Date: 04/18/2017 CLINICAL DATA:  Increasing chest pressure since placement of a cardiac stent 04/11/2017. EXAM: CHEST  2 VIEW COMPARISON:  PA and lateral chest 04/12/2017. FINDINGS: The lungs are clear. Heart size is normal. No pneumothorax or pleural effusion. No acute bony abnormality. Spondylosis noted. IMPRESSION: No acute disease. Electronically Signed   By: Inge Rise M.D.   On: 04/18/2017 17:10    Procedures Procedures (including critical care time)  Medications Ordered in ED Medications  ketorolac (TORADOL) 30 MG/ML injection 15 mg (not administered)  Initial Impression / Assessment and Plan / ED Course  I have reviewed the triage vital signs and the nursing notes.  Pertinent labs & imaging results that were available during my care of the patient were reviewed by me and considered in my medical decision making (see chart for details).  Chart review notable for one ED visit after her initial stent, and then a telephone call today with the physician assistant from cardiology. The prior ED evaluation was reassuring. Seemingly, the patient was to be prescribed Plavix to substitute for Brilinta, but this has not yet occurred.  9:14 PM Patient no distress. Second troponin unremarkable. Though the patient has had some discomfort since her catheterization, 2 negative troponin, nonischemic EKG, there is little suspicion for new ischemia, or stent occlusion, as this is inconsistent with that entity. Patient is hemodynamically stable, and with the aforementioned labs, findings, she was discharged after a new prescription for Plavix was given, per cardiology notes. Patient will follow-up with cardiology this week.  Final Clinical Impressions(s) / ED Diagnoses  Atypical chest pain   Carmin Muskrat, MD 04/18/17 2114

## 2017-04-18 NOTE — Discharge Instructions (Signed)
As discussed, your evaluation today has been largely reassuring.  But, it is important that you monitor your condition carefully, and do not hesitate to return to the ED if you develop new, or concerning changes in your condition.  Otherwise, please follow-up with your physician for appropriate ongoing care.  Our cardiologists have recommended that you switch from Brilinta to Plavix.  Continue to take your aspirin as well.

## 2017-04-18 NOTE — ED Notes (Signed)
Pt requesting IV be placed in L arm and refuses for it to be in her hand.  Bruises noted to L AC and L anterior forearm from previous IVs or blood draws.  Explained to pt that she had better IV access on R arm and had veins in hands that looked ok for IV.  Attempted IV on L forearm without success.  Dr. Vanita Panda notified and orders will be changed to IM medication.

## 2017-04-18 NOTE — ED Triage Notes (Signed)
Pt to ER for worsening chest pressure radiating into left neck and arm that has been persistent since having cardiac stent placed 05/27. Pt reports she has not been on anticoagulation for 3 days because "I hated the way it made me feel." BP 151/118, HR 106 at triage. Pt is poor historian.

## 2017-04-18 NOTE — Telephone Encounter (Signed)
Pt called complaining of chest pain. She apparently has a stent in place. She could not tolerate Brilinta and stopped it for 3 days. She came back to the hospital a few days ago and was to be changed to Plavix but she became upset with the nursing staff and apparently signed out Dora. As best I can tell she never got an Rx for Plavix. She called now complaining of chest and neck pain- advised to come to the ED.  Kerin Ransom PA-C 04/18/2017 4:20 PM

## 2017-04-19 MED FILL — ?CLOPIDOGREL 75 MG TABLET: 75 | 30 days supply | Qty: 30 | Fill #0

## 2017-04-19 MED FILL — CYCLOBENZAPRINE 10 MG TAB: 10 | 10 days supply | Qty: 20 | Fill #0

## 2017-04-20 ENCOUNTER — Other Ambulatory Visit: Payer: Self-pay

## 2017-04-20 ENCOUNTER — Emergency Department (HOSPITAL_COMMUNITY)
Admission: EM | Admit: 2017-04-20 | Discharge: 2017-04-21 | Disposition: A | Discharge status: Home / Self Care | Payer: 59 | Attending: Emergency Medicine | Admitting: Emergency Medicine

## 2017-04-20 ENCOUNTER — Encounter: Payer: Self-pay | Admitting: Cardiology

## 2017-04-20 ENCOUNTER — Emergency Department (HOSPITAL_COMMUNITY): Payer: 59

## 2017-04-20 DIAGNOSIS — R079 Chest pain, unspecified: Secondary | ICD-10-CM

## 2017-04-20 DIAGNOSIS — I10 Essential (primary) hypertension: Secondary | ICD-10-CM | POA: Insufficient documentation

## 2017-04-20 DIAGNOSIS — R072 Precordial pain: Secondary | ICD-10-CM | POA: Insufficient documentation

## 2017-04-20 DIAGNOSIS — F17210 Nicotine dependence, cigarettes, uncomplicated: Secondary | ICD-10-CM | POA: Insufficient documentation

## 2017-04-20 DIAGNOSIS — I2510 Atherosclerotic heart disease of native coronary artery without angina pectoris: Secondary | ICD-10-CM | POA: Insufficient documentation

## 2017-04-20 DIAGNOSIS — Z79899 Other long term (current) drug therapy: Secondary | ICD-10-CM | POA: Insufficient documentation

## 2017-04-20 DIAGNOSIS — Z7982 Long term (current) use of aspirin: Secondary | ICD-10-CM | POA: Insufficient documentation

## 2017-04-20 LAB — CBC
HCT: 33.2 % — ABNORMAL LOW (ref 36.0–46.0)
Hemoglobin: 9.5 g/dL — ABNORMAL LOW (ref 12.0–15.0)
MCH: 21.2 pg — ABNORMAL LOW (ref 26.0–34.0)
MCHC: 28.6 g/dL — ABNORMAL LOW (ref 30.0–36.0)
MCV: 73.9 fL — AB (ref 78.0–100.0)
PLATELETS: 299 10*3/uL (ref 150–400)
RBC: 4.49 MIL/uL (ref 3.87–5.11)
RDW: 18.4 % — ABNORMAL HIGH (ref 11.5–15.5)
WBC: 7.9 10*3/uL (ref 4.0–10.5)

## 2017-04-20 LAB — I-STAT TROPONIN, ED: TROPONIN I, POC: 0 ng/mL (ref 0.00–0.08)

## 2017-04-20 LAB — PROTIME-INR
INR: 1.03
PROTHROMBIN TIME: 13.5 s (ref 11.4–15.2)

## 2017-04-20 LAB — BASIC METABOLIC PANEL
Anion gap: 8 (ref 5–15)
BUN: 11 mg/dL (ref 6–20)
CHLORIDE: 104 mmol/L (ref 101–111)
CO2: 26 mmol/L (ref 22–32)
CREATININE: 0.87 mg/dL (ref 0.44–1.00)
Calcium: 8.7 mg/dL — ABNORMAL LOW (ref 8.9–10.3)
Glucose, Bld: 145 mg/dL — ABNORMAL HIGH (ref 65–99)
Potassium: 3.3 mmol/L — ABNORMAL LOW (ref 3.5–5.1)
SODIUM: 138 mmol/L (ref 135–145)

## 2017-04-20 NOTE — ED Triage Notes (Addendum)
Pt presents with sudden onset of mid-sternal chest pain while at store this afternoon.  Pt reports shortness of breath, reports pain radiates through to her back, reports dizziness.  H/o stent placement 5/24.  Pt reports tingling to L side of her body, reports BLE swelling.

## 2017-04-20 NOTE — ED Provider Notes (Signed)
[facility] DEPT Provider Note   CSN: [id number] Arrival date & time: 04/20/17  2136  By signing my name below, I, Jaquelyn Bitter., attest that this documentation has been prepared under the direction and in the presence of Horton, Barbette Hair, MD. Electronically signed: Jaquelyn Bitter., ED Scribe. 04/21/17. 4:36 AM.   History   Chief Complaint Chief Complaint  Patient presents with  . Chest Pain    HPI Emma Turner is a 51 y.o. female with hx of stint placement who presents to the Emergency Department complaining of chest pain with onset x5 hours. Pt states that while sitting in her car x5 hours ago she developed chest pain that she describes as sharp, stinging. She rates the pain 9/10 and reports it radiating to the L arm. Pt reports chills, headache, tingling, leg swelling. She denies any modifying factors but reports the pain being exacerbated by deep breathing. She denies fever. She is on diuretics. She hx of chest pain but denies hx of this chest pain.    Patient with recent stent placement. Patient had negative troponins prior stent placement but was found to have an LAD lesion. She reports compliance with her medications. She has had several visits to the ER with chest pain since that time and one admission for chest pain rule out. At that time her symptoms improved with a GI cocktail. She is due to call follow-up with cardiology on Friday.  The history is provided by the patient. No language interpreter was used.    Past Medical History:  Diagnosis Date  . Bipolar 1 disorder ([place])   . Chest pain 04/12/2017  . Coronary artery disease 04/08/2017   A.Cath 04/08/17: DES to mid LAD, 50% stenosis of RCA  . Depression   . Hypertension   . Sleep apnea   . Unstable angina [facility])     Patient Active Problem List   Diagnosis Date Noted  . Depression 04/12/2017  . Bipolar disorder ([place]) 04/12/2017  . OSA (obstructive sleep apnea) 04/12/2017  . Chest pain  04/08/2017  . Essential hypertension 04/08/2017  . Iron deficiency anemia 04/08/2017  . Hypokalemia 04/08/2017  . Unstable angina pectoris ([place]) 04/08/2017  . Tobacco abuse   . Unstable angina ([place])   . PTSD (post-traumatic stress disorder) 06/17/2013  . Legal circumstances 06/17/2013  . Homeless 06/17/2013    Past Surgical History:  Procedure Laterality Date  . CORONARY STENT INTERVENTION Right 04/08/2017   Procedure: Coronary Stent Intervention;  Surgeon: Sherren Mocha, MD;  Location: [place] CV LAB;  Service: Cardiovascular;  Laterality: Right;  . ECTOPIC PREGNANCY SURGERY    . FOOT SURGERY    . INTRAVASCULAR PRESSURE WIRE/FFR STUDY Right 04/08/2017   Procedure: Intravascular Pressure Wire/FFR Study;  Surgeon: Sherren Mocha, MD;  Location: [place] CV LAB;  Service: Cardiovascular;  Laterality: Right;  . LEFT HEART CATH AND CORONARY ANGIOGRAPHY N/A 04/08/2017   Procedure: Left Heart Cath and Coronary Angiography;  Surgeon: Sherren Mocha, MD;  Location: [place] CV LAB;  Service: Cardiovascular;  Laterality: N/A;    OB History    No data available       Home Medications    Prior to Admission medications   Medication Sig Start Date End Date Taking? Authorizing Provider  aspirin EC 81 MG tablet Take 1 tablet (81 mg total) by mouth daily. 04/09/17 04/09/18 Yes Rosita Fire, MD  clopidogrel (PLAVIX) 75 MG tablet Take 1 tablet (75 mg total) by mouth daily. 04/18/17  Yes Carmin Muskrat, MD  hydrochlorothiazide (HYDRODIURIL) 12.5 MG tablet Take 1 tablet (12.5 mg total) by mouth daily. 04/09/17 04/09/18 Yes Rosita Fire, MD  lisinopril (PRINIVIL,ZESTRIL) 5 MG tablet Take 1 tablet (5 mg total) by mouth daily. 04/09/17 04/09/18 Yes Rosita Fire, MD  omeprazole (PRILOSEC) 20 MG capsule Take 1 capsule (20 mg total) by mouth daily. 04/21/17   Horton, Barbette Hair, MD    Family History Family History  Problem Relation Age of Onset  . Kidney cancer Mother    . Heart attack Brother        In 54s  . Drug abuse Brother   . Stroke Father     Social History Social History  Substance Use Topics  . Smoking status: Current Every Day Smoker    Packs/day: 2.00    Types: Cigarettes  . Smokeless tobacco: Never Used  . Alcohol use No     Allergies   Patient has no known allergies.   Review of Systems Review of Systems  Constitutional: Positive for chills. Negative for fever.  Respiratory: Negative for shortness of breath.   Cardiovascular: Positive for chest pain and leg swelling.  Gastrointestinal: Negative for abdominal pain, nausea and vomiting.  Neurological: Positive for headaches.  All other systems reviewed and are negative.    Physical Exam Updated Vital Signs BP 119/74   Pulse (!) 59   Temp 98.7 F (37.1 C) (Oral)   Resp 16   SpO2 95%   Physical Exam  Constitutional: She is oriented to person, place, and time. She appears well-developed and well-nourished.  HENT:  Head: Normocephalic and atraumatic.  Cardiovascular: Normal rate, regular rhythm and normal heart sounds.   Pulmonary/Chest: Effort normal. No respiratory distress. She has no wheezes. She exhibits tenderness.  Abdominal: Soft. Bowel sounds are normal. There is no tenderness. There is no guarding.  Musculoskeletal:  Trace lower extremity edema  Neurological: She is alert and oriented to person, place, and time.  Skin: Skin is warm and dry.  Psychiatric: She has a normal mood and affect.  Nursing note and vitals reviewed.    ED Treatments / Results   DIAGNOSTIC STUDIES: Oxygen Saturation is 98% on RA, normal by my interpretation.   COORDINATION OF CARE: 4:36 AM-Discussed next steps with pt. Pt verbalized understanding and is agreeable with the plan.    Labs (all labs ordered are listed, but only abnormal results are displayed) Labs Reviewed  BASIC METABOLIC PANEL - Abnormal; Notable for the following:       Result Value   Potassium 3.3 (*)      Glucose, Bld 145 (*)    Calcium 8.7 (*)    All other components within normal limits  CBC - Abnormal; Notable for the following:    Hemoglobin 9.5 (*)    HCT 33.2 (*)    MCV 73.9 (*)    MCH 21.2 (*)    MCHC 28.6 (*)    RDW 18.4 (*)    All other components within normal limits  D-DIMER, QUANTITATIVE (NOT AT [facility]) - Abnormal; Notable for the following:    D-Dimer, Quant 1.05 (*)    All other components within normal limits  PROTIME-INR  I-STAT TROPOININ, ED  I-STAT TROPOININ, ED    EKG  EKG Interpretation  Date/Time:  Tuesday April 20 2017 21:43:13 EDT Ventricular Rate:  90 PR Interval:  130 QRS Duration: 92 QT Interval:  380 QTC Calculation: 464 R Axis:   40 Text Interpretation:  Normal sinus  rhythm Normal ECG Confirmed by Thayer Jew [phone]) on 04/20/2017 11:45:21 PM       Radiology Dg Chest 2 View  Result Date: 04/20/2017 CLINICAL DATA:  Mid sternal chest pain EXAM: CHEST  2 VIEW COMPARISON:  04/18/2017 FINDINGS: Minimal bibasilar atelectasis. No focal consolidation or effusion. Borderline cardiomegaly. No pneumothorax. IMPRESSION: Borderline cardiomegaly with minimal bibasilar atelectasis. Electronically Signed   By: Donavan Foil M.D.   On: 04/20/2017 22:54   Ct Angio Chest Pe W Or Wo Contrast  Result Date: 04/21/2017 CLINICAL DATA:  Chest pain and shortness of breath. EXAM: CT ANGIOGRAPHY CHEST WITH CONTRAST TECHNIQUE: Multidetector CT imaging of the chest was performed using the standard protocol during bolus administration of intravenous contrast. Multiplanar CT image reconstructions and MIPs were obtained to evaluate the vascular anatomy. CONTRAST:  80 cc Isovue 370 IV COMPARISON:  Radiographs earlier this day. FINDINGS: Cardiovascular: There are no filling defects within the pulmonary arteries to suggest pulmonary embolus. Thoracic aorta is normal in caliber without evidence of dissection. Heart is upper normal in size. Coronary stent in place. No pericardial  effusion. Mediastinum/Nodes: No mediastinal, hilar, or axillary adenopathy. Esophagus is decompressed. Lungs/Pleura: Imaging obtained in expiration. Parenchymal heterogeneity likely secondary to expiratory phase imaging and underlying emphysema. Possible mild bronchial thickening in the lower lobes. Mild paraseptal apical emphysema. Scattered fissural thickening. No pulmonary edema. No pleural fluid. No confluent airspace disease. No pulmonary mass. Upper Abdomen: Splenomegaly is partially included. Musculoskeletal: There are no acute or suspicious osseous abnormalities. Review of the MIP images confirms the above findings. IMPRESSION: 1. No pulmonary embolus. 2. Mild emphysema and bronchial thickening. Electronically Signed   By: Jeb Levering M.D.   On: 04/21/2017 02:46    Procedures Procedures (including critical care time)  Medications Ordered in ED Medications  ketorolac (TORADOL) 15 MG/ML injection 15 mg (15 mg Intravenous Given 04/21/17 0152)  iopamidol (ISOVUE-370) 76 % injection (100 mLs  Contrast Given 04/21/17 0203)  gi cocktail (Maalox,Lidocaine,Donnatal) (30 mLs Oral Given 04/21/17 0346)     Initial Impression / Assessment and Plan / ED Course  I have reviewed the triage vital signs and the nursing notes.  Pertinent labs & imaging results that were available during my care of the patient were reviewed by me and considered in my medical decision making (see chart for details).     Patient presents with chest pain. She has had recurrent chest pain since having a stent placed in May. She has also had admission for chest pain rule out. Previously symptoms of got better with the GI cocktail. She denies any other GI symptoms. She is nontoxic. Some reproducible pain on exam. Vital signs reassuring. Initial EKG unchanged. Troponin 2 negative. She is a Administrator. I obtained a d-dimer to evaluate for PE. This was positive. She was sent to CT for CT PE protocol. This was negative for PE. On  recheck after Toradol, patient states she has persistent 8 out of 10 pain. She was subsequently given a GI cocktail and on recheck is resting comfortably. She states that she feels mostly improved. She has follow-up with cardiology on Friday. Feel she is safe for discharge home with close follow-up with cardiology. Will discharge with omeprazole given improvement with GI cocktail.  After history, exam, and medical workup I feel the patient has been appropriately medically screened and is safe for discharge home. Pertinent diagnoses were discussed with the patient. Patient was given return precautions.   Final Clinical Impressions(s) / ED Diagnoses  Final diagnoses:  Chest pain    New Prescriptions New Prescriptions   OMEPRAZOLE (PRILOSEC) 20 MG CAPSULE    Take 1 capsule (20 mg total) by mouth daily.   I personally performed the services described in this documentation, which was scribed in my presence. The recorded information has been reviewed and is accurate.     Merryl Hacker, MD 04/21/17 [phone]

## 2017-04-21 ENCOUNTER — Ambulatory Visit (HOSPITAL_BASED_OUTPATIENT_CLINIC_OR_DEPARTMENT_OTHER): Payer: 59 | Admitting: Family Medicine

## 2017-04-21 ENCOUNTER — Emergency Department (HOSPITAL_COMMUNITY): Payer: 59

## 2017-04-21 ENCOUNTER — Encounter: Payer: Self-pay | Admitting: Family Medicine

## 2017-04-21 ENCOUNTER — Encounter (HOSPITAL_COMMUNITY): Payer: Self-pay

## 2017-04-21 VITALS — BP 144/80 | HR 66 | Temp 97.4°F | Wt 232.4 lb

## 2017-04-21 DIAGNOSIS — E876 Hypokalemia: Secondary | ICD-10-CM

## 2017-04-21 DIAGNOSIS — Z7902 Long term (current) use of antithrombotics/antiplatelets: Secondary | ICD-10-CM

## 2017-04-21 DIAGNOSIS — I25118 Atherosclerotic heart disease of native coronary artery with other forms of angina pectoris: Secondary | ICD-10-CM | POA: Diagnosis not present

## 2017-04-21 DIAGNOSIS — I25119 Atherosclerotic heart disease of native coronary artery with unspecified angina pectoris: Secondary | ICD-10-CM | POA: Insufficient documentation

## 2017-04-21 DIAGNOSIS — G4730 Sleep apnea, unspecified: Secondary | ICD-10-CM

## 2017-04-21 DIAGNOSIS — Z955 Presence of coronary angioplasty implant and graft: Secondary | ICD-10-CM

## 2017-04-21 DIAGNOSIS — Z590 Homelessness: Secondary | ICD-10-CM | POA: Insufficient documentation

## 2017-04-21 DIAGNOSIS — Z7982 Long term (current) use of aspirin: Secondary | ICD-10-CM | POA: Insufficient documentation

## 2017-04-21 DIAGNOSIS — F319 Bipolar disorder, unspecified: Secondary | ICD-10-CM

## 2017-04-21 DIAGNOSIS — R600 Localized edema: Secondary | ICD-10-CM

## 2017-04-21 DIAGNOSIS — F419 Anxiety disorder, unspecified: Secondary | ICD-10-CM

## 2017-04-21 DIAGNOSIS — I10 Essential (primary) hypertension: Secondary | ICD-10-CM

## 2017-04-21 LAB — I-STAT TROPONIN, ED: TROPONIN I, POC: 0 ng/mL (ref 0.00–0.08)

## 2017-04-21 LAB — D-DIMER, QUANTITATIVE: D-Dimer, Quant: 1.05 ug/mL-FEU — ABNORMAL HIGH (ref 0.00–0.50)

## 2017-04-21 MED ORDER — IOPAMIDOL (ISOVUE-370) INJECTION 76%
INTRAVENOUS | Status: AC
  Administered 2017-04-21: 100 mL
  Filled 2017-04-21: qty 100

## 2017-04-21 MED ORDER — OMEPRAZOLE 20 MG PO CPDR: 20.0000 mg | DELAYED_RELEASE_CAPSULE | Freq: Every day | ORAL | 0 refills | Status: DC

## 2017-04-21 MED ORDER — POTASSIUM CHLORIDE CRYS ER 20 MEQ PO TBCR: 20.0000 meq | EXTENDED_RELEASE_TABLET | Freq: Every day | ORAL | 1 refills | Status: DC

## 2017-04-21 MED ORDER — GI COCKTAIL ~~LOC~~
30.0000 mL | Freq: Once | ORAL | Status: AC
  Administered 2017-04-21: 30 mL via ORAL
  Filled 2017-04-21: qty 30

## 2017-04-21 MED ORDER — KETOROLAC TROMETHAMINE 15 MG/ML IJ SOLN
15.0000 mg | Freq: Once | INTRAMUSCULAR | Status: AC
  Administered 2017-04-21: 15 mg via INTRAVENOUS
  Filled 2017-04-21: qty 1

## 2017-04-21 MED ORDER — HYDROCHLOROTHIAZIDE 25 MG PO TABS: 25.0000 mg | ORAL_TABLET | Freq: Every day | ORAL | 1 refills | Status: DC

## 2017-04-21 NOTE — ED Notes (Signed)
Pt provided with saltine crackers

## 2017-04-21 NOTE — ED Notes (Signed)
Patient transported to CT 

## 2017-04-21 NOTE — Discharge Instructions (Signed)
You were seen today for chest pain. You have been seen several times since her stent placement with reassuring workups.  Your exam and test findings are reassuring at this time.  Follow-up with cardiology as scheduled. He'll be started on an acid reducer given the improvement of her symptoms with a GI cocktail. If you have any new or worsening symptoms she needs to be reevaluated.

## 2017-04-21 NOTE — ED Notes (Signed)
Pt assisted to bedside commode

## 2017-04-21 NOTE — Progress Notes (Signed)
Subjective:  Patient ID: Emma Turner, female    DOB: 1965-12-30  Age: 51 y.o. MRN: [id number]  CC: Hypertension   HPI Emma Turner is a 51 year old female with a history of coronary artery disease (status post DES to the LAD on 03/2017 and currently on aspirin and Plavix), hypertension, bipolar disorder, posttraumatic stress disorder, suspected sleep apnea who presents today for follow-up visit.  She has had recurrent chest pain and presented to the ED since her stent was placed. Last seen at the ED yesterday and discharged early this morning at which time workup by means of troponins were negative, CT angio of the chest was negative for PE. She was treated with GI cocktail with resulting improvement in her chest pain after she was subsequently discharged on omeprazole. Her cardiology appointment comes up on 04/23/17  She informs me today she threw the omeprazole and the trash can because "that stuff will kill you"; she goes on to inform me that it caused her ex-husband kidney failure with resulting death. She does not like to take medications as she has developed nausea and vomiting ever since she got on medications.  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 She has run out of funds and is currently homeless and cannot afford her basic necessities. She is thinking of applying for disability so she can obtain some him form of income.  She takes hydrochlorothiazide for pedal edema but has not noticed any significant improvement.   Past Medical History:  Diagnosis Date  . Bipolar 1 disorder ([place])   . Chest pain 04/12/2017  . Coronary artery disease 04/08/2017   A.Cath 04/08/17: DES to mid LAD, 50% stenosis of RCA  . Depression   . Hypertension   . Sleep apnea   . Unstable angina [facility])     Past  Surgical History:  Procedure Laterality Date  . CORONARY STENT INTERVENTION Right 04/08/2017   Procedure: Coronary Stent Intervention;  Surgeon: Sherren Mocha, MD;  Location: [place] [facility];  Service: Cardiovascular;  Laterality: Right;  . ECTOPIC PREGNANCY SURGERY    . FOOT SURGERY    . INTRAVASCULAR PRESSURE WIRE/FFR STUDY Right 04/08/2017   Procedure: Intravascular Pressure Wire/FFR Study;  Surgeon: Sherren Mocha, MD;  Location: [place] [facility];  Service: Cardiovascular;  Laterality: Right;  . LEFT HEART CATH AND CORONARY ANGIOGRAPHY N/A 04/08/2017   Procedure: Left Heart Cath and Coronary Angiography;  Surgeon: Sherren Mocha, MD;  Location: [place] [facility];  Service: Cardiovascular;  Laterality: N/A;    No Known Allergies   Outpatient Medications Prior to Visit  Medication Sig Dispense Refill  . aspirin EC 81 MG tablet Take 1 tablet (81 mg total) by mouth daily. 30 tablet 0  . clopidogrel (PLAVIX) 75 MG tablet Take 1 tablet (75 mg total) by mouth daily. 30 tablet 0  . hydrochlorothiazide (HYDRODIURIL) 12.5 MG tablet Take 1 tablet (12.5 mg total) by mouth daily. 30 tablet 0  . lisinopril (PRINIVIL,ZESTRIL) 5 MG tablet Take 1 tablet (5 mg total) by mouth daily. 30 tablet 0  . omeprazole (PRILOSEC) 20 MG capsule Take 1 capsule (20 mg total) by mouth daily. (Patient not taking: Reported on 04/21/2017) 30 capsule 0   No facility-administered medications prior to visit.     ROS  Review of Systems  Constitutional: Negative for activity change, appetite change and fatigue.  HENT: Negative for congestion, sinus pressure and sore throat.   Eyes: Negative for visual disturbance.  Respiratory: Negative for cough, chest tightness, shortness of breath and wheezing.   Cardiovascular: Positive for chest pain and leg swelling. Negative for palpitations.  Gastrointestinal: Positive for nausea and vomiting. Negative for abdominal distention, abdominal pain and constipation.  Endocrine:  Negative for polydipsia.  Genitourinary: Negative for dysuria and frequency.  Musculoskeletal: Negative for arthralgias and back pain.  Skin: Negative for rash.  Neurological: Negative for tremors, light-headedness and numbness.  Hematological: Does not bruise/bleed easily.  Psychiatric/Behavioral: Negative for agitation and behavioral problems. The patient is nervous/anxious.     Objective:  BP (!) 144/80   Pulse 66   Temp 97.4 F (36.3 C) (Oral)   Wt 232 lb 6.4 oz (105.4 kg)   SpO2 100%   BMI 39.89 kg/m   BP/Weight 04/21/2017 12/24/3149 05/21/1606  Systolic BP 371 062 694  Diastolic BP 80 74 48  Wt. (Lbs) 232.4 - -  BMI 39.89 - -      Physical Exam  Constitutional: She is oriented to person, place, and time. She appears well-developed and well-nourished.  Cardiovascular: Normal rate, normal heart sounds and intact distal pulses.   No murmur heard. Pulmonary/Chest: Effort normal and breath sounds normal. She has no wheezes. She has no rales. She exhibits no tenderness.  Abdominal: Soft. Bowel sounds are normal. She exhibits no distension and no mass. There is no tenderness.  Musculoskeletal: Normal range of motion. She exhibits edema (1+ bilateral pitting pedal edema).  Neurological: She is alert and oriented to person, place, and time.     Assessment & Plan:   1. Essential hypertension Slightly elevated above goal of less than 130/80 Continue current regimen  2. Coronary artery disease involving native coronary artery of native heart with other form of angina pectoris ([place]) Status post drug-eluting stent to LAD Continue dual antiplatelet therapy with Plavix and aspirin  3. Anxiety She is refusing initiation of medications LCSW not in clinic today; will have her reach out to the patient  4. Pedal edema Hydrochlorothiazide dose increased side effects discussed with the patient  5. Hypokalemia Likely secondary to hydrochlorothiazide Will come in potassium  supplements and recheck potassium levels at her next visit  We have provided her with housing resources in the community as she is currently homeless   No orders of the defined types were placed in this encounter.  This note has been created with Surveyor, quantity. Any transcriptional errors are unintentional.     Follow-up: Return in about 2 weeks (around 05/05/2017) for Follow-up on pedal edema.   Arnoldo Morale MD

## 2017-04-21 NOTE — Patient Instructions (Signed)
Edema Edema is when you have too much fluid in your body or under your skin. Edema may make your legs, feet, and ankles swell up. Swelling is also common in looser tissues, like around your eyes. This is a common condition. It gets more common as you get older. There are many possible causes of edema. Eating too much salt (sodium) and being on your feet or sitting for a long time can cause edema in your legs, feet, and ankles. Hot weather may make edema worse. Edema is usually painless. Your skin may look swollen or shiny. Follow these instructions at home:  Keep the swollen body part raised (elevated) above the level of your heart when you are sitting or lying down.  Do not sit still or stand for a long time.  Do not wear tight clothes. Do not wear garters on your upper legs.  Exercise your legs. This can help the swelling go down.  Wear elastic bandages or support stockings as told by your doctor.  Eat a low-salt (low-sodium) diet to reduce fluid as told by your doctor.  Depending on the cause of your swelling, you may need to limit how much fluid you drink (fluid restriction).  Take over-the-counter and prescription medicines only as told by your doctor. Contact a doctor if:  Treatment is not working.  You have heart, liver, or kidney disease and have symptoms of edema.  You have sudden and unexplained weight gain. Get help right away if:  You have shortness of breath or chest pain.  You cannot breathe when you lie down.  You have pain, redness, or warmth in the swollen areas.  You have heart, liver, or kidney disease and get edema all of a sudden.  You have a fever and your symptoms get worse all of a sudden. Summary  Edema is when you have too much fluid in your body or under your skin.  Edema may make your legs, feet, and ankles swell up. Swelling is also common in looser tissues, like around your eyes.  Raise (elevate) the swollen body part above the level of your  heart when you are sitting or lying down.  Follow your doctor's instructions about diet and how much fluid you can drink (fluid restriction). This information is not intended to replace advice given to you by your health care provider. Make sure you discuss any questions you have with your health care provider. Document Released: 04/20/2008 Document Revised: 11/20/2016 Document Reviewed: 11/20/2016 Elsevier Interactive Patient Education  2017 Elsevier Inc.  

## 2017-04-23 ENCOUNTER — Telehealth: Payer: Self-pay | Admitting: *Deleted

## 2017-04-23 ENCOUNTER — Encounter: Payer: Self-pay | Admitting: Cardiology

## 2017-04-23 ENCOUNTER — Ambulatory Visit (INDEPENDENT_AMBULATORY_CARE_PROVIDER_SITE_OTHER): Payer: 59 | Admitting: Cardiology

## 2017-04-23 ENCOUNTER — Encounter (INDEPENDENT_AMBULATORY_CARE_PROVIDER_SITE_OTHER): Payer: Self-pay

## 2017-04-23 VITALS — BP 126/90 | HR 77 | Ht 64.0 in | Wt 231.0 lb

## 2017-04-23 DIAGNOSIS — R600 Localized edema: Secondary | ICD-10-CM

## 2017-04-23 DIAGNOSIS — E7800 Pure hypercholesterolemia, unspecified: Secondary | ICD-10-CM | POA: Diagnosis not present

## 2017-04-23 DIAGNOSIS — R0609 Other forms of dyspnea: Secondary | ICD-10-CM

## 2017-04-23 DIAGNOSIS — G4733 Obstructive sleep apnea (adult) (pediatric): Secondary | ICD-10-CM

## 2017-04-23 MED ORDER — METOPROLOL TARTRATE 25 MG PO TABS: 12.5000 mg | ORAL_TABLET | Freq: Two times a day (BID) | ORAL | 3 refills | Status: DC

## 2017-04-23 MED ORDER — ATORVASTATIN CALCIUM 40 MG PO TABS: 40.0000 mg | ORAL_TABLET | Freq: Every day | ORAL | 3 refills | Status: DC

## 2017-04-23 NOTE — Patient Instructions (Addendum)
Medication Instructions:  Your physician has recommended you make the following change in your medication:  1-START Metoprolol 12.5 mg by mouth twice daily 2-START Lipitor 40 mg by mouth daily  Labwork: Your physician recommends that you have lab work today- CBC, BNP, BMET Your physician recommends that you return for lab work in: 6 weeks for lipid and liver panel.  Testing/Procedures: NONE  Follow-Up: Your physician wants you to follow-up in: 2 to 3 weeks with Dr. Radford Pax or Mare Loan PA   If you need a refill on your cardiac medications before your next appointment, please call your pharmacy.

## 2017-04-23 NOTE — Telephone Encounter (Addendum)
Per Brittainy Simmons split night sleep study ordered.  Informed patient of upcoming sleep study and patient understanding was verbalized. Patient understands her sleep study is scheduled for Thursday June 24 2017. Patient understands her sleep study will be done at [facility] sleep lab. Patient understands she will receive a sleep packet in a week or so. Patient understands to call if she does not receive the sleep packet in a timely manner. Patient agrees with treatment and thanked me for call.

## 2017-04-23 NOTE — Progress Notes (Signed)
04/23/2017 Emma Turner   1966-06-01  [id number]  Primary Physician Arnoldo Morale, MD Primary Cardiologist: Dr. Radford Pax    Reason for Visit/CC: [facility] f/u for CAD s/p NSTEMI w/ PCI to LAD  HPI:  Emma Turner is a 51 y.o. female who is being seen today for post hospital f/u after recent admission for NSTEMI. She presented to [facility] on 04/08/17 with chest pain. She ruled in for NSTEMI. Troponin peaked at 0.08. Subsequent LHC was performed by Dr. Burt Knack. She was found to have single vessel CAD involving the mid LAD, 80% stenosis, treated successfully with DES. There was moderate mid RCA stenosis, treated medically. The left main and LCx were both widely patent. LVEF was normal at 65%. She was initially placed on DAPT with ASA + Brilinta, however did not tolerate Brilinta due to dyspnea. She had recurrent CP and presented back to the hospital on 04/13/17. She was admitted and cardiac enzymes were cycled but were negative, ruling out acute instent thrombosis. There were also no EKG changes. There were no indications for repeat [facility]. Her CP was felt atypical. She was switched from Brilinta to Plavix. ASA continued.   Additional medical problems include HTN, tobacco use, OSA and Bipolar 1 disorder.   She presents back to clinic for f/u. She denies any recurrent chest pressure/ tightness but has occasional sharp left sided neck pain into her arm. This was the same pain she had when she was evaluated for the 2nd time 5/29 and enzymes were negative. She has mild exertional dyspnea and new complaint of bilateral LEE. She has 1+ bilateral LEE on exam today. She denies orthopnea and PND. She is on HCTZ 25 mg and reports full compliance. She has been avoiding sodium. She also complains of occasional palpitations. She is not on a BB. Her BP is 126/90. HR is 77 bpm. She also reports h/o chronic anemia due to heavy periods. She also has concerns regarding OSA. 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   She is currently asymptomatic in clinic. Physical exam is notable for mild, bilateral LE pitting edema.     Current Meds  Medication Sig  . aspirin EC 81 MG tablet Take 1 tablet (81 mg total) by mouth daily.  . clopidogrel (PLAVIX) 75 MG tablet Take 1 tablet (75 mg total) by mouth daily.  . hydrochlorothiazide (HYDRODIURIL) 25 MG tablet Take 1 tablet (25 mg total) by mouth daily.  Marland Kitchen lisinopril (PRINIVIL,ZESTRIL) 5 MG tablet Take 1 tablet (5 mg total) by mouth daily.  Marland Kitchen omeprazole (PRILOSEC) 20 MG capsule Take 1 capsule (20 mg total) by mouth daily.  . [DISCONTINUED] potassium chloride SA (K-DUR,KLOR-CON) 20 MEQ tablet Take 1 tablet (20 mEq total) by mouth daily.   No Known Allergies Past Medical History:  Diagnosis Date  . Bipolar 1 disorder ([place])   . Chest pain 04/12/2017  . Coronary artery disease 04/08/2017   A.Cath 04/08/17: DES to mid LAD, 50% stenosis of RCA  . Depression   . Hypertension   . Sleep apnea   . Unstable angina ([facility])    Family History  Problem Relation Age of Onset  . Kidney cancer Mother   . Heart attack Brother        In 49s  . Drug abuse Brother   . Stroke Father    Past Surgical History:  Procedure Laterality Date  . CORONARY STENT INTERVENTION Right 04/08/2017   Procedure: Coronary Stent Intervention;  Surgeon: Sherren Mocha, MD;  Location: [place] [facility];  Service: Cardiovascular;  Laterality: Right;  . ECTOPIC PREGNANCY SURGERY    . FOOT SURGERY    . INTRAVASCULAR PRESSURE WIRE/FFR STUDY Right 04/08/2017   Procedure: Intravascular Pressure Wire/FFR Study;  Surgeon: Sherren Mocha, MD;  Location: [place] [facility];  Service: Cardiovascular;  Laterality: Right;  . LEFT HEART CATH AND CORONARY ANGIOGRAPHY N/A 04/08/2017   Procedure: Left Heart Cath and Coronary Angiography;  Surgeon: Sherren Mocha, MD;  Location: [place] [facility];  Service: Cardiovascular;  Laterality: N/A;   Social History   Social  History  . Marital status: Divorced    Spouse name: N/A  . Number of children: N/A  . Years of education: N/A   Occupational History  . truck driver    Social History Main Topics  . Smoking status: Current Every Day Smoker    Packs/day: 2.00    Types: Cigarettes  . Smokeless tobacco: Never Used  . Alcohol use No  . Drug use: Yes    Types: Marijuana     Comment: Consuming daily; has not used in 1 month   . Sexual activity: Yes    Birth control/ protection: None   Other Topics Concern  . Not on file   Social History Narrative  . No narrative on file     Review of Systems: General: negative for chills, fever, night sweats or weight changes.  Cardiovascular: negative for chest pain, dyspnea on exertion, edema, orthopnea, palpitations, paroxysmal nocturnal dyspnea or shortness of breath Dermatological: negative for rash Respiratory: negative for cough or wheezing Urologic: negative for hematuria Abdominal: negative for nausea, vomiting, diarrhea, bright red blood per rectum, melena, or hematemesis Neurologic: negative for visual changes, syncope, or dizziness All other systems reviewed and are otherwise negative except as noted above.   Physical Exam:  Blood pressure 126/90, pulse 77, height 5\' 4"  (1.626 m), weight 231 lb (104.8 kg).  General appearance: alert, cooperative and no distress Neck: no carotid bruit and no JVD Lungs: clear to auscultation bilaterally Heart: regular rate and rhythm, S1, S2 normal, no murmur, click, rub or gallop Extremities: 1+ bilateral pitting edema.  Pulses: 2+ and symmetric Skin: Skin color, texture, turgor normal. No rashes or lesions Neurologic: Grossly normal  EKG not performed -- personally reviewed   ASSESSMENT AND PLAN:   1. CAD: s/p recent NSTEMI 04/08/17. Troponin peaked at 0.08. [facility] showed severe single vessel CAD with 80% mid LAD treated with PCI + DES with mild residual nonobstructive RCA disease, treated medically. LM and Lcx  widely patent. She did not tolerate Brilinta due to dyspnea. Has been switched to Plavix + ASA. Doing better w/ Plavix but still with mild exertional dyspnea and bilateral LEE (mild volume overload). We will add low dose BB, 12.5 mg of metoprolol BID.  2. HTN: Diastolic BP elevated in the 90s. She has CAD. Not currently on a BB. HR in the upper 70s. We will add metoprolol 12.5 mg BID. F/u in 2 weeks for repeat BP check. She may also need transition from HCTZ to Lasix if abnormal BNP. Low sodium diet and smoking cessation also advised.   3. Tobacco Use: smoking cessation strongly advised.   4. HLD: Pt not currently on a statin. LDL was 87 during recent admission. Hepatic function normal. We will add Lipitor 40 mg. Repeat FLP and HFTs in 6 weeks. LDL goal <70 mg/dL.   5. ? OSA: will place referral for sleep study.  6. Bilateral LEE: 1+ bilateral pitting edema noted on exam. May be 2/2 diastolic dysfunction. Recent echo showed normal LVEF. We will check a BNP today. If elevated, will switch HCTZ >>Lasix. Pt advised to avoid salt.    Follow-Up with me in 2-3 weeks.   Shela Esses Ladoris Gene, MHS [facility] 04/23/2017 12:29 PM

## 2017-04-26 ENCOUNTER — Telehealth: Payer: Self-pay

## 2017-04-26 LAB — CBC WITH DIFFERENTIAL/PLATELET
BASOS ABS: 0 10*3/uL (ref 0.0–0.2)
Basos: 0 %
EOS (ABSOLUTE): 0.2 10*3/uL (ref 0.0–0.4)
Eos: 3 %
Hematocrit: 32.5 % — ABNORMAL LOW (ref 34.0–46.6)
Hemoglobin: 9.8 g/dL — ABNORMAL LOW (ref 11.1–15.9)
Immature Grans (Abs): 0 10*3/uL (ref 0.0–0.1)
Immature Granulocytes: 0 %
LYMPHS ABS: 1.3 10*3/uL (ref 0.7–3.1)
Lymphs: 24 %
MCH: 21.2 pg — AB (ref 26.6–33.0)
MCHC: 30.2 g/dL — AB (ref 31.5–35.7)
MCV: 70 fL — ABNORMAL LOW (ref 79–97)
MONOS ABS: 0.4 10*3/uL (ref 0.1–0.9)
Monocytes: 7 %
NEUTROS ABS: 3.5 10*3/uL (ref 1.4–7.0)
Neutrophils: 66 %
Platelets: 300 10*3/uL (ref 150–379)
RBC: 4.62 x10E6/uL (ref 3.77–5.28)
RDW: 19.1 % — AB (ref 12.3–15.4)
WBC: 5.4 10*3/uL (ref 3.4–10.8)

## 2017-04-26 LAB — BASIC METABOLIC PANEL
BUN / CREAT RATIO: 14 (ref 9–23)
BUN: 12 mg/dL (ref 6–24)
CO2: 21 mmol/L (ref 18–29)
CREATININE: 0.83 mg/dL (ref 0.57–1.00)
Calcium: 9.2 mg/dL (ref 8.7–10.2)
Chloride: 104 mmol/L (ref 96–106)
GFR calc Af Amer: 94 mL/min/{1.73_m2} (ref >59)
GFR calc non Af Amer: 82 mL/min/{1.73_m2} (ref >59)
Glucose: 103 mg/dL — ABNORMAL HIGH (ref 65–99)
POTASSIUM: 4.2 mmol/L (ref 3.5–5.2)
SODIUM: 142 mmol/L (ref 134–144)

## 2017-04-26 LAB — PRO B NATRIURETIC PEPTIDE: NT-PRO BNP: 76 pg/mL (ref 0–249)

## 2017-04-26 NOTE — Telephone Encounter (Signed)
-----   Message from Consuelo Pandy, [place] sent at 04/26/2017 11:58 AM EDT ----- Labs look ok. Her anemia is consistent with her baseline. Kidney function and K are normal. BNP is also normal, thus no acute HF. Continue current fluid pill, HCTZ.

## 2017-04-26 NOTE — Discharge Summary (Signed)
Patient presented to the hospital for chest pain. Was undergoing chest pain rule out but patient left AMA. Based on nursing's last notes:  Pt wants to go home. Wants the IV removed. Now removing the cardiac monitor. In the attempt to find out why she wants to go home, pt states "I am just going to keep giving excuses because I want to go home" after saying things like she "need to take shower but cannot use this soap," has a "general checkup appointment" with her doctor tomorrow, and her IV site is bothering her."   MD is notified via text page.    Patient was advised to stay for further workup but eloped.  Zubair Lofton, [employer]

## 2017-04-28 ENCOUNTER — Telehealth (HOSPITAL_COMMUNITY): Payer: Self-pay

## 2017-04-28 NOTE — Telephone Encounter (Signed)
I called and spoke to patient about scheduling for cardiac rehab. Patient declined scheduling for cardiac rehab. Patient stated that she gets plenty of exercise by getting in and out of her car. Patient also stated that she is currently sleeping in her car. Referral canceled.

## 2017-04-29 ENCOUNTER — Telehealth: Payer: Self-pay | Admitting: Cardiology

## 2017-04-29 NOTE — Telephone Encounter (Signed)
[place] is not available until next week.  Please check with medical records about papers.

## 2017-04-29 NOTE — Telephone Encounter (Signed)
Pt called to find out if her extension of medical leave paperwork had been filed out . Pt states that she brought the paperwork to be filed out a week before she was seen by Pa-C on 6/8. Pt states she brought them to the front desk and Emma Turner from medical records came to get them. Emma Simmons PA-C was to filled them out.    I checked with the front desk and with Emma Turner at medical records, there is no records of any medical leave  paperwork. Pt is very upset ,because she states this office have lost her papers and someone needs to find them, because this paperwork  needs to be send to her employer by 6/19th.

## 2017-04-29 NOTE — Telephone Encounter (Signed)
New Message  Pt call requesting to speak with RN to f/u on medical leave paperwork. Please call back to discuss

## 2017-05-03 NOTE — Telephone Encounter (Signed)
Follow up       Pt is calling to ask for a presc for her anxiety.  She says she is "stressed" and needs something.  Please advise

## 2017-05-03 NOTE — Telephone Encounter (Signed)
Informed patient that she will need to consult PCP for anti-anxiety medications.  Informed patient medical leave paperwork will be reviewed by B. Rosita Fire, [place] tomorrow. She requests a call back from a nurse with an update tomorrow.

## 2017-05-04 NOTE — Telephone Encounter (Signed)
Emma Turner signed and completed [employer] paperwork. I called patient she is aware it has been faxed and ready for her to pick up.

## 2017-05-05 ENCOUNTER — Other Ambulatory Visit: Payer: Self-pay

## 2017-05-05 ENCOUNTER — Ambulatory Visit: Payer: 59 | Admitting: Licensed Clinical Social Worker

## 2017-05-05 ENCOUNTER — Ambulatory Visit: Payer: 59 | Attending: Family Medicine | Admitting: *Deleted

## 2017-05-05 DIAGNOSIS — F419 Anxiety disorder, unspecified: Secondary | ICD-10-CM

## 2017-05-05 DIAGNOSIS — F4323 Adjustment disorder with mixed anxiety and depressed mood: Secondary | ICD-10-CM

## 2017-05-05 DIAGNOSIS — R0789 Other chest pain: Secondary | ICD-10-CM

## 2017-05-05 DIAGNOSIS — Z5900 Homelessness unspecified: Secondary | ICD-10-CM

## 2017-05-05 DIAGNOSIS — R079 Chest pain, unspecified: Secondary | ICD-10-CM | POA: Insufficient documentation

## 2017-05-05 DIAGNOSIS — I10 Essential (primary) hypertension: Secondary | ICD-10-CM | POA: Diagnosis not present

## 2017-05-05 NOTE — BH Specialist Note (Signed)
Integrated Behavioral Health Initial Visit  MRN: [id number] Name: Emma Turner   Session Start time: 3:30 PM Session End time: 4:00 PM Total time: 30 minutes  Type of Service: [place] Interpretor:No. Interpretor Name and Language: N/A   Warm Hand Off Completed.       SUBJECTIVE: Kalinda Romaniello is a 51 y.o. female accompanied by patient. Patient was referred by Dr. Jarold Song for depression and anxiety. Patient reports the following symptoms/concerns: overwhelming feelings of sadness and worry, financial strain, homelessness, and employment stress Duration of problem: One month; Severity of problem: severe  OBJECTIVE: Mood: Anxious and Depressed and Affect: Depressed and Tearful Risk of harm to self or others: No plan to harm self or others   LIFE CONTEXT: Family and Social: Pt has an adult daughter who resides in [place]. Pt has not disclosed stressors to daughter and will not stay with her due to not liking daughter's boyfriend School/Work: Pt is employed as a Administrator. She is currently unable to return to work for another month due to a recent appeal on sleep study Self-Care: Pt is open to medication management to assist with anxiety Life Changes: Pt was has been hospitalized on multiple occassions within one month. She is currently unable to work and has no where to reside  GOALS ADDRESSED: Patient will reduce symptoms of: anxiety and depression and increase knowledge and/or ability of: coping skills and also: Increase adequate support systems for patient/family, Increase motivation to adhere to plan of care and Improve medication compliance   INTERVENTIONS: Solution-Focused Strategies, Supportive Counseling and Link to [employer]  Standardized Assessments completed: PHQ 2&9  ASSESSMENT: Patient currently experiencing anxiety triggered by declined health which resulted in her inability to return to work. She is currently homeless and  has no local support system. Patient may benefit from psychotherapy and medication management. Patient shared that she was denied STD and is unable to return to work for another month due to a recent appeal on sleep study. She is participating in medication management through PCP. LCSWA discussed benefits of initiating psychotherapy and medication management with [facility]; however, pt is not interested. LCSWA provided encouragement and community resources.  PLAN: 1. Follow up with behavioral health clinician on : Pt was encouraged tocontact LCSWA if symptoms worsen or fail to improveto schedule behavioral appointments at [facility]. 2. Behavioral recommendations: LCSWA recommends that pt apply healthy coping skills discussed, comply with medication management, and utilize provided resources. Pt is encouraged to schedule follow up appointment with LCSWA 3. Referral(s): [place] (LME/Outside Clinic) 4. "From scale of 1-10, how likely are you to follow plan?": 6/10  Rebekah Chesterfield, LCSW 05/06/17 4:58 PM

## 2017-05-05 NOTE — Progress Notes (Signed)
Patient Triage Assessment Form Todays Date: 05/05/2017 Name: Emma Turner DOB: [birth year] Reason for walkin:  Can't sleep, stress, anxiety, chest tightness When did your symptoms start? 3 days Please list symptoms: " legs are shaky"  Are you having pain: denies pain.  "Unable to sleep"  Assessment : Pt alert and oriented. NAD. Explains a lot of stressors r/t job and health. She is tearful. Denies SI/HI. She states she is homeless, she has been staying at a place in [place], called [employer]. Social Worker, Clearence Ped spoke to patient about resources.   BLE, pitting +2 EKG- NSR C/o chest tightness, "does not feel like I can catch my breath"    Vital Signs:  T: 98.7 P: 80  R: 20  SpO2: 96  BP: 156/98  Plan EKG MD aware Given cardiac history, pt recommended to go to ED. Pt has previous scheduled appointment scheduled with Cardiology and PCP on tomorrow: 05/06/2017.

## 2017-05-06 ENCOUNTER — Encounter: Payer: Self-pay | Admitting: Cardiology

## 2017-05-06 ENCOUNTER — Ambulatory Visit (INDEPENDENT_AMBULATORY_CARE_PROVIDER_SITE_OTHER): Payer: 59 | Admitting: Cardiology

## 2017-05-06 ENCOUNTER — Encounter: Payer: Self-pay | Admitting: Family Medicine

## 2017-05-06 ENCOUNTER — Ambulatory Visit: Payer: 59 | Attending: Family Medicine | Admitting: Family Medicine

## 2017-05-06 VITALS — BP 142/80 | HR 87 | Ht 64.0 in | Wt 230.0 lb

## 2017-05-06 VITALS — BP 133/85 | HR 80 | Temp 97.9°F | Resp 16 | Wt 229.0 lb

## 2017-05-06 DIAGNOSIS — R202 Paresthesia of skin: Secondary | ICD-10-CM

## 2017-05-06 DIAGNOSIS — F419 Anxiety disorder, unspecified: Secondary | ICD-10-CM

## 2017-05-06 DIAGNOSIS — G4709 Other insomnia: Secondary | ICD-10-CM

## 2017-05-06 DIAGNOSIS — I25118 Atherosclerotic heart disease of native coronary artery with other forms of angina pectoris: Secondary | ICD-10-CM | POA: Diagnosis not present

## 2017-05-06 DIAGNOSIS — R600 Localized edema: Secondary | ICD-10-CM | POA: Diagnosis not present

## 2017-05-06 DIAGNOSIS — I2510 Atherosclerotic heart disease of native coronary artery without angina pectoris: Secondary | ICD-10-CM | POA: Diagnosis not present

## 2017-05-06 DIAGNOSIS — Z9861 Coronary angioplasty status: Secondary | ICD-10-CM | POA: Diagnosis not present

## 2017-05-06 DIAGNOSIS — I10 Essential (primary) hypertension: Secondary | ICD-10-CM | POA: Diagnosis not present

## 2017-05-06 DIAGNOSIS — F3170 Bipolar disorder, currently in remission, most recent episode unspecified: Secondary | ICD-10-CM | POA: Diagnosis not present

## 2017-05-06 MED ORDER — FUROSEMIDE 40 MG PO TABS: 40.0000 mg | ORAL_TABLET | Freq: Every day | ORAL | 3 refills | Status: DC

## 2017-05-06 MED ORDER — HYDROXYZINE HCL 25 MG PO TABS: 25.0000 mg | ORAL_TABLET | Freq: Three times a day (TID) | ORAL | 1 refills | Status: DC | PRN

## 2017-05-06 MED FILL — ?HYDROXYZINE HCL 25 MG TAB: 25 MG | 3 days supply | Qty: 90 | Fill #0

## 2017-05-06 MED FILL — ?FUROSEMIDE 40 MG TABLET: 40 | 30 days supply | Qty: 30 | Fill #0

## 2017-05-06 NOTE — Patient Instructions (Addendum)
Medication Instructions:  Your physician recommends that you continue on your current medications as directed. Please refer to the Current Medication list given to you today.  Follow-Up: Your physician recommends that you schedule a follow-up appointment in: 3 MONTHS (or 1st available after 3 months, not APP) with Dr. Radford Pax.    Any Other Special Instructions Will Be Listed Below (If Applicable).  Please talk with social worker at [facility] and/or pharmacy to discuss help with medications.   [phone]   If you need a refill on your cardiac medications before your next appointment, please call your pharmacy.

## 2017-05-06 NOTE — Progress Notes (Signed)
Subjective:    Patient ID: Emma Turner, female    DOB: Mar 24, 1966, 51 y.o.   MRN: [id number]  HPI She is a 51 year old female with a history of coronary artery disease (status post DES to the LAD on 03/2017 and currently on aspirin and Plavix), hypertension, bipolar disorder, posttraumatic stress disorder, suspected sleep apnea, several ED visits for chest pain who presents today for follow-up visit.  She had presented to the clinic yesterday for a nurse visit with complaints of worsening anxiety, insomnia and chest tightness; EKG performed reveals normal sinus rhythm. She was seen by the licensed clinical social worker and underwent psychotherapy as well as received community resources.  At her visit with cardiology today metoprolol was added to her regimen.  She informs me that today she feels better than she did yesterday. She admits to anxiety and insomnia which stem from her unemployment, homelessness and financial difficulties that she is unable to afford food and medications. Denies suicidal ideation or intent She also complains of pedal edema which she describes as  "her legs feel like they will get up and walk away". She has tingling in the bottom of her feet with associated pins and needles which she is attributing to pedal edema.  Past Medical History:  Diagnosis Date  . Bipolar 1 disorder ([place])   . Chest pain 04/12/2017  . Coronary artery disease 04/08/2017   A.Cath 04/08/17: DES to mid LAD, 50% stenosis of RCA  . Depression   . Hypertension   . Sleep apnea   . Unstable angina [facility])     Past Surgical History:  Procedure Laterality Date  . CORONARY STENT INTERVENTION Right 04/08/2017   Procedure: Coronary Stent Intervention;  Surgeon: Sherren Mocha, MD;  Location: [place] [facility];  Service: Cardiovascular;  Laterality: Right;  . ECTOPIC PREGNANCY SURGERY    . FOOT SURGERY    . INTRAVASCULAR PRESSURE WIRE/FFR STUDY Right 04/08/2017   Procedure: Intravascular Pressure  Wire/FFR Study;  Surgeon: Sherren Mocha, MD;  Location: [place] [facility];  Service: Cardiovascular;  Laterality: Right;  . LEFT HEART CATH AND CORONARY ANGIOGRAPHY N/A 04/08/2017   Procedure: Left Heart Cath and Coronary Angiography;  Surgeon: Sherren Mocha, MD;  Location: [place] [facility];  Service: Cardiovascular;  Laterality: N/A;    No Known Allergies   Review of Systems  Constitutional: Negative for activity change, appetite change and fatigue.  HENT: Negative for congestion, sinus pressure and sore throat.   Eyes: Negative for visual disturbance.  Respiratory: Negative for cough, chest tightness, shortness of breath and wheezing.   Cardiovascular: Positive for leg swelling. Negative for chest pain and palpitations.  Gastrointestinal: Negative for abdominal distention, abdominal pain and constipation.  Endocrine: Negative for polydipsia.  Genitourinary: Negative for dysuria and frequency.  Musculoskeletal: Negative for arthralgias and back pain.  Skin: Negative for rash.  Neurological: Positive for numbness. Negative for tremors and light-headedness.  Hematological: Does not bruise/bleed easily.  Psychiatric/Behavioral: Negative for agitation and behavioral problems. The patient is nervous/anxious.        Objective: Vitals:   05/06/17 1122  BP: 133/85  Pulse: 80  Resp: 16  Temp: 97.9 F (36.6 C)  TempSrc: Oral  SpO2: 97%  Weight: 229 lb (103.9 kg)      Physical Exam  Constitutional: She is oriented to person, place, and time. She appears well-developed and well-nourished.  Neck: No JVD present.  Cardiovascular: Normal rate, normal heart sounds and intact distal pulses.   No murmur heard.  Pulmonary/Chest: Effort normal and breath sounds normal. She has no wheezes. She has no rales. She exhibits no tenderness.  Abdominal: Soft. Bowel sounds are normal. She exhibits no distension and no mass. There is no tenderness.  Musculoskeletal: Normal range of motion. She  exhibits edema (1+ bilateral nonpitting pedal edema).  Neurological: She is alert and oriented to person, place, and time.  Skin: Skin is warm and dry.  Psychiatric:  Anxious          CMP Latest Ref Rng & Units 04/23/2017 04/20/2017 04/18/2017  Glucose 65 - 99 mg/dL 103(H) 145(H) 144(H)  BUN 6 - 24 mg/dL 12 11 14   Creatinine 0.57 - 1.00 mg/dL 0.83 0.87 0.80  Sodium 134 - 144 mmol/L 142 138 140  Potassium 3.5 - 5.2 mmol/L 4.2 3.3(L) 3.5  Chloride 96 - 106 mmol/L 104 104 106  CO2 18 - 29 mmol/L 21 26 23   Calcium 8.7 - 10.2 mg/dL 9.2 8.7(L) 9.1  Total Protein 6.0 - 8.3 g/dL - - -  Total Bilirubin 0.3 - 1.2 mg/dL - - -  Alkaline Phos 39 - 117 U/L - - -  AST 0 - 37 U/L - - -  ALT 0 - 35 U/L - - -     Assessment & Plan:  1. Essential hypertension Slightly elevated above goal of less than 130/80 Dose metoprolol added by cardiology  2. Coronary artery disease involving native coronary artery of native heart with other form of angina pectoris ([place]) Status post drug-eluting stent to LAD Risk factor modification - quit smoking Low-dose metoprolol added by cardiology today Continue dual antiplatelet therapy with Plavix and aspirin  3. Anxiety Worsening anxiety due to homelessness, unemployment I have placed her on hydroxyzine which will hopefully help with both anxiety and insomnia Seen by the LCSW and she has been provided with community resources We have provided her with cans of food from the pantry.  4. Pedal edema Switched from hydrochlorothiazide to Lasix Last BNP was 31.4 ,BNP today We'll check potassium level at next visit to determine if potassium supplement as needed  5. Suspected sleep apnea Upcoming appointment for sleep study  6. Paresthesia Could be secondary to worsening pedal edema If symptoms persist at next visit we will discuss initiation of gabapentin  7. Underlying bipolar disorder She was advised to present to [facility] for mental health care which she  declines due to fear of being stigmatized

## 2017-05-06 NOTE — Progress Notes (Signed)
05/06/2017 Emma Turner   13-Jan-1966  [id number]  Primary Physician Arnoldo Morale, MD Primary Cardiologist: Dr. Radford Pax    Reason for Visit/CC: F/u for CAD s/p recent NSTEMI w/ PCI to the LAD; HTN  HPI:  Emma Turner is a 51 y/o female with a h/o CAD, HTN, tobacco use, OSA and Bipolar 1 disorder, who presents back to clinic for f/u. She was recently admitted to [facility] on 03/19/17 with chest pain and ruled in for NSTEMI with troponin peaking at 0.08. Subsequent LHC was performed by Dr. Burt Knack. She was found to have single vessel CAD involving the mid LAD, 80% stenosis, treated successfully with DES. There was moderate mid RCA stenosis, treated medically. The left main and LCx were both widely patent. LVEF was normal at 65%. She was initially placed on DAPT with ASA + Brilinta, however did not tolerate Brilinta due to dyspnea. She had recurrent CP and presented back to the hospital on 04/13/17. She was admitted and cardiac enzymes were cycled but were negative, ruling out acute instent thrombosis. There were also no EKG changes. There were no indications for repeat [facility]. Her CP was felt atypical. She was switched from Brilinta to Plavix. ASA continued.   I evaluated her for post hospital f/u on 04/23/17. She denied CP but noted mild exertional dyspnea and new complaint of bilateral LEE, however pro BNP was normal at 76. Her diastolic BP was elevated in the 90s. She was instructed to continue HCTZ, elevate legs and avoid sodium. Beta blocker therapy with metoprolol was also added 12.5 mg BID given her CAD and HTN as well as Lipitor 40 mg (LDL during hospitalization was 87 mg/dL).   She presents back to clinic today for f/u. She has a lot of anxiety given her social situation. 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 She has no income at the moment. She lost her home and is sleeping in her car. She has been unable to afford all of her meds, but somehow finds a way to get cigarettes. She states  she has no family to help her. Per patient report, she has been fully compliant with ASA and Plavix and understands the importance of strict compliance to reduce risk of acute in-stent-thrombosis/ recurrent MI. She has not been compliant with metoprolol, Lipitor and Priolsec due to inability to afford medications. She has been taking her lisinopril and HCTZ.   She is currently CP free, but has been having panic attacks. She hyperventilates when she gets stressed out. She walked into [facility] yesterday for this. EKG was obtained which showed NSR. She states she has a scheduled f/u there at 11:15.   Current Meds  Medication Sig  . aspirin EC 81 MG tablet Take 1 tablet (81 mg total) by mouth daily.  . clopidogrel (PLAVIX) 75 MG tablet Take 1 tablet (75 mg total) by mouth daily.  . hydrochlorothiazide (HYDRODIURIL) 25 MG tablet Take 1 tablet (25 mg total) by mouth daily.  Marland Kitchen lisinopril (PRINIVIL,ZESTRIL) 5 MG tablet Take 1 tablet (5 mg total) by mouth daily.  Marland Kitchen omeprazole (PRILOSEC) 20 MG capsule Take 1 capsule (20 mg total) by mouth daily.   No Known Allergies Past Medical History:  Diagnosis Date  . Bipolar 1 disorder ([place])   . Chest pain 04/12/2017  . Coronary artery disease 04/08/2017   A.Cath 04/08/17: DES to mid LAD, 50% stenosis of RCA  . Depression   . Hypertension   . Sleep  apnea   . Unstable angina ([facility])    Family History  Problem Relation Age of Onset  . Kidney cancer Mother   . Heart attack Brother        In 14s  . Drug abuse Brother   . Stroke Father    Past Surgical History:  Procedure Laterality Date  . CORONARY STENT INTERVENTION Right 04/08/2017   Procedure: Coronary Stent Intervention;  Surgeon: Sherren Mocha, MD;  Location: [place] [facility];  Service: Cardiovascular;  Laterality: Right;  . ECTOPIC PREGNANCY SURGERY    . FOOT SURGERY    . INTRAVASCULAR PRESSURE WIRE/FFR STUDY Right 04/08/2017   Procedure: Intravascular Pressure Wire/FFR  Study;  Surgeon: Sherren Mocha, MD;  Location: [place] [facility];  Service: Cardiovascular;  Laterality: Right;  . LEFT HEART CATH AND CORONARY ANGIOGRAPHY N/A 04/08/2017   Procedure: Left Heart Cath and Coronary Angiography;  Surgeon: Sherren Mocha, MD;  Location: [place] [facility];  Service: Cardiovascular;  Laterality: N/A;   Social History   Social History  . Marital status: Divorced    Spouse name: N/A  . Number of children: N/A  . Years of education: N/A   Occupational History  . truck driver    Social History Main Topics  . Smoking status: Current Every Day Smoker    Packs/day: 2.00    Types: Cigarettes  . Smokeless tobacco: Never Used  . Alcohol use No  . Drug use: Yes    Types: Marijuana     Comment: Consuming daily; has not used in 1 month   . Sexual activity: Yes    Birth control/ protection: None   Other Topics Concern  . Not on file   Social History Narrative  . No narrative on file     Review of Systems: General: negative for chills, fever, night sweats or weight changes.  Cardiovascular: negative for chest pain, dyspnea on exertion, edema, orthopnea, palpitations, paroxysmal nocturnal dyspnea or shortness of breath Dermatological: negative for rash Respiratory: negative for cough or wheezing Urologic: negative for hematuria Abdominal: negative for nausea, vomiting, diarrhea, bright red blood per rectum, melena, or hematemesis Neurologic: negative for visual changes, syncope, or dizziness All other systems reviewed and are otherwise negative except as noted above.   Physical Exam:  Blood pressure (!) 142/80, pulse 87, height 5\' 4"  (1.626 m), weight 230 lb (104.3 kg), SpO2 97 %.  General appearance: alert, cooperative and no distress Neck: no carotid bruit and no JVD Lungs: clear to auscultation bilaterally Heart: regular rate and rhythm, S1, S2 normal, no murmur, click, rub or gallop Extremities: trace bilateral pedal edema Pulses: trace pedal  edema Skin: Skin color, texture, turgor normal. No rashes or lesions Neurologic: Grossly normal  EKG not performed -- personally reviewed   ASSESSMENT AND PLAN:   1. CAD: s/p recent NSTEMI 04/08/17. Troponin peaked at 0.08. [facility] showed severe single vessel CAD with 80% mid LAD treated with PCI + DES with mild residual nonobstructive RCA disease, treated medically. LM and Lcx widely patent. She did not tolerate Brilinta due to dyspnea. Has been switched to Plavix + ASA. Doing better w/ Plavix. No residual dyspnea. No ischemic CP. Pt has had issues with medication compliance due to inability to afford meds. She has been fully compliant with ASA and Plavix and understands the importance of strict compliance to reduce risk of acute in-stent-thrombosis/ recurrent MI. She has not been compliant with metoprolol, Lipitor and Priolsec due to inability to afford medications. She has been  taking her lisinopril and HCTZ. We had a long discussion regarding the importance of medication compliance for secondary prevention and the need to stop smoking. She has an appt at [facility] later today for her anxiety. I've advised that she discuss with them medication assistance for her cardiac meds through the [facility] pharmacy.   2. HTN: mildly elevated. Pt not compliant with metoprolol. She has not picked up Rx given financial issues. Pt is going to [facility] today for f/u for her anxiety. She was encouraged to seek medication assistance through them.   3. Tobacco Use: smoking cessation strongly advised.   4. HLD: Pt not currently on a statin. LDL was 87 during recent admission. Hepatic function normal. I added Lipitor 40 mg at last OV, however patient noncompliant given cost/ financial issues.  5. ? OSA: sleep study pending.   6. Bilateral LEE: trace pedal edema noted on exam. Recent echo showed normal LVEF. Pro - BNP was normal. Continue HCTZ. Elevate legs when able.   Follow-Up: keep f/u with Dr. Radford Pax in September.    Emma Turner, MHS [facility] 05/06/2017 8:40 AM

## 2017-05-07 ENCOUNTER — Telehealth: Payer: Self-pay | Admitting: Licensed Clinical Social Worker

## 2017-05-07 LAB — BRAIN NATRIURETIC PEPTIDE: BNP: 15.8 pg/mL (ref 0.0–100.0)

## 2017-05-07 NOTE — Telephone Encounter (Signed)
LCSWA received an incoming call from pt.   Pt disclosed difficulty sleeping. She hasn't picked up her prescribed medication and is currently sleeping in her car. LCSWA provided pt with resources for emergency housing and strongly encouraged pt to contact [employer].   Pt reported swelling in her feet and chest pain. She states she will go to ED to complete a medical evaluation. LCSWA provided support and encouragement. Pt was advised to contact LCSWA and/or PCP for additional resources.

## 2017-05-09 ENCOUNTER — Emergency Department (HOSPITAL_COMMUNITY): Payer: 59

## 2017-05-09 ENCOUNTER — Emergency Department (HOSPITAL_COMMUNITY)
Admission: EM | Admit: 2017-05-09 | Discharge: 2017-05-09 | Disposition: A | Discharge status: Home / Self Care | Payer: 59 | Attending: Emergency Medicine | Admitting: Emergency Medicine

## 2017-05-09 ENCOUNTER — Encounter (HOSPITAL_COMMUNITY): Payer: Self-pay | Admitting: Emergency Medicine

## 2017-05-09 DIAGNOSIS — Z79899 Other long term (current) drug therapy: Secondary | ICD-10-CM | POA: Insufficient documentation

## 2017-05-09 DIAGNOSIS — Z955 Presence of coronary angioplasty implant and graft: Secondary | ICD-10-CM | POA: Insufficient documentation

## 2017-05-09 DIAGNOSIS — I2510 Atherosclerotic heart disease of native coronary artery without angina pectoris: Secondary | ICD-10-CM | POA: Insufficient documentation

## 2017-05-09 DIAGNOSIS — Z7902 Long term (current) use of antithrombotics/antiplatelets: Secondary | ICD-10-CM | POA: Insufficient documentation

## 2017-05-09 DIAGNOSIS — Z7982 Long term (current) use of aspirin: Secondary | ICD-10-CM | POA: Insufficient documentation

## 2017-05-09 DIAGNOSIS — M7989 Other specified soft tissue disorders: Secondary | ICD-10-CM

## 2017-05-09 DIAGNOSIS — R600 Localized edema: Secondary | ICD-10-CM

## 2017-05-09 DIAGNOSIS — R0789 Other chest pain: Secondary | ICD-10-CM | POA: Insufficient documentation

## 2017-05-09 DIAGNOSIS — I10 Essential (primary) hypertension: Secondary | ICD-10-CM | POA: Insufficient documentation

## 2017-05-09 DIAGNOSIS — F17210 Nicotine dependence, cigarettes, uncomplicated: Secondary | ICD-10-CM | POA: Insufficient documentation

## 2017-05-09 LAB — CBC WITH DIFFERENTIAL/PLATELET
Basophils Absolute: 0 10*3/uL (ref 0.0–0.1)
Basophils Relative: 0 %
Eosinophils Absolute: 0.2 10*3/uL (ref 0.0–0.7)
Eosinophils Relative: 3 %
HEMATOCRIT: 33 % — AB (ref 36.0–46.0)
Hemoglobin: 9.8 g/dL — ABNORMAL LOW (ref 12.0–15.0)
LYMPHS ABS: 1.7 10*3/uL (ref 0.7–4.0)
Lymphocytes Relative: 22 %
MCH: 21.8 pg — ABNORMAL LOW (ref 26.0–34.0)
MCHC: 29.7 g/dL — ABNORMAL LOW (ref 30.0–36.0)
MCV: 73.3 fL — AB (ref 78.0–100.0)
Monocytes Absolute: 0.4 10*3/uL (ref 0.1–1.0)
Monocytes Relative: 6 %
NEUTROS PCT: 69 %
Neutro Abs: 5.3 10*3/uL (ref 1.7–7.7)
PLATELETS: 279 10*3/uL (ref 150–400)
RBC: 4.5 MIL/uL (ref 3.87–5.11)
RDW: 19.5 % — ABNORMAL HIGH (ref 11.5–15.5)
WBC: 7.6 10*3/uL (ref 4.0–10.5)

## 2017-05-09 LAB — COMPREHENSIVE METABOLIC PANEL
ALT: 16 U/L (ref 14–54)
ANION GAP: 8 (ref 5–15)
AST: 23 U/L (ref 15–41)
Albumin: 3.5 g/dL (ref 3.5–5.0)
Alkaline Phosphatase: 91 U/L (ref 38–126)
BUN: 13 mg/dL (ref 6–20)
CHLORIDE: 106 mmol/L (ref 101–111)
CO2: 25 mmol/L (ref 22–32)
Calcium: 8.8 mg/dL — ABNORMAL LOW (ref 8.9–10.3)
Creatinine, Ser: 0.86 mg/dL (ref 0.44–1.00)
Glucose, Bld: 119 mg/dL — ABNORMAL HIGH (ref 65–99)
POTASSIUM: 3.7 mmol/L (ref 3.5–5.1)
Sodium: 139 mmol/L (ref 135–145)
Total Bilirubin: 0.4 mg/dL (ref 0.3–1.2)
Total Protein: 7.1 g/dL (ref 6.5–8.1)

## 2017-05-09 LAB — POCT I-STAT TROPONIN I
TROPONIN I, POC: 0 ng/mL (ref 0.00–0.08)
Troponin i, poc: 0 ng/mL (ref 0.00–0.08)

## 2017-05-09 MED ORDER — FUROSEMIDE 40 MG PO TABS
40.0000 mg | ORAL_TABLET | Freq: Once | ORAL | Status: AC
  Administered 2017-05-09: 40 mg via ORAL
  Filled 2017-05-09: qty 1

## 2017-05-09 MED ORDER — CLOPIDOGREL BISULFATE 75 MG PO TABS
75.0000 mg | ORAL_TABLET | Freq: Once | ORAL | Status: AC
  Administered 2017-05-09: 75 mg via ORAL
  Filled 2017-05-09: qty 1

## 2017-05-09 MED ORDER — LORAZEPAM 1 MG PO TABS
1.0000 mg | ORAL_TABLET | Freq: Once | ORAL | Status: AC
  Administered 2017-05-09: 1 mg via ORAL
  Filled 2017-05-09: qty 1

## 2017-05-09 MED ORDER — FUROSEMIDE 20 MG PO TABS: 20.0000 mg | ORAL_TABLET | Freq: Every day | ORAL | 0 refills | Status: DC

## 2017-05-09 MED ORDER — GI COCKTAIL ~~LOC~~
30.0000 mL | Freq: Once | ORAL | Status: AC
  Administered 2017-05-09: 30 mL via ORAL
  Filled 2017-05-09: qty 30

## 2017-05-09 NOTE — ED Notes (Signed)
Dr. Nanavati at bedside 

## 2017-05-09 NOTE — ED Triage Notes (Signed)
Patient complaining of chest pain. Patient had a stent placed and has 50% EF. Patient states that her left side is having tingling from ear to her arm. Patient also states she has tightness in her chest.

## 2017-05-09 NOTE — ED Notes (Signed)
Dr. Kathrynn Humble in to evaluate patient's ankles and feet for swelling per patient request

## 2017-05-09 NOTE — ED Notes (Signed)
Attempted to place second IV, but upon placement patient stated it hurt too bad and wanted it removed.

## 2017-05-09 NOTE — ED Provider Notes (Signed)
[place] DEPT Provider Note   CSN: [id number] Arrival date & time: 05/09/17  0219  By signing my name below, I, Emma Turner, attest that this documentation has been prepared under the direction and in the presence of Varney Biles, MD . Electronically Signed: Levester Turner, Scribe. 05/09/2017. 4:20 AM.   History   Chief Complaint Chief Complaint  Patient presents with  . Chest Pain   HPI Comments Emma Turner is a 51 y.o. female with a PMHx significant for bipolar disorder, depression, sleep apnea, chronic iron deficiency anemia, HTN, CAD s/p stent placementmid LAD for severe single vessel coronary artery disease on 5/24/201  following NSTEMI, who presents to the Emergency Department with complaints of intermittent chest pain x1 day.  Radiation to left neck.  Associated dyspnea and chest tightness over the same time frame.  Sx currently rated a 8/10 in severity.  Pt reports that she has been under increased stress due to work.  She feels that this pain is different from her previous MI.   Pt denies experiencing any other acute sx, including nausea, vomiting, fever or chills.   The history is provided by the patient and medical records. No language interpreter was used.    Past Medical History:  Diagnosis Date  . Bipolar 1 disorder ([place])   . Chest pain 04/12/2017  . Coronary artery disease 04/08/2017   A.Cath 04/08/17: DES to mid LAD, 50% stenosis of RCA  . Depression   . Hypertension   . Sleep apnea   . Unstable angina [facility])     Patient Active Problem List   Diagnosis Date Noted  . Pedal edema 04/21/2017  . Depression 04/12/2017  . Bipolar disorder ([place]) 04/12/2017  . OSA (obstructive sleep apnea) 04/12/2017  . Chest pain 04/08/2017  . Essential hypertension 04/08/2017  . Iron deficiency anemia 04/08/2017  . Hypokalemia 04/08/2017  . Unstable angina pectoris ([place]) 04/08/2017  . Coronary artery disease 04/08/2017  . Tobacco abuse   . Unstable angina ([place])     . PTSD (post-traumatic stress disorder) 06/17/2013  . Legal circumstances 06/17/2013  . Homeless 06/17/2013    Past Surgical History:  Procedure Laterality Date  . CORONARY STENT INTERVENTION Right 04/08/2017   Procedure: Coronary Stent Intervention;  Surgeon: Sherren Mocha, MD;  Location: [place] [facility];  Service: Cardiovascular;  Laterality: Right;  . ECTOPIC PREGNANCY SURGERY    . FOOT SURGERY    . INTRAVASCULAR PRESSURE WIRE/FFR STUDY Right 04/08/2017   Procedure: Intravascular Pressure Wire/FFR Study;  Surgeon: Sherren Mocha, MD;  Location: [place] [facility];  Service: Cardiovascular;  Laterality: Right;  . LEFT HEART CATH AND CORONARY ANGIOGRAPHY N/A 04/08/2017   Procedure: Left Heart Cath and Coronary Angiography;  Surgeon: Sherren Mocha, MD;  Location: [place] [facility];  Service: Cardiovascular;  Laterality: N/A;    OB History    No data available       Home Medications    Prior to Admission medications   Medication Sig Start Date End Date Taking? Authorizing Provider  aspirin EC 81 MG tablet Take 1 tablet (81 mg total) by mouth daily. 04/09/17 04/09/18 Yes Rosita Fire, MD  clopidogrel (PLAVIX) 75 MG tablet Take 1 tablet (75 mg total) by mouth daily. 04/18/17  Yes Carmin Muskrat, MD  ibuprofen (ADVIL,MOTRIN) 200 MG tablet Take 400 mg by mouth every 6 (six) hours as needed for moderate pain.   Yes [provider]  ferrous sulfate 325 (65 FE) MG tablet Take 1 tablet (  325 mg total) by mouth daily. 05/15/17   Recardo Evangelist, PA-C  furosemide (LASIX) 20 MG tablet Take 1 tablet (20 mg total) by mouth daily. 05/09/17   Drenda Freeze, MD  hydrOXYzine (ATARAX/VISTARIL) 25 MG tablet Take 1 tablet (25 mg total) by mouth 3 (three) times daily as needed. 05/06/17   Arnoldo Morale, MD  lisinopril (PRINIVIL,ZESTRIL) 5 MG tablet Take 1 tablet (5 mg total) by mouth daily. 05/14/17 05/14/18  Arnoldo Morale, MD  megestrol (MEGACE) 40 MG tablet Take 1 tablet (40  mg total) by mouth 2 (two) times daily. 05/15/17   Recardo Evangelist, PA-C    Family History Family History  Problem Relation Age of Onset  . Kidney cancer Mother   . Heart attack Brother        In 28s  . Drug abuse Brother   . Stroke Father     Social History Social History  Substance Use Topics  . Smoking status: Current Every Day Smoker    Packs/day: 2.00    Types: Cigarettes  . Smokeless tobacco: Never Used  . Alcohol use No     Allergies   Patient has no known allergies.   Review of Systems Review of Systems  Constitutional: Negative for chills and fever.  Respiratory: Positive for chest tightness and shortness of breath.   Cardiovascular: Positive for chest pain.  Gastrointestinal: Negative for nausea and vomiting.  Musculoskeletal: Negative for arthralgias and myalgias.  Neurological: Negative for weakness and numbness.  All other systems reviewed and are negative.   Physical Exam Updated Vital Signs BP 102/61   Pulse 63   Temp 98.2 F (36.8 C) (Oral)   Resp 18   Ht 5\' 4"  (1.626 m)   Wt 103.9 kg (229 lb)   LMP  (LMP Unknown) Comment: h/o fibroids  SpO2 93%   BMI 39.31 kg/m   Physical Exam  Constitutional: She is oriented to person, place, and time. She appears well-developed and well-nourished. No distress.  HENT:  Head: Normocephalic and atraumatic.  Eyes: EOM are normal.  Neck: Normal range of motion.  Cardiovascular: Normal rate, regular rhythm and normal heart sounds.   Pulmonary/Chest: Effort normal and breath sounds normal.  Abdominal: Soft. There is no tenderness.  Musculoskeletal: Normal range of motion.  Neurological: She is alert and oriented to person, place, and time.  Skin: Skin is warm and dry.  Psychiatric: She has a normal mood and affect.  Nursing note and vitals reviewed.  ED Treatments / Results  DIAGNOSTIC STUDIES: Oxygen Saturation is 99% on room air, normal by my interpretation.    COORDINATION OF CARE: 3:44 AM  Discussed treatment plan with pt at bedside and pt agreed to plan.  Labs (all labs ordered are listed, but only abnormal results are displayed) Labs Reviewed  COMPREHENSIVE METABOLIC PANEL - Abnormal; Notable for the following:       Result Value   Glucose, Bld 119 (*)    Calcium 8.8 (*)    All other components within normal limits  CBC WITH DIFFERENTIAL/PLATELET - Abnormal; Notable for the following:    Hemoglobin 9.8 (*)    HCT 33.0 (*)    MCV 73.3 (*)    MCH 21.8 (*)    MCHC 29.7 (*)    RDW 19.5 (*)    All other components within normal limits  I-STAT TROPOININ, ED  POCT I-STAT TROPONIN I  I-STAT TROPOININ, ED  POCT I-STAT TROPONIN I    EKG  EKG Interpretation  Date/Time:  Sunday May 09 2017 02:33:00 EDT Ventricular Rate:  80 PR Interval:    QRS Duration: 94 QT Interval:  373 QTC Calculation: 431 R Axis:   39 Text Interpretation:  Sinus rhythm Low voltage, precordial leads Consider anterior infarct No acute changes No significant change since last tracing Confirmed by Varney Biles [phone]) on 05/09/2017 3:40:53 AM       Radiology No results found.  Procedures Procedures (including critical care time)  Medications Ordered in ED Medications  clopidogrel (PLAVIX) tablet 75 mg (75 mg Oral Given 05/09/17 0439)  gi cocktail (Maalox,Lidocaine,Donnatal) (30 mLs Oral Given 05/09/17 0440)  LORazepam (ATIVAN) tablet 1 mg (1 mg Oral Given 05/09/17 0439)  furosemide (LASIX) tablet 40 mg (40 mg Oral Given 05/09/17 0905)     Initial Impression / Assessment and Plan / ED Course  I have reviewed the triage vital signs and the nursing notes.  Pertinent labs & imaging results that were available during my care of the patient were reviewed by me and considered in my medical decision making (see chart for details).     I personally performed the services described in this documentation, which was scribed in my presence. The recorded information has been reviewed and is  accurate.  Pt comes in with cc of chest pain. Pt has hx of CAD, and recurrent L sided chest pain.  She had a STEMI in May, and has had admission for chest pain after that visit that didn't yield any findings of ischemia. Pt has hx of anxiety, and her pain appears to correlate when she is anxious. EKG is reassuring. Plan is to monitor and get trops x 2 - if neg, we will d/c with close cards f/u.  Reassessment: Results from the ER workup discussed with the patient face to face and all questions answered to the best of my ability.  Strict ER return precautions have been discussed, and patient is agreeing with the plan and is comfortable with the workup done and the recommendations from the ER.   Final Clinical Impressions(s) / ED Diagnoses   Final diagnoses:  Other chest pain  Leg swelling    New Prescriptions Discharge Medication List as of 05/09/2017  8:30 AM    START taking these medications   Details  !! furosemide (LASIX) 20 MG tablet Take 1 tablet (20 mg total) by mouth daily., Starting Sun 05/09/2017, Print     !! - Potential duplicate medications found. Please discuss with provider.       Varney Biles, MD 05/18/17 [phone]

## 2017-05-09 NOTE — ED Notes (Signed)
Patient asleep-MP SR with no ectopy-patient continues to take BP cuff off arm.

## 2017-05-09 NOTE — Discharge Instructions (Signed)
Continue your current meds.   Add lasix 20 mg in the evening for 5 days to help with leg swelling.   Call cardiology tomorrow for appointment this week   Return to ER if you have worse chest pain, trouble breathing, worse leg swelling, fevers.

## 2017-05-09 NOTE — ED Notes (Signed)
Sleeping-no distress. MP SR with no ectopy

## 2017-05-09 NOTE — ED Notes (Signed)
Patient demanded IV be removed because "it stings". Saline lock removed and patient declined for this writer to place a new IV

## 2017-05-09 NOTE — ED Provider Notes (Signed)
  Physical Exam  BP 119/72 (BP Location: Right Arm)   Pulse 70   Temp 98.2 F (36.8 C) (Oral)   Resp 18   Ht 5\' 4"  (1.626 m)   Wt 103.9 kg (229 lb)   LMP  (LMP Unknown) Comment: h/o fibroids  SpO2 96%   BMI 39.31 kg/m   Physical Exam  ED Course  Procedures  MDM Patient care assumed at 7:30 am. Patient has recent NSTEMI here with chest pain, leg swelling last night. She was recently admitted since NSTEMI and cardiology changed brilinta to plavix and added imdur. Had another visit 2 weeks ago and CT angio chest unremarkable. Sign out pending second trop.   8:27 AM Second trop neg. Complained of bilateral lower extremity pain. On exam, 1+ edema bilateral legs, no calf tenderness. CXR showed some congestion. Appears mildly volume overloaded. She is on lasix 40 mg daily. Will give extra 40 mg today. Will add 20 mg in the evening for diuresis. Told her to call cardiology office tomorrow for appointment this week.        Drenda Freeze, MD 05/09/17 [phone]

## 2017-05-11 MED FILL — POTASSIUM CL ER 20 MEQ TAB: 20 | 30 days supply | Qty: 30 | Fill #0

## 2017-05-11 MED FILL — ?METOPROLOL 25 MG TABLET: 25 | 90 days supply | Qty: 30 | Fill #0

## 2017-05-11 MED FILL — ATORVASTATIN 40 MG TABLET: 40 | 30 days supply | Qty: 30 | Fill #0

## 2017-05-11 MED FILL — HYDROCHLOROTHIAZIDE 25 MG T: 25 | 30 days supply | Qty: 30 | Fill #0

## 2017-05-13 ENCOUNTER — Telehealth: Payer: Self-pay | Admitting: *Deleted

## 2017-05-13 NOTE — Telephone Encounter (Signed)
-----   Message from Argentina Donovan, [place] sent at 04/21/2017  3:08 PM EDT ----- Please call patient.  Her hemoglobin is a little low but improving since hospitalization.  Advise healthy diet and adequate water intake and we will recheck this in a few weeks.    Thanks, Freeman Caldron, PA-C

## 2017-05-13 NOTE — Telephone Encounter (Signed)
Patient verified DOB Patient is aware of hemoglobin being low but improved from the hospital. Patient states she has been changing her pad every hour for the past 3 days. Patient has appointment on 05/21/17 with Dr. Jarold Song.

## 2017-05-14 ENCOUNTER — Telehealth: Payer: Self-pay | Admitting: Cardiology

## 2017-05-14 MED ORDER — LISINOPRIL 5 MG PO TABS: 5.0000 mg | ORAL_TABLET | Freq: Every day | ORAL | 3 refills | Status: DC

## 2017-05-14 MED FILL — LISINOPRIL 5 MG TAB: 5 | 30 days supply | Qty: 30 | Fill #0

## 2017-05-14 NOTE — Telephone Encounter (Signed)
Refilled

## 2017-05-14 NOTE — Telephone Encounter (Signed)
New message     Pt employer need the status of her health before she can come back to work , they want to know when the restrictions will be lifted .

## 2017-05-15 ENCOUNTER — Encounter (HOSPITAL_COMMUNITY): Payer: Self-pay | Admitting: Emergency Medicine

## 2017-05-15 ENCOUNTER — Other Ambulatory Visit: Payer: Self-pay

## 2017-05-15 ENCOUNTER — Emergency Department (HOSPITAL_COMMUNITY)
Admission: EM | Admit: 2017-05-15 | Discharge: 2017-05-15 | Disposition: A | Discharge status: Home / Self Care | Payer: 59 | Attending: Emergency Medicine | Admitting: Emergency Medicine

## 2017-05-15 DIAGNOSIS — N939 Abnormal uterine and vaginal bleeding, unspecified: Secondary | ICD-10-CM

## 2017-05-15 DIAGNOSIS — Z7982 Long term (current) use of aspirin: Secondary | ICD-10-CM | POA: Insufficient documentation

## 2017-05-15 DIAGNOSIS — F17210 Nicotine dependence, cigarettes, uncomplicated: Secondary | ICD-10-CM | POA: Insufficient documentation

## 2017-05-15 DIAGNOSIS — R0789 Other chest pain: Secondary | ICD-10-CM | POA: Insufficient documentation

## 2017-05-15 DIAGNOSIS — I2510 Atherosclerotic heart disease of native coronary artery without angina pectoris: Secondary | ICD-10-CM | POA: Insufficient documentation

## 2017-05-15 DIAGNOSIS — D500 Iron deficiency anemia secondary to blood loss (chronic): Secondary | ICD-10-CM

## 2017-05-15 DIAGNOSIS — I10 Essential (primary) hypertension: Secondary | ICD-10-CM | POA: Insufficient documentation

## 2017-05-15 DIAGNOSIS — Z79899 Other long term (current) drug therapy: Secondary | ICD-10-CM | POA: Insufficient documentation

## 2017-05-15 DIAGNOSIS — Z7902 Long term (current) use of antithrombotics/antiplatelets: Secondary | ICD-10-CM | POA: Insufficient documentation

## 2017-05-15 LAB — COMPREHENSIVE METABOLIC PANEL
ALK PHOS: 91 U/L (ref 38–126)
ALT: 18 U/L (ref 14–54)
AST: 23 U/L (ref 15–41)
Albumin: 3.4 g/dL — ABNORMAL LOW (ref 3.5–5.0)
Anion gap: 8 (ref 5–15)
BILIRUBIN TOTAL: 0.4 mg/dL (ref 0.3–1.2)
BUN: 14 mg/dL (ref 6–20)
CALCIUM: 8.6 mg/dL — AB (ref 8.9–10.3)
CO2: 24 mmol/L (ref 22–32)
CREATININE: 0.98 mg/dL (ref 0.44–1.00)
Chloride: 105 mmol/L (ref 101–111)
Glucose, Bld: 130 mg/dL — ABNORMAL HIGH (ref 65–99)
Potassium: 3.5 mmol/L (ref 3.5–5.1)
Sodium: 137 mmol/L (ref 135–145)
Total Protein: 6.7 g/dL (ref 6.5–8.1)

## 2017-05-15 LAB — URINALYSIS, ROUTINE W REFLEX MICROSCOPIC
BILIRUBIN URINE: NEGATIVE
Bacteria, UA: NONE SEEN
GLUCOSE, UA: NEGATIVE mg/dL
KETONES UR: NEGATIVE mg/dL
LEUKOCYTES UA: NEGATIVE
Nitrite: NEGATIVE
PH: 5 (ref 5.0–8.0)
Protein, ur: NEGATIVE mg/dL
SPECIFIC GRAVITY, URINE: 1.028 (ref 1.005–1.030)

## 2017-05-15 LAB — TROPONIN I: Troponin I: <0.03 ng/mL (ref <0.03)

## 2017-05-15 LAB — WET PREP, GENITAL
Clue Cells Wet Prep HPF POC: NONE SEEN
Sperm: NONE SEEN
Trich, Wet Prep: NONE SEEN
Yeast Wet Prep HPF POC: NONE SEEN

## 2017-05-15 LAB — LIPASE, BLOOD: Lipase: 39 U/L (ref 11–51)

## 2017-05-15 LAB — CBC
HCT: 28.4 % — ABNORMAL LOW (ref 36.0–46.0)
Hemoglobin: 8.4 g/dL — ABNORMAL LOW (ref 12.0–15.0)
MCH: 22.2 pg — ABNORMAL LOW (ref 26.0–34.0)
MCHC: 29.6 g/dL — ABNORMAL LOW (ref 30.0–36.0)
MCV: 74.9 fL — ABNORMAL LOW (ref 78.0–100.0)
PLATELETS: 311 10*3/uL (ref 150–400)
RBC: 3.79 MIL/uL — ABNORMAL LOW (ref 3.87–5.11)
RDW: 20 % — AB (ref 11.5–15.5)
WBC: 6.9 10*3/uL (ref 4.0–10.5)

## 2017-05-15 LAB — HCG, QUANTITATIVE, PREGNANCY: hCG, Beta Chain, Quant, S: 1 m[IU]/mL (ref <5)

## 2017-05-15 MED ORDER — MEGESTROL ACETATE 40 MG PO TABS
40.0000 mg | ORAL_TABLET | Freq: Every day | ORAL | Status: DC
  Administered 2017-05-15: 40 mg via ORAL
  Filled 2017-05-15: qty 1

## 2017-05-15 MED ORDER — MEGESTROL ACETATE 40 MG PO TABS: 40.0000 mg | ORAL_TABLET | Freq: Two times a day (BID) | ORAL | 0 refills | Status: DC

## 2017-05-15 MED ORDER — CYCLOBENZAPRINE HCL 10 MG PO TABS
5.0000 mg | ORAL_TABLET | Freq: Once | ORAL | Status: AC
  Administered 2017-05-15: 5 mg via ORAL
  Filled 2017-05-15: qty 1

## 2017-05-15 MED ORDER — ACETAMINOPHEN 325 MG PO TABS
650.0000 mg | ORAL_TABLET | Freq: Once | ORAL | Status: AC
  Administered 2017-05-15: 650 mg via ORAL
  Filled 2017-05-15: qty 2

## 2017-05-15 MED ORDER — MORPHINE SULFATE (PF) 4 MG/ML IV SOLN
4.0000 mg | Freq: Once | INTRAVENOUS | Status: AC
  Administered 2017-05-15: 4 mg via INTRAVENOUS
  Filled 2017-05-15: qty 1

## 2017-05-15 MED ORDER — FERROUS SULFATE 325 (65 FE) MG PO TABS: 325.0000 mg | ORAL_TABLET | Freq: Every day | ORAL | 0 refills | Status: DC

## 2017-05-15 NOTE — ED Notes (Signed)
Pt. Called out 2x asking for 'relaxation' medication. Pt. Offered benadryl by EDP, but refused.

## 2017-05-15 NOTE — ED Notes (Addendum)
Pt. Ambulated to restroom with steady gait. Pt. Denies dizziness. Pt. Cleaned herself up and returned to her room. Pt. Asking for IV out and to leave.

## 2017-05-15 NOTE — ED Notes (Addendum)
While standing during orthostatics, pt c/o feeling dizzy; RN, PAnotified

## 2017-05-15 NOTE — ED Notes (Signed)
Pt. Now states she feels faint. EDP made aware. Orthostatics ordered.

## 2017-05-15 NOTE — ED Notes (Signed)
Pt. Provided with sandwich and coke that she requested.

## 2017-05-15 NOTE — Discharge Instructions (Signed)
Please start Iron pills once a day Start Megace which is twice a day until bleeding stops or slows down [facility] Return for worsening symptoms

## 2017-05-15 NOTE — ED Triage Notes (Signed)
Pt reports lower abd pain, sharp in nature and vaginal bleeding since yesterday. States she is going through a pad each hour. Also reports weakness.

## 2017-05-15 NOTE — ED Provider Notes (Signed)
[place] DEPT Provider Note   CSN: [id number] Arrival date & time: 05/15/17  0047   History   Chief Complaint Chief Complaint  Patient presents with  . Abdominal Pain  . Vaginal Bleeding    HPI Emma Turner is a 51 y.o. female who presents with abdominal pain and vaginal bleeding. PMH significant for CAD with recent stenting (on 5/24), uterine fibroids, chronic anemia. She states that she started having heavier vaginal bleeding 3-4 days ago. She has been passing multiple clots and is changing pads once an hour. Today she had associated lightheadedness and weakness therefore came to the ED. She reports associated periumbilical and abdominal pain and pelvic pain. Eating makes the pain and bleeding worse. She says she has uterine fibroids and has had blood transfusions in the past. She is on an aspirin and Plavix due to the recent stent. She also has had intermittent chest pain and shortness of breath. She denies any blood in the stool and denies any history of colonoscopy. She does not have an OBGYN and is not on iron or hormones  HPI  Past Medical History:  Diagnosis Date  . Bipolar 1 disorder ([place])   . Chest pain 04/12/2017  . Coronary artery disease 04/08/2017   A.Cath 04/08/17: DES to mid LAD, 50% stenosis of RCA  . Depression   . Hypertension   . Sleep apnea   . Unstable angina [facility])     Patient Active Problem List   Diagnosis Date Noted  . Pedal edema 04/21/2017  . Depression 04/12/2017  . Bipolar disorder ([place]) 04/12/2017  . OSA (obstructive sleep apnea) 04/12/2017  . Chest pain 04/08/2017  . Essential hypertension 04/08/2017  . Iron deficiency anemia 04/08/2017  . Hypokalemia 04/08/2017  . Unstable angina pectoris ([place]) 04/08/2017  . Coronary artery disease 04/08/2017  . Tobacco abuse   . Unstable angina ([place])   . PTSD (post-traumatic stress disorder) 06/17/2013  . Legal circumstances 06/17/2013  . Homeless 06/17/2013    Past Surgical History:  Procedure  Laterality Date  . CORONARY STENT INTERVENTION Right 04/08/2017   Procedure: Coronary Stent Intervention;  Surgeon: Sherren Mocha, MD;  Location: [place] [facility];  Service: Cardiovascular;  Laterality: Right;  . ECTOPIC PREGNANCY SURGERY    . FOOT SURGERY    . INTRAVASCULAR PRESSURE WIRE/FFR STUDY Right 04/08/2017   Procedure: Intravascular Pressure Wire/FFR Study;  Surgeon: Sherren Mocha, MD;  Location: [place] [facility];  Service: Cardiovascular;  Laterality: Right;  . LEFT HEART CATH AND CORONARY ANGIOGRAPHY N/A 04/08/2017   Procedure: Left Heart Cath and Coronary Angiography;  Surgeon: Sherren Mocha, MD;  Location: [place] [facility];  Service: Cardiovascular;  Laterality: N/A;    OB History    No data available       Home Medications    Prior to Admission medications   Medication Sig Start Date End Date Taking? Authorizing Provider  aspirin EC 81 MG tablet Take 1 tablet (81 mg total) by mouth daily. 04/09/17 04/09/18 Yes Rosita Fire, MD  clopidogrel (PLAVIX) 75 MG tablet Take 1 tablet (75 mg total) by mouth daily. 04/18/17  Yes Carmin Muskrat, MD  furosemide (LASIX) 20 MG tablet Take 1 tablet (20 mg total) by mouth daily. 05/09/17  Yes Drenda Freeze, MD  ibuprofen (ADVIL,MOTRIN) 200 MG tablet Take 400 mg by mouth every 6 (six) hours as needed for moderate pain.   Yes [provider]  lisinopril (PRINIVIL,ZESTRIL) 5 MG tablet Take 1 tablet (5 mg total) by  mouth daily. 05/14/17 05/14/18 Yes Arnoldo Morale, MD  furosemide (LASIX) 40 MG tablet Take 1 tablet (40 mg total) by mouth daily. Patient not taking: Reported on 05/15/2017 05/06/17   Arnoldo Morale, MD  hydrOXYzine (ATARAX/VISTARIL) 25 MG tablet Take 1 tablet (25 mg total) by mouth 3 (three) times daily as needed. 05/06/17   Arnoldo Morale, MD  omeprazole (PRILOSEC) 20 MG capsule Take 1 capsule (20 mg total) by mouth daily. Patient not taking: Reported on 05/09/2017 04/21/17   Horton, Barbette Hair, MD    Family  History Family History  Problem Relation Age of Onset  . Kidney cancer Mother   . Heart attack Brother        In 58s  . Drug abuse Brother   . Stroke Father     Social History Social History  Substance Use Topics  . Smoking status: Current Every Day Smoker    Packs/day: 2.00    Types: Cigarettes  . Smokeless tobacco: Never Used  . Alcohol use No     Allergies   Patient has no known allergies.   Review of Systems Review of Systems  Constitutional: Positive for fever (subjective).  Respiratory: Positive for shortness of breath. Negative for cough.   Cardiovascular: Positive for chest pain. Negative for leg swelling.  Gastrointestinal: Positive for abdominal pain. Negative for blood in stool, constipation, diarrhea, nausea and vomiting.  Genitourinary: Positive for pelvic pain and vaginal bleeding. Negative for dysuria and vaginal discharge.  Neurological: Positive for weakness and light-headedness. Negative for syncope.  All other systems reviewed and are negative.    Physical Exam Updated Vital Signs BP 125/71 (BP Location: Right Arm)   Pulse 85   Temp 98.3 F (36.8 C) (Oral)   Resp 16   Ht 5\' 4"  (1.626 m)   Wt 102.1 kg (225 lb)   LMP  (LMP Unknown) Comment: h/o fibroids  SpO2 100%   BMI 38.62 kg/m   Physical Exam  Constitutional: She is oriented to person, place, and time. She appears well-developed and well-nourished. No distress.  HENT:  Head: Normocephalic and atraumatic.  Eyes: Conjunctivae are normal. Pupils are equal, round, and reactive to light. Right eye exhibits no discharge. Left eye exhibits no discharge. No scleral icterus.  Neck: Normal range of motion.  Cardiovascular: Normal rate and regular rhythm.  Exam reveals no gallop and no friction rub.   No murmur heard. Pulmonary/Chest: Effort normal and breath sounds normal. No respiratory distress. She has no wheezes. She has no rales. She exhibits tenderness (Diffuse chest wall tenderness).    Abdominal: Soft. Bowel sounds are normal. She exhibits no distension and no mass. There is tenderness (periumbilical tenderness). There is no rebound and no guarding. No hernia.  Longitudinal scar from prior surgery  Genitourinary:  Genitourinary Comments: Pelvic: No inguinal lymphadenopathy or inguinal hernia noted. Normal external genitalia. No pain with speculum insertion. There was a wad of tissue in the vagina which was removed with ring forceps. Closed cervical os with normal appearance - no rash or lesions. There are several small clots in the vaginal vault with mild amount of oozing from the cervix. Chaperone present during exam.     Neurological: She is alert and oriented to person, place, and time.  Skin: Skin is warm and dry.  Psychiatric: She has a normal mood and affect. Her behavior is normal.  Nursing note and vitals reviewed.    ED Treatments / Results  Labs (all labs ordered are listed, but only abnormal results  are displayed) Labs Reviewed  WET PREP, GENITAL - Abnormal; Notable for the following:       Result Value   WBC, Wet Prep HPF POC MODERATE (*)    All other components within normal limits  COMPREHENSIVE METABOLIC PANEL - Abnormal; Notable for the following:    Glucose, Bld 130 (*)    Calcium 8.6 (*)    Albumin 3.4 (*)    All other components within normal limits  CBC - Abnormal; Notable for the following:    RBC 3.79 (*)    Hemoglobin 8.4 (*)    HCT 28.4 (*)    MCV 74.9 (*)    MCH 22.2 (*)    MCHC 29.6 (*)    RDW 20.0 (*)    All other components within normal limits  URINALYSIS, ROUTINE W REFLEX MICROSCOPIC - Abnormal; Notable for the following:    APPearance HAZY (*)    Hgb urine dipstick MODERATE (*)    Squamous Epithelial / LPF 0-5 (*)    All other components within normal limits  LIPASE, BLOOD  HCG, QUANTITATIVE, PREGNANCY  TROPONIN I  TROPONIN I  GC/CHLAMYDIA PROBE AMP ([place]) NOT AT [facility]    EKG  EKG  Interpretation  Date/Time:  Saturday May 15 2017 07:31:16 EDT Ventricular Rate:  72 PR Interval:    QRS Duration: 103 QT Interval:  412 QTC Calculation: 451 R Axis:   57 Text Interpretation:  Sinus rhythm Borderline short PR interval No significant change since last tracing Confirmed by Theotis Burrow [phone]) on 05/15/2017 10:37:24 AM       Radiology No results found.  Procedures Procedures (including critical care time)  Medications Ordered in ED Medications  megestrol (MEGACE) tablet 40 mg (40 mg Oral Given 05/15/17 1138)  morphine 4 MG/ML injection 4 mg (4 mg Intravenous Given 05/15/17 0801)  acetaminophen (TYLENOL) tablet 650 mg (650 mg Oral Given 05/15/17 1138)  cyclobenzaprine (FLEXERIL) tablet 5 mg (5 mg Oral Given 05/15/17 1138)     Initial Impression / Assessment and Plan / ED Course  I have reviewed the triage vital signs and the nursing notes.  Pertinent labs & imaging results that were available during my care of the patient were reviewed by me and considered in my medical decision making (see chart for details).  51 year old female with lightheadedness, chest pain, shortness breath, abdominal pain and vaginal bleeding. Her vital signs are normal and she is not significantly orthostatic. Hemoglobin today is 8.4 which does appear to be slowly drifting down. She is not bleeding significantly on exam. CMP overall unremarkable. UA remarkable for moderate hemoglobin is likely contaminant. EKG is sinus rhythm. Initial and second troponin are 0.   Spoke with OB/GYN who recommends commends Megace 40mg  BID till bleeding stops and Iron supplementation and clinic f/u. After discussion with patient she has starting making multiple other complaints. She finally divulges that she is homeless. She is encouraged to continue medicines prescribed to her and f/u with OBGYN and to return if symptoms are worsening.   Final Clinical Impressions(s) / ED Diagnoses   Final diagnoses:   Vaginal bleeding  Chest wall pain  Iron deficiency anemia due to chronic blood loss    New Prescriptions New Prescriptions   No medications on file     Recardo Evangelist, Hershal Coria 05/15/17 [place], Wenda [place], MD 05/16/17 1105

## 2017-05-15 NOTE — ED Notes (Signed)
Pt. States she is homeless and does not want to leave. Pt. D/c at this time. Pt. Given sandwich and drink to take with her.

## 2017-05-17 LAB — GC/CHLAMYDIA PROBE AMP (~~LOC~~) NOT AT ARMC
Chlamydia: NEGATIVE
Neisseria Gonorrhea: NEGATIVE

## 2017-05-18 NOTE — Telephone Encounter (Signed)
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  Pt advised that she is out of town and will not be back for a month or so. I advised pt that she couldn't be released until we get the test and to call us when she gets back into town and we will order it and get it scheduled.  Pt verbalized understanding.

## 2017-05-21 ENCOUNTER — Ambulatory Visit: Payer: 59 | Attending: Family Medicine | Admitting: Family Medicine

## 2017-05-21 ENCOUNTER — Encounter: Payer: Self-pay | Admitting: Family Medicine

## 2017-05-21 VITALS — BP 122/76 | HR 84 | Temp 98.8°F | Resp 19 | Ht 64.0 in | Wt 229.0 lb

## 2017-05-21 DIAGNOSIS — I25118 Atherosclerotic heart disease of native coronary artery with other forms of angina pectoris: Secondary | ICD-10-CM

## 2017-05-21 DIAGNOSIS — R7303 Prediabetes: Secondary | ICD-10-CM | POA: Diagnosis not present

## 2017-05-21 DIAGNOSIS — Z7982 Long term (current) use of aspirin: Secondary | ICD-10-CM | POA: Insufficient documentation

## 2017-05-21 DIAGNOSIS — D649 Anemia, unspecified: Secondary | ICD-10-CM | POA: Insufficient documentation

## 2017-05-21 DIAGNOSIS — I10 Essential (primary) hypertension: Secondary | ICD-10-CM

## 2017-05-21 DIAGNOSIS — Z955 Presence of coronary angioplasty implant and graft: Secondary | ICD-10-CM | POA: Insufficient documentation

## 2017-05-21 DIAGNOSIS — N920 Excessive and frequent menstruation with regular cycle: Secondary | ICD-10-CM | POA: Insufficient documentation

## 2017-05-21 DIAGNOSIS — Z87891 Personal history of nicotine dependence: Secondary | ICD-10-CM | POA: Insufficient documentation

## 2017-05-21 DIAGNOSIS — D500 Iron deficiency anemia secondary to blood loss (chronic): Secondary | ICD-10-CM | POA: Diagnosis not present

## 2017-05-21 DIAGNOSIS — R600 Localized edema: Secondary | ICD-10-CM

## 2017-05-21 DIAGNOSIS — Z79899 Other long term (current) drug therapy: Secondary | ICD-10-CM | POA: Insufficient documentation

## 2017-05-21 DIAGNOSIS — I2510 Atherosclerotic heart disease of native coronary artery without angina pectoris: Secondary | ICD-10-CM | POA: Insufficient documentation

## 2017-05-21 DIAGNOSIS — J40 Bronchitis, not specified as acute or chronic: Secondary | ICD-10-CM

## 2017-05-21 DIAGNOSIS — Z7902 Long term (current) use of antithrombotics/antiplatelets: Secondary | ICD-10-CM | POA: Insufficient documentation

## 2017-05-21 MED ORDER — GUAIFENESIN ER 600 MG PO TB12: 600.0000 mg | ORAL_TABLET | Freq: Two times a day (BID) | ORAL | 0 refills | Status: DC

## 2017-05-21 MED ORDER — ALBUTEROL SULFATE HFA 108 (90 BASE) MCG/ACT IN AERS: 2.0000 | INHALATION_SPRAY | Freq: Four times a day (QID) | RESPIRATORY_TRACT | 2 refills | Status: DC | PRN

## 2017-05-21 MED FILL — VENTOLIN HFA 90 MCG INHALER: 108 (90 BAS | 25 days supply | Qty: 18 | Fill #0

## 2017-05-21 NOTE — Progress Notes (Signed)
Subjective:  Patient ID: Emma Turner, female    DOB: 12/06/1965  Age: 51 y.o. MRN: [id number]  CC: Follow-up   HPI Emma Turner is a 51 year old female with a history of coronary artery disease (status post DES to the LAD on 03/2017 and currently on aspirin and Plavix), Tobacco abuse, hypertension, bipolar disorder, posttraumatic stress disorder, suspected sleep apnea, several ED visits for chest pain who presents today for follow-up visit.  At her last visit she was placed on Lasix for pedal edema and reports improvement in symptoms.  She was seen at the ED one week ago for menorrhagia,found to be anemic with hemoglobin of 8.4  and was placed on Megace and ferrous sulfate with recommendations to see GYN. She informs me today that she has had heavy periods passage of clots and does feel weak and lightheaded.  She also complains of cough which is occasionally productive of yellowish sputum and sometimes is unable to get the sputum out of her chest.  She also complains of pins and needles for the last couple of days which are intermittent and occurs sometimes in her fingers and at other times in her feet. She does have a history of prediabetes with an A1c of 6.1 and is on diet control. She would like to hold off of her commence medications for this  Past Medical History:  Diagnosis Date  . Bipolar 1 disorder ([place])   . Chest pain 04/12/2017  . Coronary artery disease 04/08/2017   A.Cath 04/08/17: DES to mid LAD, 50% stenosis of RCA  . Depression   . Hypertension   . Sleep apnea   . Unstable angina [facility])     Past Surgical History:  Procedure Laterality Date  . CORONARY STENT INTERVENTION Right 04/08/2017   Procedure: Coronary Stent Intervention;  Surgeon: Sherren Mocha, MD;  Location: [place] [facility];  Service: Cardiovascular;  Laterality: Right;  . ECTOPIC PREGNANCY SURGERY    . FOOT SURGERY    . INTRAVASCULAR PRESSURE WIRE/FFR STUDY Right 04/08/2017   Procedure: Intravascular  Pressure Wire/FFR Study;  Surgeon: Sherren Mocha, MD;  Location: [place] [facility];  Service: Cardiovascular;  Laterality: Right;  . LEFT HEART CATH AND CORONARY ANGIOGRAPHY N/A 04/08/2017   Procedure: Left Heart Cath and Coronary Angiography;  Surgeon: Sherren Mocha, MD;  Location: [place] [facility];  Service: Cardiovascular;  Laterality: N/A;    No Known Allergies    Outpatient Medications Prior to Visit  Medication Sig Dispense Refill  . aspirin EC 81 MG tablet Take 1 tablet (81 mg total) by mouth daily. 30 tablet 0  . clopidogrel (PLAVIX) 75 MG tablet Take 1 tablet (75 mg total) by mouth daily. 30 tablet 0  . ferrous sulfate 325 (65 FE) MG tablet Take 1 tablet (325 mg total) by mouth daily. 30 tablet 0  . furosemide (LASIX) 20 MG tablet Take 1 tablet (20 mg total) by mouth daily. 5 tablet 0  . hydrOXYzine (ATARAX/VISTARIL) 25 MG tablet Take 1 tablet (25 mg total) by mouth 3 (three) times daily as needed. 90 tablet 1  . ibuprofen (ADVIL,MOTRIN) 200 MG tablet Take 400 mg by mouth every 6 (six) hours as needed for moderate pain.    Marland Kitchen lisinopril (PRINIVIL,ZESTRIL) 5 MG tablet Take 1 tablet (5 mg total) by mouth daily. 30 tablet 3  . megestrol (MEGACE) 40 MG tablet Take 1 tablet (40 mg total) by mouth 2 (two) times daily. 14 tablet 0   No facility-administered medications prior  to visit.     ROS Review of Systems  Constitutional: Negative for activity change, appetite change and fatigue.  HENT: Negative for congestion, sinus pressure and sore throat.   Eyes: Negative for visual disturbance.  Respiratory: Positive for cough and shortness of breath. Negative for chest tightness and wheezing.   Cardiovascular: Negative for chest pain and palpitations.  Gastrointestinal: Negative for abdominal distention, abdominal pain and constipation.  Endocrine: Negative for polydipsia.  Genitourinary: Positive for menstrual problem. Negative for dysuria and frequency.  Musculoskeletal: Negative  for arthralgias and back pain.  Skin: Negative for rash.  Neurological: Negative for tremors, light-headedness and numbness.  Hematological: Does not bruise/bleed easily.  Psychiatric/Behavioral: Negative for agitation and behavioral problems.    Objective:  BP 122/76 (BP Location: Right Arm, Patient Position: Sitting, Cuff Size: Large)   Pulse 84   Temp 98.8 F (37.1 C) (Oral)   Resp 19   Ht 5\' 4"  (1.626 m)   Wt 229 lb (103.9 kg)   LMP 05/18/2017 (Exact Date) Comment: h/o fibroids  SpO2 100%   BMI 39.31 kg/m   BP/Weight 05/21/2017 05/15/2017 9/37/1696  Systolic BP 789 381 017  Diastolic BP 76 68 61  Wt. (Lbs) 229 225 229  BMI 39.31 38.62 39.31      Physical Exam  Constitutional: She is oriented to person, place, and time. She appears well-developed and well-nourished.  HENT:  Right Ear: External ear normal.  Left Ear: External ear normal.  Mouth/Throat: Oropharynx is clear and moist.  Cardiovascular: Normal rate, normal heart sounds and intact distal pulses.   No murmur heard. Pulmonary/Chest: Effort normal. She has no wheezes. She has rales. She exhibits no tenderness.  Abdominal: Soft. Bowel sounds are normal. She exhibits no distension and no mass. There is no tenderness.  Musculoskeletal: Normal range of motion.  Neurological: She is alert and oriented to person, place, and time.    CMP Latest Ref Rng & Units 05/15/2017 05/09/2017 04/23/2017  Glucose 65 - 99 mg/dL 130(H) 119(H) 103(H)  BUN 6 - 20 mg/dL 14 13 12   Creatinine 0.44 - 1.00 mg/dL 0.98 0.86 0.83  Sodium 135 - 145 mmol/L 137 139 142  Potassium 3.5 - 5.1 mmol/L 3.5 3.7 4.2  Chloride 101 - 111 mmol/L 105 106 104  CO2 22 - 32 mmol/L 24 25 21   Calcium 8.9 - 10.3 mg/dL 8.6(L) 8.8(L) 9.2  Total Protein 6.5 - 8.1 g/dL 6.7 7.1 -  Total Bilirubin 0.3 - 1.2 mg/dL 0.4 0.4 -  Alkaline Phos 38 - 126 U/L 91 91 -  AST 15 - 41 U/L 23 23 -  ALT 14 - 54 U/L 18 16 -    CBC    Component Value Date/Time   WBC 6.9  05/15/2017 0106   RBC 3.79 (L) 05/15/2017 0106   HGB 8.4 (L) 05/15/2017 0106   HGB 9.8 (L) 04/23/2017 1052   HCT 28.4 (L) 05/15/2017 0106   HCT 32.5 (L) 04/23/2017 1052   PLT 311 05/15/2017 0106   PLT 300 04/23/2017 1052   MCV 74.9 (L) 05/15/2017 0106   MCV 70 (L) 04/23/2017 1052   MCH 22.2 (L) 05/15/2017 0106   MCHC 29.6 (L) 05/15/2017 0106   RDW 20.0 (H) 05/15/2017 0106   RDW 19.1 (H) 04/23/2017 1052   LYMPHSABS 1.7 05/09/2017 0328   LYMPHSABS 1.3 04/23/2017 1052   MONOABS 0.4 05/09/2017 0328   EOSABS 0.2 05/09/2017 0328   EOSABS 0.2 04/23/2017 1052   BASOSABS 0.0 05/09/2017 0328  BASOSABS 0.0 04/23/2017 1052     Assessment & Plan:   1. Prediabetes Diet controlled with A1c of 6.1   2. Pedal edema Continue Lasix We'll check potassium level - Basic metabolic panel  3. Essential hypertension Controlled   4. Coronary artery disease involving native coronary artery of native heart with other form of angina pectoris ([place]) Status post DES on 03/2017 Continue dual antiplatelet therapy with aspirin and Plavix   5. Menorrhagia with regular cycle Will need to exclude fibroids Referred for ultrasound of the pelvis and transvaginal ultrasound Continue Megace   6. Bronchitis We'll see back at next visit and determine if she will need an ICS/LABA given history of smoking as she could have underlying COPD. - guaiFENesin (MUCINEX) 600 MG 12 hr tablet; Take 1 tablet (600 mg total) by mouth 2 (two) times daily.  Dispense: 30 tablet; Refill: 0 - albuterol (PROVENTIL HFA;VENTOLIN HFA) 108 (90 Base) MCG/ACT inhaler; Inhale 2 puffs into the lungs every 6 (six) hours as needed for wheezing or shortness of breath.  Dispense: 1 Inhaler; Refill: 2  7. Iron deficiency anemia due to chronic blood loss Continue ferrous sulfate - CBC with Differential/Platelet - US Transvaginal Non-OB; Future - US Pelvis Complete; Future   Meds ordered this encounter  Medications  . guaiFENesin  (MUCINEX) 600 MG 12 hr tablet    Sig: Take 1 tablet (600 mg total) by mouth 2 (two) times daily.    Dispense:  30 tablet    Refill:  0  . albuterol (PROVENTIL HFA;VENTOLIN HFA) 108 (90 Base) MCG/ACT inhaler    Sig: Inhale 2 puffs into the lungs every 6 (six) hours as needed for wheezing or shortness of breath.    Dispense:  1 Inhaler    Refill:  2    Follow-up: Return in about 6 weeks (around 07/02/2017) for Follow-up on bronchitis and anemia.   Arnoldo Morale MD

## 2017-05-22 LAB — CBC WITH DIFFERENTIAL/PLATELET
BASOS ABS: 0 10*3/uL (ref 0.0–0.2)
Basos: 0 %
EOS (ABSOLUTE): 0.1 10*3/uL (ref 0.0–0.4)
EOS: 1 %
HEMATOCRIT: 27.9 % — AB (ref 34.0–46.6)
HEMOGLOBIN: 8 g/dL — AB (ref 11.1–15.9)
IMMATURE GRANS (ABS): 0 10*3/uL (ref 0.0–0.1)
IMMATURE GRANULOCYTES: 0 %
LYMPHS: 20 %
Lymphocytes Absolute: 1.3 10*3/uL (ref 0.7–3.1)
MCH: 21.1 pg — ABNORMAL LOW (ref 26.6–33.0)
MCHC: 28.7 g/dL — ABNORMAL LOW (ref 31.5–35.7)
MCV: 73 fL — ABNORMAL LOW (ref 79–97)
MONOCYTES: 5 %
Monocytes Absolute: 0.3 10*3/uL (ref 0.1–0.9)
NEUTROS PCT: 74 %
Neutrophils Absolute: 4.6 10*3/uL (ref 1.4–7.0)
Platelets: 325 10*3/uL (ref 150–379)
RBC: 3.8 x10E6/uL (ref 3.77–5.28)
RDW: 20.7 % — ABNORMAL HIGH (ref 12.3–15.4)
WBC: 6.3 10*3/uL (ref 3.4–10.8)

## 2017-05-22 LAB — BASIC METABOLIC PANEL
BUN / CREAT RATIO: 18 (ref 9–23)
BUN: 13 mg/dL (ref 6–24)
CALCIUM: 8.7 mg/dL (ref 8.7–10.2)
CO2: 19 mmol/L — AB (ref 20–29)
CREATININE: 0.74 mg/dL (ref 0.57–1.00)
Chloride: 104 mmol/L (ref 96–106)
GFR calc Af Amer: 108 mL/min/{1.73_m2} (ref >59)
GFR calc non Af Amer: 94 mL/min/{1.73_m2} (ref >59)
GLUCOSE: 98 mg/dL (ref 65–99)
Potassium: 4.2 mmol/L (ref 3.5–5.2)
Sodium: 142 mmol/L (ref 134–144)

## 2017-05-24 ENCOUNTER — Telehealth: Payer: Self-pay | Admitting: Cardiology

## 2017-05-24 ENCOUNTER — Ambulatory Visit (HOSPITAL_COMMUNITY)
Admission: RE | Admit: 2017-05-24 | Discharge: 2017-05-24 | Disposition: A | Discharge status: Home / Self Care | Payer: 59 | Source: Ambulatory Visit | Attending: Family Medicine | Admitting: Family Medicine

## 2017-05-24 DIAGNOSIS — D500 Iron deficiency anemia secondary to blood loss (chronic): Secondary | ICD-10-CM

## 2017-05-24 DIAGNOSIS — D251 Intramural leiomyoma of uterus: Secondary | ICD-10-CM | POA: Insufficient documentation

## 2017-05-24 DIAGNOSIS — R938 Abnormal findings on diagnostic imaging of other specified body structures: Secondary | ICD-10-CM | POA: Insufficient documentation

## 2017-05-24 NOTE — Telephone Encounter (Signed)
Patient called in asking for extension on her FMLA that [employer] completed. She stated she needs it extended for 30 more days until she can have stress test scheduled.

## 2017-05-24 NOTE — Telephone Encounter (Signed)
Returned pts call and and she was transferred to medical records, re: fmla.

## 2017-05-24 NOTE — Telephone Encounter (Signed)
New message    Pt is calling about a medical release form she needs. Please call.

## 2017-05-25 ENCOUNTER — Telehealth: Payer: Self-pay | Admitting: Family Medicine

## 2017-05-25 NOTE — Telephone Encounter (Signed)
Pt calling stating the sleep med that she was prescribed is not working for her and she would like to know if she may be prescribed "Xanax or something" to help her rest. Please f/u. Thank you.

## 2017-05-26 ENCOUNTER — Inpatient Hospital Stay (HOSPITAL_COMMUNITY): Payer: 59

## 2017-05-26 ENCOUNTER — Telehealth: Payer: Self-pay | Admitting: Family Medicine

## 2017-05-26 ENCOUNTER — Encounter (HOSPITAL_COMMUNITY): Payer: Self-pay | Admitting: *Deleted

## 2017-05-26 ENCOUNTER — Observation Stay (HOSPITAL_COMMUNITY)
Admission: AD | Admit: 2017-05-26 | Discharge: 2017-05-28 | Disposition: A | Discharge status: Home / Self Care | Payer: 59 | Source: Ambulatory Visit | Attending: Emergency Medicine | Admitting: Emergency Medicine

## 2017-05-26 DIAGNOSIS — Z7902 Long term (current) use of antithrombotics/antiplatelets: Secondary | ICD-10-CM | POA: Insufficient documentation

## 2017-05-26 DIAGNOSIS — R079 Chest pain, unspecified: Secondary | ICD-10-CM | POA: Diagnosis present

## 2017-05-26 DIAGNOSIS — F319 Bipolar disorder, unspecified: Secondary | ICD-10-CM | POA: Insufficient documentation

## 2017-05-26 DIAGNOSIS — Z7982 Long term (current) use of aspirin: Secondary | ICD-10-CM | POA: Insufficient documentation

## 2017-05-26 DIAGNOSIS — D649 Anemia, unspecified: Secondary | ICD-10-CM

## 2017-05-26 DIAGNOSIS — F17210 Nicotine dependence, cigarettes, uncomplicated: Secondary | ICD-10-CM | POA: Insufficient documentation

## 2017-05-26 DIAGNOSIS — D500 Iron deficiency anemia secondary to blood loss (chronic): Secondary | ICD-10-CM | POA: Insufficient documentation

## 2017-05-26 DIAGNOSIS — I2510 Atherosclerotic heart disease of native coronary artery without angina pectoris: Secondary | ICD-10-CM | POA: Diagnosis present

## 2017-05-26 DIAGNOSIS — D509 Iron deficiency anemia, unspecified: Secondary | ICD-10-CM | POA: Diagnosis present

## 2017-05-26 DIAGNOSIS — I208 Other forms of angina pectoris: Principal | ICD-10-CM | POA: Insufficient documentation

## 2017-05-26 DIAGNOSIS — I10 Essential (primary) hypertension: Secondary | ICD-10-CM | POA: Insufficient documentation

## 2017-05-26 DIAGNOSIS — Z590 Homelessness: Secondary | ICD-10-CM | POA: Insufficient documentation

## 2017-05-26 DIAGNOSIS — R072 Precordial pain: Secondary | ICD-10-CM | POA: Insufficient documentation

## 2017-05-26 DIAGNOSIS — Z79899 Other long term (current) drug therapy: Secondary | ICD-10-CM | POA: Insufficient documentation

## 2017-05-26 DIAGNOSIS — N939 Abnormal uterine and vaginal bleeding, unspecified: Secondary | ICD-10-CM

## 2017-05-26 HISTORY — DX: Anxiety disorder, unspecified: F41.9

## 2017-05-26 HISTORY — DX: Acute myocardial infarction, unspecified: I21.9

## 2017-05-26 HISTORY — DX: Unspecified osteoarthritis, unspecified site: M19.90

## 2017-05-26 HISTORY — DX: Other specified postprocedural states: Z98.890

## 2017-05-26 HISTORY — DX: Anemia, unspecified: D64.9

## 2017-05-26 HISTORY — DX: Personal history of other medical treatment: Z92.89

## 2017-05-26 HISTORY — DX: Other specified postprocedural states: R11.2

## 2017-05-26 HISTORY — DX: Personal history of other diseases of the digestive system: Z87.19

## 2017-05-26 HISTORY — DX: Migraine, unspecified, not intractable, without status migrainosus: G43.909

## 2017-05-26 LAB — BASIC METABOLIC PANEL
Anion gap: 8 (ref 5–15)
BUN: 16 mg/dL (ref 6–20)
CHLORIDE: 106 mmol/L (ref 101–111)
CO2: 24 mmol/L (ref 22–32)
CREATININE: 0.83 mg/dL (ref 0.44–1.00)
Calcium: 8.6 mg/dL — ABNORMAL LOW (ref 8.9–10.3)
GFR calc Af Amer: >60 mL/min (ref >60)
GFR calc non Af Amer: >60 mL/min (ref >60)
GLUCOSE: 120 mg/dL — AB (ref 65–99)
POTASSIUM: 3.5 mmol/L (ref 3.5–5.1)
SODIUM: 138 mmol/L (ref 135–145)

## 2017-05-26 LAB — CBC
HEMATOCRIT: 26.8 % — AB (ref 36.0–46.0)
Hemoglobin: 7.8 g/dL — ABNORMAL LOW (ref 12.0–15.0)
MCH: 21.5 pg — ABNORMAL LOW (ref 26.0–34.0)
MCHC: 29.1 g/dL — ABNORMAL LOW (ref 30.0–36.0)
MCV: 73.8 fL — AB (ref 78.0–100.0)
PLATELETS: 313 10*3/uL (ref 150–400)
RBC: 3.63 MIL/uL — AB (ref 3.87–5.11)
RDW: 19.4 % — ABNORMAL HIGH (ref 11.5–15.5)
WBC: 7.1 10*3/uL (ref 4.0–10.5)

## 2017-05-26 LAB — URINALYSIS, ROUTINE W REFLEX MICROSCOPIC
Bilirubin Urine: NEGATIVE
Glucose, UA: NEGATIVE mg/dL
KETONES UR: NEGATIVE mg/dL
Leukocytes, UA: NEGATIVE
Nitrite: NEGATIVE
PH: 5 (ref 5.0–8.0)
Protein, ur: NEGATIVE mg/dL
RBC / HPF: NONE SEEN RBC/hpf (ref 0–5)
SPECIFIC GRAVITY, URINE: 1.013 (ref 1.005–1.030)

## 2017-05-26 LAB — I-STAT TROPONIN, ED: Troponin i, poc: 0 ng/mL (ref 0.00–0.08)

## 2017-05-26 LAB — POCT PREGNANCY, URINE: Preg Test, Ur: NEGATIVE

## 2017-05-26 MED ORDER — ASPIRIN 81 MG PO CHEW
324.0000 mg | CHEWABLE_TABLET | Freq: Once | ORAL | Status: AC
  Administered 2017-05-27: 324 mg via ORAL
  Filled 2017-05-26: qty 4

## 2017-05-26 MED ORDER — IBUPROFEN 600 MG PO TABS
600.0000 mg | ORAL_TABLET | Freq: Once | ORAL | Status: AC
  Administered 2017-05-26: 600 mg via ORAL
  Filled 2017-05-26: qty 1

## 2017-05-26 MED ORDER — MORPHINE SULFATE (PF) 4 MG/ML IV SOLN
2.0000 mg | Freq: Once | INTRAVENOUS | Status: AC
  Administered 2017-05-27: 2 mg via INTRAVENOUS
  Filled 2017-05-26: qty 1

## 2017-05-26 MED ORDER — ONDANSETRON HCL 4 MG/2ML IJ SOLN
4.0000 mg | Freq: Once | INTRAMUSCULAR | Status: AC
  Administered 2017-05-27: 4 mg via INTRAVENOUS
  Filled 2017-05-26: qty 2

## 2017-05-26 NOTE — ED Notes (Signed)
ED Provider at bedside. 

## 2017-05-26 NOTE — Telephone Encounter (Signed)
Called pt. To reschedule appt. That was for 07/05/17. Pt. Stated that she is not able to sleep and would like to know if she can be prescribed Xanax. Please f/u with pt.

## 2017-05-26 NOTE — MAU Note (Signed)
Pt had U/S @ [facility] on Monday, was then told to come to [facility] to be seen by OB/GYN for fibroid tumors for possible surgery.  Has had lower abd pain for "a long time."  Started bleeding after U/S on Monday, still bleeding heavily today.  Hgb was 8 last week.

## 2017-05-26 NOTE — ED Provider Notes (Signed)
[place] DEPT Provider Note   CSN: [id number] Arrival date & time: 05/26/17  1706     History   Chief Complaint Chief Complaint  Patient presents with  . Abdominal Pain  . Vaginal Bleeding    HPI Emma Turner is a 51 y.o. female.  Patient status post non-STEMI on May of this year. Patient had stent placed at that time. Patient was over at [facility] hospital for vaginal bleeding. Is showing some borderline anemia. Patient states she had onset of chest pain at 9 this morning. Has persisted. Patient was transferred from women's after cleared by them for the vaginal bleeding issues for evaluation for the chest pain. Patient was seen at [facility] long on June 24. At that time she was cleared for discharge home and recommend follow-up with cardiology. Patient has yet to see cardiology in the office since that time.  Patient states the pain radiates is left-sided substernal radiates to her left arm. Up to her left neck. It has improved with nitroglycerin but has not resolved completely. Patient has not had any aspirin today. Will give aspirin here. Patient states that pain has waxed and waned throughout the day and has not resolved completely. But it is improved currently.      Past Medical History:  Diagnosis Date  . Anemia   . Bipolar 1 disorder ([place])   . Chest pain 04/12/2017  . Coronary artery disease 04/08/2017   A.Cath 04/08/17: DES to mid LAD, 50% stenosis of RCA  . Depression   . Hypertension   . Unstable angina [facility])     Patient Active Problem List   Diagnosis Date Noted  . Prediabetes 05/21/2017  . Menorrhagia 05/21/2017  . Anemia 05/21/2017  . Pedal edema 04/21/2017  . Depression 04/12/2017  . Bipolar disorder ([place]) 04/12/2017  . OSA (obstructive sleep apnea) 04/12/2017  . Chest pain 04/08/2017  . Essential hypertension 04/08/2017  . Iron deficiency anemia 04/08/2017  . Hypokalemia 04/08/2017  . Unstable angina pectoris ([place]) 04/08/2017  . Coronary artery  disease 04/08/2017  . Tobacco abuse   . Unstable angina ([place])   . PTSD (post-traumatic stress disorder) 06/17/2013  . Legal circumstances 06/17/2013  . Homeless 06/17/2013    Past Surgical History:  Procedure Laterality Date  . CORONARY STENT INTERVENTION Right 04/08/2017   Procedure: Coronary Stent Intervention;  Surgeon: Sherren Mocha, MD;  Location: [place] [facility];  Service: Cardiovascular;  Laterality: Right;  . ECTOPIC PREGNANCY SURGERY    . FOOT SURGERY    . INTRAVASCULAR PRESSURE WIRE/FFR STUDY Right 04/08/2017   Procedure: Intravascular Pressure Wire/FFR Study;  Surgeon: Sherren Mocha, MD;  Location: [place] [facility];  Service: Cardiovascular;  Laterality: Right;  . LEFT HEART CATH AND CORONARY ANGIOGRAPHY N/A 04/08/2017   Procedure: Left Heart Cath and Coronary Angiography;  Surgeon: Sherren Mocha, MD;  Location: [place] [facility];  Service: Cardiovascular;  Laterality: N/A;    OB History    Gravida Para Term Preterm AB Living   5 4 4   1 4    SAB TAB Ectopic Multiple Live Births   1       4       Home Medications    Prior to Admission medications   Medication Sig Start Date End Date Taking? Authorizing Provider  albuterol (PROVENTIL HFA;VENTOLIN HFA) 108 (90 Base) MCG/ACT inhaler Inhale 2 puffs into the lungs every 6 (six) hours as needed for wheezing or shortness of breath. 05/21/17  Yes Arnoldo Morale, MD  aspirin  EC 81 MG tablet Take 1 tablet (81 mg total) by mouth daily. 04/09/17 04/09/18 Yes Rosita Fire, MD  clopidogrel (PLAVIX) 75 MG tablet Take 1 tablet (75 mg total) by mouth daily. 04/18/17  Yes Carmin Muskrat, MD  ferrous sulfate 325 (65 FE) MG tablet Take 1 tablet (325 mg total) by mouth daily. 05/15/17  Yes Recardo Evangelist, PA-C  furosemide (LASIX) 20 MG tablet Take 1 tablet (20 mg total) by mouth daily. 05/09/17  Yes Drenda Freeze, MD  ibuprofen (ADVIL,MOTRIN) 200 MG tablet Take 400 mg by mouth every 6 (six) hours as needed for moderate  pain.   Yes [provider]  lisinopril (PRINIVIL,ZESTRIL) 5 MG tablet Take 1 tablet (5 mg total) by mouth daily. 05/14/17 05/14/18 Yes Arnoldo Morale, MD  guaiFENesin (MUCINEX) 600 MG 12 hr tablet Take 1 tablet (600 mg total) by mouth 2 (two) times daily. Patient not taking: Reported on 05/26/2017 05/21/17   Arnoldo Morale, MD  hydrOXYzine (ATARAX/VISTARIL) 25 MG tablet Take 1 tablet (25 mg total) by mouth 3 (three) times daily as needed. Patient not taking: Reported on 05/26/2017 05/06/17   Arnoldo Morale, MD  megestrol (MEGACE) 40 MG tablet Take 1 tablet (40 mg total) by mouth 2 (two) times daily. Patient not taking: Reported on 05/26/2017 05/15/17   Recardo Evangelist, PA-C    Family History Family History  Problem Relation Age of Onset  . Kidney cancer Mother   . Heart attack Brother        In 5s  . Drug abuse Brother   . Stroke Father     Social History Social History  Substance Use Topics  . Smoking status: Current Every Day Smoker    Packs/day: 0.50    Types: Cigarettes  . Smokeless tobacco: Current User  . Alcohol use No     Allergies   Patient has no known allergies.   Review of Systems Review of Systems  Constitutional: Negative for fever.  HENT: Negative for congestion.   Eyes: Negative for visual disturbance.  Respiratory: Negative for shortness of breath.   Cardiovascular: Positive for chest pain.  Gastrointestinal: Negative for abdominal pain.  Genitourinary: Negative for dysuria.  Musculoskeletal: Negative for back pain.  Skin: Negative for rash.  Neurological: Negative for syncope.  Hematological: Does not bruise/bleed easily.  Psychiatric/Behavioral: Negative for confusion.     Physical Exam Updated Vital Signs BP 123/86   Pulse 70   Temp 98.7 F (37.1 C) (Oral)   Resp 17   LMP 05/24/2017 Comment: h/o fibroids  SpO2 97%   Physical Exam  Constitutional: She is oriented to person, place, and time. She appears well-developed and  well-nourished. No distress.  HENT:  Head: Normocephalic and atraumatic.  Mouth/Throat: Oropharynx is clear and moist.  Eyes: Conjunctivae and EOM are normal. Pupils are equal, round, and reactive to light.  Neck: Normal range of motion. Neck supple.  Cardiovascular: Normal rate, regular rhythm and normal heart sounds.   Pulmonary/Chest: Effort normal and breath sounds normal. No respiratory distress.  Abdominal: Soft. Bowel sounds are normal. There is no tenderness.  Musculoskeletal: Normal range of motion.  Neurological: She is alert and oriented to person, place, and time. No cranial nerve deficit. She exhibits normal muscle tone. Coordination normal.  Skin: Skin is warm.  Nursing note and vitals reviewed.    ED Treatments / Results  Labs (all labs ordered are listed, but only abnormal results are displayed) Labs Reviewed  URINALYSIS, ROUTINE W REFLEX MICROSCOPIC -  Abnormal; Notable for the following:       Result Value   Color, Urine STRAW (*)    Hgb urine dipstick SMALL (*)    Bacteria, UA RARE (*)    Squamous Epithelial / LPF 0-5 (*)    All other components within normal limits  CBC - Abnormal; Notable for the following:    RBC 3.63 (*)    Hemoglobin 7.8 (*)    HCT 26.8 (*)    MCV 73.8 (*)    MCH 21.5 (*)    MCHC 29.1 (*)    RDW 19.4 (*)    All other components within normal limits  BASIC METABOLIC PANEL - Abnormal; Notable for the following:    Glucose, Bld 120 (*)    Calcium 8.6 (*)    All other components within normal limits  POCT PREGNANCY, URINE  I-STAT TROPOININ, ED   Results for orders placed or performed during the hospital encounter of 05/26/17  Urinalysis, Routine w reflex microscopic  Result Value Ref Range   Color, Urine STRAW (A) YELLOW   APPearance CLEAR CLEAR   Specific Gravity, Urine 1.013 1.005 - 1.030   pH 5.0 5.0 - 8.0   Glucose, UA NEGATIVE NEGATIVE mg/dL   Hgb urine dipstick SMALL (A) NEGATIVE   Bilirubin Urine NEGATIVE NEGATIVE    Ketones, ur NEGATIVE NEGATIVE mg/dL   Protein, ur NEGATIVE NEGATIVE mg/dL   Nitrite NEGATIVE NEGATIVE   Leukocytes, UA NEGATIVE NEGATIVE   RBC / HPF NONE SEEN 0 - 5 RBC/hpf   WBC, UA 0-5 0 - 5 WBC/hpf   Bacteria, UA RARE (A) NONE SEEN   Squamous Epithelial / LPF 0-5 (A) NONE SEEN   Mucous PRESENT   CBC  Result Value Ref Range   WBC 7.1 4.0 - 10.5 K/uL   RBC 3.63 (L) 3.87 - 5.11 MIL/uL   Hemoglobin 7.8 (L) 12.0 - 15.0 g/dL   HCT 26.8 (L) 36.0 - 46.0 %   MCV 73.8 (L) 78.0 - 100.0 fL   MCH 21.5 (L) 26.0 - 34.0 pg   MCHC 29.1 (L) 30.0 - 36.0 g/dL   RDW 19.4 (H) 11.5 - 15.5 %   Platelets 313 150 - 400 K/uL  Basic metabolic panel  Result Value Ref Range   Sodium 138 135 - 145 mmol/L   Potassium 3.5 3.5 - 5.1 mmol/L   Chloride 106 101 - 111 mmol/L   CO2 24 22 - 32 mmol/L   Glucose, Bld 120 (H) 65 - 99 mg/dL   BUN 16 6 - 20 mg/dL   Creatinine, Ser 0.83 0.44 - 1.00 mg/dL   Calcium 8.6 (L) 8.9 - 10.3 mg/dL   GFR calc non Af Amer >60 >60 mL/min   GFR calc Af Amer >60 >60 mL/min   Anion gap 8 5 - 15  Pregnancy, urine POC  Result Value Ref Range   Preg Test, Ur NEGATIVE NEGATIVE  I-stat troponin, ED  Result Value Ref Range   Troponin i, poc 0.00 0.00 - 0.08 ng/mL   Comment 3            EKG  EKG Interpretation None       Radiology Dg Chest 2 View  Result Date: 05/26/2017 CLINICAL DATA:  Chest pain. EXAM: CHEST  2 VIEW COMPARISON:  Radiograph 05/09/2017.  CTA 04/22/2015 FINDINGS: The cardiomediastinal contours are unchanged with borderline mild cardiomegaly. Mild bronchial thickening. Pulmonary vasculature is normal. No consolidation, pleural effusion, or pneumothorax. No acute osseous abnormalities are seen.  IMPRESSION: Chronic bronchial thickening. Stable borderline cardiomegaly. No acute abnormality. Electronically Signed   By: Jeb Levering M.D.   On: 05/26/2017 22:09    Procedures Procedures (including critical care time)  Medications Ordered in ED Medications    ibuprofen (ADVIL,MOTRIN) tablet 600 mg (600 mg Oral Given 05/26/17 1920)     Initial Impression / Assessment and Plan / ED Course  I have reviewed the triage vital signs and the nursing notes.  Pertinent labs & imaging results that were available during my care of the patient were reviewed by me and considered in my medical decision making (see chart for details).      Patient with a persistent chest pain. Status post stent placement in May. Patient will require admission for cardiac rule out. Patient evaluated for vaginal bleeding at [facility] earlier. Does have anemia does have a hemoglobin of 7.8.  Patient chest pain improves some with nitroglycerin. Patient wasn't given any aspirin. Given aspirin here. Patient was given Motrin at [facility] hospital.  Patient will require admission for cardiac rule out probably consultation by cardiology.   Regarding the anemia. The patient's hemoglobin just a few days ago was 8. [facility] give her the diagnosis of abnormal vaginal bleeding. An iron deficiency anemia due to chronic blood loss.   Final Clinical Impressions(s) / ED Diagnoses   Final diagnoses:  Other forms of angina pectoris (HCC)  Abnormal vaginal bleeding  Iron deficiency anemia due to chronic blood loss  Precordial pain    New Prescriptions New Prescriptions   No medications on file     Fredia Sorrow, MD 05/26/17 2352

## 2017-05-26 NOTE — MAU Provider Note (Signed)
History     CSN: [id number]  Arrival date and time: 05/26/17 1706   First Provider Initiated Contact with Patient 05/26/17 1854      Chief Complaint  Patient presents with  . Abdominal Pain  . Vaginal Bleeding   HPI   [name] is a 51 y.o. female [phone] with a history of uterine fibroids, and menorrhagia,  here in MAU with heavy vaginal bleeding. Says she started bleeding on Monday and it is really heavy. She had this same thing happen in December and received 2 blood transfusions. She is not taking megace at this time, however was taking it in the past. On Plavix for heart stent.  Patient says she had a MI last month. She was seen by her PCP on 7/6 who ordered the pelvic US which showed Heterogeneous uterus with small intramural fibroids. Patient was given Megace RX on 6/30 and never filled it. Patient states she is having dizziness however this is not a new symptom. This is a chronic symptom.    Per review of the medical record patient underwent stent placementmid LAD for severe single vessel coronary artery disease on 04/08/2017 following NSTEMI.   OB History    Gravida Para Term Preterm AB Living   5 4 4   1 4    SAB TAB Ectopic Multiple Live Births   1       4      Past Medical History:  Diagnosis Date  . Anemia   . Bipolar 1 disorder ([place])   . Chest pain 04/12/2017  . Coronary artery disease 04/08/2017   A.Cath 04/08/17: DES to mid LAD, 50% stenosis of RCA  . Depression   . Hypertension   . Unstable angina [facility])     Past Surgical History:  Procedure Laterality Date  . CORONARY STENT INTERVENTION Right 04/08/2017   Procedure: Coronary Stent Intervention;  Surgeon: Sherren Mocha, MD;  Location: [place] [facility];  Service: Cardiovascular;  Laterality: Right;  . ECTOPIC PREGNANCY SURGERY    . FOOT SURGERY    . INTRAVASCULAR PRESSURE WIRE/FFR STUDY Right 04/08/2017   Procedure: Intravascular Pressure Wire/FFR Study;  Surgeon: Sherren Mocha, MD;  Location:  [place] [facility];  Service: Cardiovascular;  Laterality: Right;  . LEFT HEART CATH AND CORONARY ANGIOGRAPHY N/A 04/08/2017   Procedure: Left Heart Cath and Coronary Angiography;  Surgeon: Sherren Mocha, MD;  Location: [place] [facility];  Service: Cardiovascular;  Laterality: N/A;    Family History  Problem Relation Age of Onset  . Kidney cancer Mother   . Heart attack Brother        In 84s  . Drug abuse Brother   . Stroke Father     Social History  Substance Use Topics  . Smoking status: Current Every Day Smoker    Packs/day: 0.50    Types: Cigarettes  . Smokeless tobacco: Current User  . Alcohol use No    Allergies: No Known Allergies  Prescriptions Prior to Admission  Medication Sig Dispense Refill Last Dose  . albuterol (PROVENTIL HFA;VENTOLIN HFA) 108 (90 Base) MCG/ACT inhaler Inhale 2 puffs into the lungs every 6 (six) hours as needed for wheezing or shortness of breath. 1 Inhaler 2   . aspirin EC 81 MG tablet Take 1 tablet (81 mg total) by mouth daily. 30 tablet 0 Taking  . clopidogrel (PLAVIX) 75 MG tablet Take 1 tablet (75 mg total) by mouth daily. 30 tablet 0 Taking  . ferrous sulfate 325 (65 FE)  MG tablet Take 1 tablet (325 mg total) by mouth daily. 30 tablet 0 Taking  . furosemide (LASIX) 20 MG tablet Take 1 tablet (20 mg total) by mouth daily. 5 tablet 0 Taking  . guaiFENesin (MUCINEX) 600 MG 12 hr tablet Take 1 tablet (600 mg total) by mouth 2 (two) times daily. 30 tablet 0   . hydrOXYzine (ATARAX/VISTARIL) 25 MG tablet Take 1 tablet (25 mg total) by mouth 3 (three) times daily as needed. 90 tablet 1 Taking  . ibuprofen (ADVIL,MOTRIN) 200 MG tablet Take 400 mg by mouth every 6 (six) hours as needed for moderate pain.   Taking  . lisinopril (PRINIVIL,ZESTRIL) 5 MG tablet Take 1 tablet (5 mg total) by mouth daily. 30 tablet 3 Taking  . megestrol (MEGACE) 40 MG tablet Take 1 tablet (40 mg total) by mouth 2 (two) times daily. 14 tablet 0 Taking   Results for orders  placed or performed during the hospital encounter of 05/26/17 (from the past 48 hour(s))  Urinalysis, Routine w reflex microscopic     Status: Abnormal   Collection Time: 05/26/17  5:34 PM  Result Value Ref Range   Color, Urine STRAW (A) YELLOW   APPearance CLEAR CLEAR   Specific Gravity, Urine 1.013 1.005 - 1.030   pH 5.0 5.0 - 8.0   Glucose, UA NEGATIVE NEGATIVE mg/dL   Hgb urine dipstick SMALL (A) NEGATIVE   Bilirubin Urine NEGATIVE NEGATIVE   Ketones, ur NEGATIVE NEGATIVE mg/dL   Protein, ur NEGATIVE NEGATIVE mg/dL   Nitrite NEGATIVE NEGATIVE   Leukocytes, UA NEGATIVE NEGATIVE   RBC / HPF NONE SEEN 0 - 5 RBC/hpf   WBC, UA 0-5 0 - 5 WBC/hpf   Bacteria, UA RARE (A) NONE SEEN   Squamous Epithelial / LPF 0-5 (A) NONE SEEN   Mucous PRESENT   Pregnancy, urine POC     Status: None   Collection Time: 05/26/17  5:50 PM  Result Value Ref Range   Preg Test, Ur NEGATIVE NEGATIVE    Comment:        THE SENSITIVITY OF THIS METHODOLOGY IS >24 mIU/mL   CBC     Status: Abnormal   Collection Time: 05/26/17  6:10 PM  Result Value Ref Range   WBC 7.1 4.0 - 10.5 K/uL   RBC 3.63 (L) 3.87 - 5.11 MIL/uL   Hemoglobin 7.8 (L) 12.0 - 15.0 g/dL    Comment: REPEATED TO VERIFY   HCT 26.8 (L) 36.0 - 46.0 %   MCV 73.8 (L) 78.0 - 100.0 fL   MCH 21.5 (L) 26.0 - 34.0 pg   MCHC 29.1 (L) 30.0 - 36.0 g/dL   RDW 19.4 (H) 11.5 - 15.5 %   Platelets 313 150 - 400 K/uL   Review of Systems  Constitutional: Positive for fatigue.  Gastrointestinal: Positive for abdominal pain.  Genitourinary: Positive for vaginal bleeding.  Neurological: Positive for dizziness.   Physical Exam   Blood pressure (!) 152/84, pulse 79, temperature 98.6 F (37 C), temperature source Oral, resp. rate 18, last menstrual period 05/24/2017.  Physical Exam  Constitutional: She is oriented to person, place, and time. She appears well-developed and well-nourished.  Non-toxic appearance. She does not have a sickly appearance. She  does not appear ill. No distress.  HENT:  Head: Normocephalic.  Eyes: Pupils are equal, round, and reactive to light.  Cardiovascular: Normal rate and normal heart sounds.   Respiratory: Effort normal.  GI: Soft. Normal appearance. There is generalized tenderness. There is  no rigidity, no rebound and no guarding.  Genitourinary:  Genitourinary Comments: Vagina - Small amount of dark red blood, no odor  Cervix - No contact bleeding, no active bleeding  Bimanual exam: Cervix closed Uterus non tender, slightly enlarged  Adnexa non tender, no masses bilaterally Chaperone present for exam.   Musculoskeletal: Normal range of motion.  Neurological: She is alert and oriented to person, place, and time.  Skin: Skin is warm. She is not diaphoretic. No pallor.  Psychiatric: Her behavior is normal.   MAU Course  Procedures  None  MDM  Reviewed previous US in detail CBC Orthostatic vitals WNL, some tachycardia with change of positions only.  CBC on 7/6 hgb 8.0  Discussed patient with Dr. Glo Herring; Dr. Glo Herring to come to MAU to speak to patient 1930: patient complains of chest pain that radiates down both arms. EKG ordered.  Discussed patient with Dr. Rogene [facility] who accepts care of the patient for transfer to [facility] ED> patient aware and agreeable to plan of care.   EKG: NSR   Assessment and Plan   A:  1. Other forms of angina pectoris ([place])   2. Abnormal vaginal bleeding   3. Iron deficiency anemia due to chronic blood loss     P:  Transfer to [facility] ED for further evaluation of chest pain Patient is scheduled for an urgent follow up visit in the [facility] office at 10:00 am on 7/16. Patient made aware Bleeding precautions Return to MAU if bleeding worsens Encouraged patient to fill megace Continue Iron.    Noni Saupe I, NP 05/26/2017 7:59 PM

## 2017-05-26 NOTE — MAU Note (Signed)
Pt seen in MAU found currently to have minimal bleeding though pt has had heavy Abnormal uterine bleeding over the past year, with one episode requiring transfusion. Pt was given Megace Rx by PCMD but she did not get it, allegedly due to lack of funds. U/s from 7/9 shows a heterogenous uterus and probable secretory phase thickened endometrium. Pt will be on plavix likely long term, so suppression of endometrium will be needed. Pt has complained of chest discomfort, and in light of her recent stent placement, will require transfer to [facility] to address this. Pt will be given an expedited appt to gyn clinic here, for probable endometrial biopsy and consideration of treatment options such as IUD, or endometrial ablation.

## 2017-05-26 NOTE — ED Notes (Signed)
Pt with hx of anemia and [facility]ro[facility]. Pt seen at [facility] for vaginal bleeding x 2 days, noticed bleeding after outpatient vaginal [place] on Monday 7/9. Pt reports intermittent CP, hx of cath and stent placement May 2018. Per [facility], EKG unremarkable. Pt given 2 NTG which relieved CP fully. Pt reports R sided CP that radiates to L shoulder and L neck. 131/80, HR 70 bpm NSR. Pt c/o HA at this time after NTG administration. A&Ox4. 18 G L AC.

## 2017-05-26 NOTE — Telephone Encounter (Signed)
Pt called to see if the PCP can be prescribed Xanax for her, please follow up

## 2017-05-26 NOTE — ED Notes (Signed)
Pt transported to XR.  

## 2017-05-26 NOTE — MAU Note (Signed)
C/o heavy bleeding for past 4-5 days; has ovarian fibroids; on 04-09-17- placed had a stent placed in her heart; pt is a truck driver; desires to have surgery so that she can go back to work;

## 2017-05-26 NOTE — Telephone Encounter (Signed)
Pt was called and informed of lab results. 

## 2017-05-26 NOTE — Telephone Encounter (Signed)
Please inform patient that f/u visit with PCP to specifically discuss anxiety and treatment options is recommended

## 2017-05-27 ENCOUNTER — Encounter (HOSPITAL_COMMUNITY): Payer: Self-pay | Admitting: General Practice

## 2017-05-27 DIAGNOSIS — I2583 Coronary atherosclerosis due to lipid rich plaque: Secondary | ICD-10-CM | POA: Diagnosis not present

## 2017-05-27 DIAGNOSIS — I2510 Atherosclerotic heart disease of native coronary artery without angina pectoris: Secondary | ICD-10-CM | POA: Diagnosis not present

## 2017-05-27 DIAGNOSIS — R079 Chest pain, unspecified: Secondary | ICD-10-CM | POA: Diagnosis present

## 2017-05-27 DIAGNOSIS — I208 Other forms of angina pectoris: Secondary | ICD-10-CM | POA: Diagnosis not present

## 2017-05-27 DIAGNOSIS — I10 Essential (primary) hypertension: Secondary | ICD-10-CM

## 2017-05-27 DIAGNOSIS — N939 Abnormal uterine and vaginal bleeding, unspecified: Secondary | ICD-10-CM | POA: Diagnosis not present

## 2017-05-27 DIAGNOSIS — D500 Iron deficiency anemia secondary to blood loss (chronic): Secondary | ICD-10-CM | POA: Diagnosis not present

## 2017-05-27 LAB — PREPARE RBC (CROSSMATCH)

## 2017-05-27 LAB — ABO/RH: ABO/RH(D): O POS

## 2017-05-27 LAB — TROPONIN I: Troponin I: <0.03 ng/mL (ref <0.03)

## 2017-05-27 MED ORDER — FUROSEMIDE 20 MG PO TABS
20.0000 mg | ORAL_TABLET | Freq: Every day | ORAL | Status: DC
  Administered 2017-05-28: 20 mg via ORAL
  Filled 2017-05-27: qty 1

## 2017-05-27 MED ORDER — ASPIRIN EC 81 MG PO TBEC
81.0000 mg | DELAYED_RELEASE_TABLET | Freq: Every day | ORAL | Status: DC
  Administered 2017-05-27 – 2017-05-28 (×2): 81 mg via ORAL
  Filled 2017-05-27 (×2): qty 1

## 2017-05-27 MED ORDER — CYCLOBENZAPRINE HCL 10 MG PO TABS
10.0000 mg | ORAL_TABLET | Freq: Three times a day (TID) | ORAL | Status: DC | PRN
  Administered 2017-05-27 – 2017-05-28 (×2): 10 mg via ORAL
  Filled 2017-05-27 (×3): qty 1

## 2017-05-27 MED ORDER — ALBUTEROL SULFATE (2.5 MG/3ML) 0.083% IN NEBU
2.5000 mg | INHALATION_SOLUTION | Freq: Four times a day (QID) | RESPIRATORY_TRACT | Status: DC | PRN
  Filled 2017-05-27: qty 3

## 2017-05-27 MED ORDER — ALBUTEROL SULFATE (2.5 MG/3ML) 0.083% IN NEBU: 2.5000 mg | INHALATION_SOLUTION | Freq: Four times a day (QID) | RESPIRATORY_TRACT | Status: DC | PRN

## 2017-05-27 MED ORDER — ACETAMINOPHEN 325 MG PO TABS
650.0000 mg | ORAL_TABLET | ORAL | Status: DC | PRN
  Administered 2017-05-27 – 2017-05-28 (×2): 650 mg via ORAL
  Filled 2017-05-27 (×2): qty 2

## 2017-05-27 MED ORDER — ISOSORBIDE MONONITRATE ER 30 MG PO TB24
15.0000 mg | ORAL_TABLET | Freq: Every day | ORAL | Status: DC
  Administered 2017-05-28: 15 mg via ORAL
  Filled 2017-05-27: qty 1

## 2017-05-27 MED ORDER — NICOTINE 14 MG/24HR TD PT24
14.0000 mg | MEDICATED_PATCH | Freq: Every day | TRANSDERMAL | Status: DC
  Administered 2017-05-27 – 2017-05-28 (×2): 14 mg via TRANSDERMAL
  Filled 2017-05-27 (×2): qty 1

## 2017-05-27 MED ORDER — ATORVASTATIN CALCIUM 40 MG PO TABS
40.0000 mg | ORAL_TABLET | Freq: Every day | ORAL | Status: DC
  Administered 2017-05-28: 40 mg via ORAL
  Filled 2017-05-27: qty 1

## 2017-05-27 MED ORDER — METOPROLOL SUCCINATE ER 25 MG PO TB24
12.5000 mg | ORAL_TABLET | Freq: Every day | ORAL | Status: DC
  Administered 2017-05-28: 12.5 mg via ORAL
  Filled 2017-05-27: qty 1

## 2017-05-27 MED ORDER — FERROUS SULFATE 325 (65 FE) MG PO TABS
325.0000 mg | ORAL_TABLET | Freq: Every day | ORAL | Status: DC
Start: 2017-05-27 — End: 2017-05-27

## 2017-05-27 MED ORDER — FERROUS SULFATE 325 (65 FE) MG PO TABS
325.0000 mg | ORAL_TABLET | Freq: Two times a day (BID) | ORAL | Status: DC
  Filled 2017-05-27 (×3): qty 1

## 2017-05-27 MED ORDER — LISINOPRIL 5 MG PO TABS
5.0000 mg | ORAL_TABLET | Freq: Every day | ORAL | Status: DC
  Administered 2017-05-27 – 2017-05-28 (×2): 5 mg via ORAL
  Filled 2017-05-27 (×2): qty 1

## 2017-05-27 MED ORDER — ENOXAPARIN SODIUM 40 MG/0.4ML ~~LOC~~ SOLN: 40.0000 mg | SUBCUTANEOUS | Status: DC

## 2017-05-27 MED ORDER — SODIUM CHLORIDE 0.9 % IV SOLN: Freq: Once | INTRAVENOUS | Status: DC

## 2017-05-27 MED ORDER — CLOPIDOGREL BISULFATE 75 MG PO TABS
75.0000 mg | ORAL_TABLET | Freq: Every day | ORAL | Status: DC
  Administered 2017-05-27 – 2017-05-28 (×2): 75 mg via ORAL
  Filled 2017-05-27 (×2): qty 1

## 2017-05-27 MED ORDER — BUSPIRONE HCL 5 MG PO TABS
5.0000 mg | ORAL_TABLET | Freq: Two times a day (BID) | ORAL | Status: DC
  Administered 2017-05-27 – 2017-05-28 (×2): 5 mg via ORAL
  Filled 2017-05-27 (×2): qty 1

## 2017-05-27 MED ORDER — ZOLPIDEM TARTRATE 5 MG PO TABS
5.0000 mg | ORAL_TABLET | Freq: Every evening | ORAL | Status: DC | PRN
  Administered 2017-05-27: 5 mg via ORAL
  Filled 2017-05-27: qty 1

## 2017-05-27 MED ORDER — ONDANSETRON HCL 4 MG/2ML IJ SOLN: 4.0000 mg | Freq: Four times a day (QID) | INTRAMUSCULAR | Status: DC | PRN

## 2017-05-27 NOTE — Telephone Encounter (Signed)
Please inform patient that request will be addressed next week by PCP  Please inform her that xanax is not first line for insomnia First line options are melatonin, benadryl at low doses (this are OTC) Trazodone, Ambien  Her PCP will consider best treatment based on needs and medical conditions  She may need an appointment to specifically discuss insomnia

## 2017-05-27 NOTE — ED Notes (Addendum)
Patient refusing blood transfusion states she is refusing any transfusions until she gets her bleeding fibroids removed. Patient refusing to sign consents and states she is refusing iron pills as well. RN attempted to explain purpose of transfusion and iron and potential symptomatic relief. Patient becoming increasingly upset and states "I wish they would just use common sense and stop masking the problem and fixing the damn problem."  MD made aware. States cancelled blood transfusion orders earlier. Blood bank made aware. Will attempt to get patient to sign blood refusal paperwork.

## 2017-05-27 NOTE — Progress Notes (Signed)
CSW provided pt with a list of Shelters that pt can utilize if pt chooses to.    Virgie Dad Ashaun Gaughan, MSW, [facility] Clinical Social Worker [phone]

## 2017-05-27 NOTE — ED Notes (Signed)
Pt refusing to have blood transfusion, Dr. Jeneen Rinks notified.

## 2017-05-27 NOTE — ED Notes (Signed)
Patient refusing to sign blood transfusion refusal

## 2017-05-27 NOTE — H&P (Addendum)
History and Physical    Emma Turner QQI:[id number] DOB: [birth year] DOA: 05/26/2017  PCP: Arnoldo Morale, MD  Patient coming from: Home  I have personally briefly reviewed patient's old medical records in [place]  Chief Complaint: Chest pain  HPI: Emma Turner is a 51 y.o. female with medical history significant of CAD s/p DES to LAD in May.  Patient presents tot he ED with c/o chest pain.  Was originally seen at [facility] earlier today for vaginal bleeding which is ongoing.  HGB is slightly down at 7.8 from 8.0 a week ago.  CP onset at 9am this morning.  Radiates to L arm and L neck.  Improved with NTG.  Has been compliant with plavix she says.   ED Course: Trop neg, EKG unremarkable.   Review of Systems: As per HPI otherwise 10 point review of systems negative.   Past Medical History:  Diagnosis Date  . Anemia   . Bipolar 1 disorder ([place])   . Chest pain 04/12/2017  . Coronary artery disease 04/08/2017   A.Cath 04/08/17: DES to mid LAD, 50% stenosis of RCA  . Depression   . Hypertension   . Unstable angina [facility])     Past Surgical History:  Procedure Laterality Date  . CORONARY STENT INTERVENTION Right 04/08/2017   Procedure: Coronary Stent Intervention;  Surgeon: Sherren Mocha, MD;  Location: [place] [facility];  Service: Cardiovascular;  Laterality: Right;  . ECTOPIC PREGNANCY SURGERY    . FOOT SURGERY    . INTRAVASCULAR PRESSURE WIRE/FFR STUDY Right 04/08/2017   Procedure: Intravascular Pressure Wire/FFR Study;  Surgeon: Sherren Mocha, MD;  Location: [place] [facility];  Service: Cardiovascular;  Laterality: Right;  . LEFT HEART CATH AND CORONARY ANGIOGRAPHY N/A 04/08/2017   Procedure: Left Heart Cath and Coronary Angiography;  Surgeon: Sherren Mocha, MD;  Location: [place] [facility];  Service: Cardiovascular;  Laterality: N/A;     reports that she has been smoking Cigarettes.  She has been smoking about 0.50 packs per day. She uses smokeless tobacco. She reports  that she uses drugs, including Marijuana. She reports that she does not drink alcohol.  No Known Allergies  Family History  Problem Relation Age of Onset  . Kidney cancer Mother   . Heart attack Brother        In 51s  . Drug abuse Brother   . Stroke Father      Prior to Admission medications   Medication Sig Start Date End Date Taking? Authorizing Provider  albuterol (PROVENTIL HFA;VENTOLIN HFA) 108 (90 Base) MCG/ACT inhaler Inhale 2 puffs into the lungs every 6 (six) hours as needed for wheezing or shortness of breath. 05/21/17  Yes Arnoldo Morale, MD  aspirin EC 81 MG tablet Take 1 tablet (81 mg total) by mouth daily. 04/09/17 04/09/18 Yes Rosita Fire, MD  clopidogrel (PLAVIX) 75 MG tablet Take 1 tablet (75 mg total) by mouth daily. 04/18/17  Yes Carmin Muskrat, MD  ferrous sulfate 325 (65 FE) MG tablet Take 1 tablet (325 mg total) by mouth daily. 05/15/17  Yes Recardo Evangelist, PA-C  furosemide (LASIX) 20 MG tablet Take 1 tablet (20 mg total) by mouth daily. 05/09/17  Yes Drenda Freeze, MD  ibuprofen (ADVIL,MOTRIN) 200 MG tablet Take 400 mg by mouth every 6 (six) hours as needed for moderate pain.   Yes [provider]  lisinopril (PRINIVIL,ZESTRIL) 5 MG tablet Take 1 tablet (5 mg total) by mouth daily. 05/14/17 05/14/18 Yes Amao, Enobong,  MD  megestrol (MEGACE) 40 MG tablet Take 1 tablet (40 mg total) by mouth 2 (two) times daily. Patient not taking: Reported on 05/26/2017 05/15/17   Recardo Evangelist, PA-C    Physical Exam: Vitals:   05/26/17 2300 05/26/17 2315 05/26/17 2330 05/26/17 2345  BP: 131/74 122/79 123/86 (!) 142/78  Pulse: 73 72 70 71  Resp: 17 18 17 17   Temp:      TempSrc:      SpO2:   97%     Constitutional: NAD, calm, comfortable Eyes: PERRL, lids and conjunctivae normal ENMT: Mucous membranes are moist. Posterior pharynx clear of any exudate or lesions.Normal dentition.  Neck: normal, supple, no masses, no thyromegaly Respiratory: clear to  auscultation bilaterally, no wheezing, no crackles. Normal respiratory effort. No accessory muscle use.  Cardiovascular: Regular rate and rhythm, no murmurs / rubs / gallops. No extremity edema. 2+ pedal pulses. No carotid bruits.  Abdomen: no tenderness, no masses palpated. No hepatosplenomegaly. Bowel sounds positive.  Musculoskeletal: no clubbing / cyanosis. No joint deformity upper and lower extremities. Good ROM, no contractures. Normal muscle tone.  Skin: no rashes, lesions, ulcers. No induration Neurologic: CN 2-12 grossly intact. Sensation intact, DTR normal. Strength 5/5 in all 4.  Psychiatric: Normal judgment and insight. Alert and oriented x 3. Normal mood.    Labs on Admission: I have personally reviewed following labs and imaging studies  CBC:  Recent Labs Lab 05/21/17 1215 05/26/17 1810  WBC 6.3 7.1  NEUTROABS 4.6  --   HGB 8.0* 7.8*  HCT 27.9* 26.8*  MCV 73* 73.8*  PLT 325 423   Basic Metabolic Panel:  Recent Labs Lab 05/21/17 1215 05/26/17 2150  NA 142 138  K 4.2 3.5  CL 104 106  CO2 19* 24  GLUCOSE 98 120*  BUN 13 16  CREATININE 0.74 0.83  CALCIUM 8.7 8.6*   GFR: Estimated Creatinine Clearance: 94.2 mL/min (by C-G formula based on SCr of 0.83 mg/dL). Liver Function Tests: No results for input(s): AST, ALT, ALKPHOS, BILITOT, PROT, ALBUMIN in the last 168 hours. No results for input(s): LIPASE, AMYLASE in the last 168 hours. No results for input(s): AMMONIA in the last 168 hours. Coagulation Profile: No results for input(s): INR, PROTIME in the last 168 hours. Cardiac Enzymes: No results for input(s): CKTOTAL, CKMB, CKMBINDEX, TROPONINI in the last 168 hours. BNP (last 3 results)  Recent Labs  04/23/17 1052  PROBNP 76   HbA1C: No results for input(s): HGBA1C in the last 72 hours. CBG: No results for input(s): GLUCAP in the last 168 hours. Lipid Profile: No results for input(s): CHOL, HDL, LDLCALC, TRIG, CHOLHDL, LDLDIRECT in the last 72  hours. Thyroid Function Tests: No results for input(s): TSH, T4TOTAL, FREET4, T3FREE, THYROIDAB in the last 72 hours. Anemia Panel: No results for input(s): VITAMINB12, FOLATE, FERRITIN, TIBC, IRON, RETICCTPCT in the last 72 hours. Urine analysis:    Component Value Date/Time   COLORURINE STRAW (A) 05/26/2017 1734   APPEARANCEUR CLEAR 05/26/2017 1734   LABSPEC 1.013 05/26/2017 1734   PHURINE 5.0 05/26/2017 1734   GLUCOSEU NEGATIVE 05/26/2017 1734   HGBUR SMALL (A) 05/26/2017 1734   BILIRUBINUR NEGATIVE 05/26/2017 1734   KETONESUR NEGATIVE 05/26/2017 1734   PROTEINUR NEGATIVE 05/26/2017 1734   UROBILINOGEN 0.2 07/03/2012 1143   NITRITE NEGATIVE 05/26/2017 1734   LEUKOCYTESUR NEGATIVE 05/26/2017 1734    Radiological Exams on Admission: Dg Chest 2 View  Result Date: 05/26/2017 CLINICAL DATA:  Chest pain. EXAM: CHEST  2  VIEW COMPARISON:  Radiograph 05/09/2017.  CTA 04/22/2015 FINDINGS: The cardiomediastinal contours are unchanged with borderline mild cardiomegaly. Mild bronchial thickening. Pulmonary vasculature is normal. No consolidation, pleural effusion, or pneumothorax. No acute osseous abnormalities are seen. IMPRESSION: Chronic bronchial thickening. Stable borderline cardiomegaly. No acute abnormality. Electronically Signed   By: Jeb Levering M.D.   On: 05/26/2017 22:09    EKG: Independently reviewed.  Assessment/Plan Principal Problem:   Chest pain, rule out acute myocardial infarction Active Problems:   Essential hypertension   Iron deficiency anemia   Coronary artery disease    1. CP r/o - 1. CP obs pathway 2. Serial trops 3. Tele monitor 4. Call cards in AM 5. Continue plavix and ASA 2. HTN - continue home meds 3. Iron def anemia - Increase Iron to BID  DVT prophylaxis: SCDs Code Status: Full Family Communication: No family in room Disposition Plan: Home after admit Consults called: none Admission status: Place in obs   [place], [place]  [facility] Pager [phone]  If 7AM-7PM, please contact day team taking care of patient [email or web address] Password [email or web address]  05/27/2017, 1:02 AM

## 2017-05-27 NOTE — Consult Note (Signed)
Patient ID: Emma Turner MRN: [id number], DOB/AGE: 51-Apr-1967   Admit date: 05/26/2017  Reason for Consult: Chest Pain Requesting Physician: Dr. Eliseo Squires, Internal Medicine    Primary Physician: Arnoldo Morale, MD Primary Cardiologist: Dr. Radford Pax    Pt. Profile:  Emma Turner is a 51 y.o. female with a h/o CAD s/p recent NSTEMI 03/2017 with cath showing single vessel obstructive CAD inolving the mid LAD s/p PCI + DES, on DAPT with ASA and Plavix, plus HTN, tobacco use, OSA, Bipolar 1 disorder, homelessness and difficulties affording meds, being admitted for symptomatic acute blood loss anemia, 2/2 menorrhagia from uterine fibroids. Hgb 7.8. Cardiology consulted for the evaluation of chest pain,  at the request of Dr. Eliseo Squires, Internal Medicine.   Problem List  Past Medical History:  Diagnosis Date  . Anemia   . Bipolar 1 disorder ([place])   . Chest pain 04/12/2017  . Coronary artery disease 04/08/2017   A.Cath 04/08/17: DES to mid LAD, 50% stenosis of RCA  . Depression   . Hypertension   . Unstable angina [facility])     Past Surgical History:  Procedure Laterality Date  . CORONARY STENT INTERVENTION Right 04/08/2017   Procedure: Coronary Stent Intervention;  Surgeon: Sherren Mocha, MD;  Location: [place] [facility];  Service: Cardiovascular;  Laterality: Right;  . ECTOPIC PREGNANCY SURGERY    . FOOT SURGERY    . INTRAVASCULAR PRESSURE WIRE/FFR STUDY Right 04/08/2017   Procedure: Intravascular Pressure Wire/FFR Study;  Surgeon: Sherren Mocha, MD;  Location: [place] [facility];  Service: Cardiovascular;  Laterality: Right;  . LEFT HEART CATH AND CORONARY ANGIOGRAPHY N/A 04/08/2017   Procedure: Left Heart Cath and Coronary Angiography;  Surgeon: Sherren Mocha, MD;  Location: [place] [facility];  Service: Cardiovascular;  Laterality: N/A;     Allergies  No Known Allergies  HPI   Emma Turner is a 51 y/o female with a h/o CAD, HTN, tobacco use, OSA and Bipolar 1 disorder. She was recently  admitted to [facility] on 04/08/17 with chest pain and ruled in for NSTEMI with troponin peaking at 0.08. Subsequent LHC was performed by Dr. Burt Knack. She was found to have single vessel CAD involving the mid LAD, 80% stenosis, treated successfully with DES. There was moderate mid RCA stenosis, treated medically. The left main and LCx were both widely patent. LVEF was normal at 65%. She was initially placed on DAPT with ASA + Brilinta, however did not tolerate Brilinta due to dyspnea. She had recurrent CP and presented back to the hospital on 04/13/17. She was admitted and cardiac enzymes were cycled but were negative, ruling out acute instent thrombosis. There were also no EKG changes. There were no indications for repeat [facility]. Her CP was felt atypical. She was switched from Brilinta to Plavix. ASA continued.   She was reassessed on 04/23/17 for post hospital f/u. She denied CP but noted mild exertional dyspnea and new complaint of bilateral LEE, however pro BNP was normal at 76. Her diastolic BP was elevated in the 90s. She was instructed to continue HCTZ, elevate legs and avoid sodium. Beta blocker therapy with metoprolol was also added 12.5 mg BID given her CAD and HTN as well as Lipitor 40 mg (LDL during hospitalization was 87 mg/dL).   She has had issues with medication compliance and a lot of anxiety given her social situation. 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 She has no income at the moment. She lost  her home and is sleeping in her car. She has been unable to afford all of her meds, but somehow finds a way to get cigarettes. She states she has no family to help her. However during her office visits, she reported full compliance with ASA and Plavix. She has not been able to afford metoprolol, Lipitor and Prilosec. Her PCP is at [facility] and she was instructed to seek assistance with medications there. There has also been concerns regarding OSA and has a sleep study ordered in August.    She presents to the ED with complaint of chest pain, in the setting of anemia 2/2 heavy vaginal bleeding. She reports h/o uterine fibroids.  Hgb is down to 7.8, down from 10.7 1 month ago. Pt reports this has been as low as 4 in the past. Troponins are negative x 3. She has been admitted by IM. Per documention, she has refused transfusions.   She is currently CP free and rest but notes mild exertional dyspnea. EKG nonischemic. Telemetry NSR. BP stable.    Home Medications  Prior to Admission medications   Medication Sig Start Date End Date Taking? Authorizing Provider  albuterol (PROVENTIL HFA;VENTOLIN HFA) 108 (90 Base) MCG/ACT inhaler Inhale 2 puffs into the lungs every 6 (six) hours as needed for wheezing or shortness of breath. 05/21/17  Yes Arnoldo Morale, MD  aspirin EC 81 MG tablet Take 1 tablet (81 mg total) by mouth daily. 04/09/17 04/09/18 Yes Rosita Fire, MD  clopidogrel (PLAVIX) 75 MG tablet Take 1 tablet (75 mg total) by mouth daily. 04/18/17  Yes Carmin Muskrat, MD  ferrous sulfate 325 (65 FE) MG tablet Take 1 tablet (325 mg total) by mouth daily. 05/15/17  Yes Recardo Evangelist, PA-C  furosemide (LASIX) 20 MG tablet Take 1 tablet (20 mg total) by mouth daily. 05/09/17  Yes Drenda Freeze, MD  ibuprofen (ADVIL,MOTRIN) 200 MG tablet Take 400 mg by mouth every 6 (six) hours as needed for moderate pain.   Yes [provider]  lisinopril (PRINIVIL,ZESTRIL) 5 MG tablet Take 1 tablet (5 mg total) by mouth daily. 05/14/17 05/14/18 Yes Arnoldo Morale, MD  megestrol (MEGACE) 40 MG tablet Take 1 tablet (40 mg total) by mouth 2 (two) times daily. Patient not taking: Reported on 05/26/2017 05/15/17   Recardo Evangelist, [facility] Medications  . aspirin EC  81 mg Oral Daily  . clopidogrel  75 mg Oral Daily  . ferrous sulfate  325 mg Oral BID WC  . furosemide  20 mg Oral Daily  . lisinopril  5 mg Oral Daily    acetaminophen, albuterol, ondansetron (ZOFRAN)  IV  Family History  Family History  Problem Relation Age of Onset  . Kidney cancer Mother   . Heart attack Brother        In 98s  . Drug abuse Brother   . Stroke Father     Social History  Social History   Social History  . Marital status: Divorced    Spouse name: N/A  . Number of children: N/A  . Years of education: N/A   Occupational History  . truck driver    Social History Main Topics  . Smoking status: Current Every Day Smoker    Packs/day: 0.50    Types: Cigarettes  . Smokeless tobacco: Current User  . Alcohol use No  . Drug use: Yes    Types: Marijuana     Comment: Consuming daily; has not used in 1  month   . Sexual activity: Yes    Birth control/ protection: None   Other Topics Concern  . Not on file   Social History Narrative  . No narrative on file     Review of Systems General:  No chills, fever, night sweats or weight changes.  Cardiovascular:  No chest pain, dyspnea on exertion, edema, orthopnea, palpitations, paroxysmal nocturnal dyspnea. Dermatological: No rash, lesions/masses Respiratory: No cough, dyspnea Urologic: No hematuria, dysuria Abdominal:   No nausea, vomiting, diarrhea, bright red blood per rectum, melena, or hematemesis Neurologic:  No visual changes, wkns, changes in mental status. All other systems reviewed and are otherwise negative except as noted above.  Physical Exam  Blood pressure 140/75, pulse 79, temperature 99.3 F (37.4 C), temperature source Oral, resp. rate 18, weight 224 lb 14.4 oz (102 kg), last menstrual period 05/24/2017, SpO2 100 %.  General: Pleasant, NAD, moderately obese  Psych: Normal affect. Neuro: Alert and oriented X 3. Moves all extremities spontaneously. HEENT: Normal  Neck: Supple without bruits or JVD. Lungs:  Bilateral inspiratory wheezing at the bases. Heart: RRR no s3, s4, or murmurs. Abdomen: Soft, non-tender, non-distended, BS + x 4.  Extremities: No clubbing or cyanosis. Trace bilateral  pedal edema DP/PT/Radials 2+ and equal bilaterally.  Labs  Troponin [facility] Test)  Recent Labs  05/26/17 2153  TROPIPOC 0.00    Recent Labs  05/27/17 0230 05/27/17 0420 05/27/17 0754  TROPONINI <0.03 <0.03 <0.03   Lab Results  Component Value Date   WBC 7.1 05/26/2017   HGB 7.8 (L) 05/26/2017   HCT 26.8 (L) 05/26/2017   MCV 73.8 (L) 05/26/2017   PLT 313 05/26/2017     Recent Labs Lab 05/26/17 2150  NA 138  K 3.5  CL 106  CO2 24  BUN 16  CREATININE 0.83  CALCIUM 8.6*  GLUCOSE 120*   Lab Results  Component Value Date   CHOL 161 04/12/2017   HDL 34 (L) 04/12/2017   LDLCALC 87 04/12/2017   TRIG 198 (H) 04/12/2017   Lab Results  Component Value Date   DDIMER 1.05 (H) 04/20/2017     Radiology/Studies  Dg Chest 2 View  Result Date: 05/26/2017 CLINICAL DATA:  Chest pain. EXAM: CHEST  2 VIEW COMPARISON:  Radiograph 05/09/2017.  CTA 04/22/2015 FINDINGS: The cardiomediastinal contours are unchanged with borderline mild cardiomegaly. Mild bronchial thickening. Pulmonary vasculature is normal. No consolidation, pleural effusion, or pneumothorax. No acute osseous abnormalities are seen. IMPRESSION: Chronic bronchial thickening. Stable borderline cardiomegaly. No acute abnormality. Electronically Signed   By: Jeb Levering M.D.   On: 05/26/2017 22:09   Dg Chest 2 View  Result Date: 05/09/2017 CLINICAL DATA:  Chest pain since coronary artery stent placement Apr 08, 2017. History of hypertension. EXAM: CHEST  2 VIEW COMPARISON:  CT chest April 21, 2017 FINDINGS: Cardiac silhouette is mildly enlarged, mediastinal silhouette is unremarkable. No pleural effusion or focal consolidation. No pneumothorax. Accentuated thoracolumbar kyphosis associated with degenerative disc. Soft tissue planes are unremarkable. IMPRESSION: Mild cardiomegaly, no acute pulmonary process. Electronically Signed   By: Elon Alas M.D.   On: 05/09/2017 03:23   US Transvaginal  Non-ob  Result Date: 05/24/2017 CLINICAL DATA:  51 year old female for with menorrhagia and passage of clots. EXAM: TRANSABDOMINAL AND TRANSVAGINAL ULTRASOUND OF PELVIS TECHNIQUE: Both transabdominal and transvaginal ultrasound examinations of the pelvis were performed. Transabdominal technique was performed for global imaging of the pelvis including uterus, ovaries, adnexal regions, and pelvic cul-de-sac. It was necessary  to proceed with endovaginal exam following the transabdominal exam to visualize the endometrium and the ovaries. COMPARISON:  None FINDINGS: Uterus Measurements: 10.3 x 6.3 x 7.3 cm. The uterus is heterogeneous. There is a 11 x 10 x 9 mm posterior body intramural fibroid and a 9 x 10 x 6 mm lower uterine intramural fibroid. Endometrium Thickness: 16 mm. The endometrium is thickened and somewhat heterogeneous. No abnormal focal endometrial vascularity. Per history the patient is NOT postmenopausal and the endometrial thickness may be related to secretory phase of the menstrual cycle or related to small amount of blood product within the endometrium. However if the patient is postmenopausal or continues to become postmenopausal, this thickness of the endometrium is abnormal and further evaluation with hysteroscopy is warranted. Right ovary Measurements: 2.7 x 1.6 x 1.6 cm. There is a 1.5 x 1.2 x 1.1 cm cyst in the right ovary. Left ovary Measurements: 2.7 x 1.5 x 1.2 cm. Normal appearance/no adnexal mass. Other findings No abnormal free fluid. IMPRESSION: 1. Heterogeneous uterus with small intramural fibroids. 2. Thickened endometrium may represent secretory phase menstrual cycle or related to a small amount of blood product within the endometrium. Clinical correlation and follow-up with ultrasound recommended. If the patient is postmenopausal, further evaluation with hysteroscopy is recommended. 3. Unremarkable ovaries. Electronically Signed   By: Anner Crete M.D.   On: 05/24/2017 23:26    US Pelvis Complete  Result Date: 05/24/2017 CLINICAL DATA:  51 year old female for with menorrhagia and passage of clots. EXAM: TRANSABDOMINAL AND TRANSVAGINAL ULTRASOUND OF PELVIS TECHNIQUE: Both transabdominal and transvaginal ultrasound examinations of the pelvis were performed. Transabdominal technique was performed for global imaging of the pelvis including uterus, ovaries, adnexal regions, and pelvic cul-de-sac. It was necessary to proceed with endovaginal exam following the transabdominal exam to visualize the endometrium and the ovaries. COMPARISON:  None FINDINGS: Uterus Measurements: 10.3 x 6.3 x 7.3 cm. The uterus is heterogeneous. There is a 11 x 10 x 9 mm posterior body intramural fibroid and a 9 x 10 x 6 mm lower uterine intramural fibroid. Endometrium Thickness: 16 mm. The endometrium is thickened and somewhat heterogeneous. No abnormal focal endometrial vascularity. Per history the patient is NOT postmenopausal and the endometrial thickness may be related to secretory phase of the menstrual cycle or related to small amount of blood product within the endometrium. However if the patient is postmenopausal or continues to become postmenopausal, this thickness of the endometrium is abnormal and further evaluation with hysteroscopy is warranted. Right ovary Measurements: 2.7 x 1.6 x 1.6 cm. There is a 1.5 x 1.2 x 1.1 cm cyst in the right ovary. Left ovary Measurements: 2.7 x 1.5 x 1.2 cm. Normal appearance/no adnexal mass. Other findings No abnormal free fluid. IMPRESSION: 1. Heterogeneous uterus with small intramural fibroids. 2. Thickened endometrium may represent secretory phase menstrual cycle or related to a small amount of blood product within the endometrium. Clinical correlation and follow-up with ultrasound recommended. If the patient is postmenopausal, further evaluation with hysteroscopy is recommended. 3. Unremarkable ovaries. Electronically Signed   By: Anner Crete M.D.   On:  05/24/2017 23:26    ECG  NSR. No ischemia-- personally reviewed  Telemetry  NSR -- personally reviewed    ASSESSMENT AND PLAN  Emma Turner is a 51 y.o. female with a h/o CAD s/p recent NSTEMI 03/2017 with cath showing single vessel obstructive CAD inolving the mid LAD s/p PCI + DES, on DAPT with ASA and Plavix, plus HTN, tobacco use, OSA,  Bipolar 1 disorder, homelessness and difficulties affording meds, being admitted for symptomatic acute blood loss anemia, 2/2 menorrhagia from uterine fibroids. Hgb 7.8. Cardiology consulted for the evaluation of chest pain,  at the request of Dr. Eliseo Squires, Internal Medicine.    1. CAD: s/p NSTEMI 03/2017 with cath showing single vessel obstructive CAD inolving the mid LAD s/p PCI + DES, with residual 50% RCA disease, treated medically. Her anemia is likely contributing to her recent mild exertional CP and dyspnea. EKG is nonischemic and cardiac enzymes are negative x 3. She denies any resting symptoms. VSS. Given her underlying CAD and degree of anemia, transfusion has been recommended but patient refuses at this time. No religious reason's. Pt notes she thinks the idea of blood transfusions is "gross". Poor insight. Will further discuss with patient and encourage compliance with recommendations. There is also the ? Regarding duration of DAPT given her bleeding. Her stent was placed 04/08/17. It is a RESOLUTE ONYX 2.5X22 drug eluting stent. Initial recs were for DAPT x 12 months. Given her anemia ? If we can shorten duration of DAPT after 3 months. Will defer to MD. Pt was also instructed at last office visit to start metoprolol and Lipitor for CAD but never got Rx filled. Recommend metoprolol 12.5 mg BID, initially, and Lipitor 40 mg (LDL 5/28 was 87 mg/dL).  Smoking cessation encouraged.    2. Symptomatic anemia 2/2 to menorrhagia from uterine fibroids: Hgb dropped from 10>>7.8 over the course of 1 month. Pt reports that levels in the past have been as low as 4.  treatment with DAPT for recently placed coronary stent likely exacerbating her bleeding. IM managing her anemia. Transfusion recommended. Gynecology to weigh in regarding intervention. Pt want's partial hysterectomy.    Signed, Lyda Jester, PA-C, MHS 05/27/2017, 4:58 PM [facility] Pager: [phone]

## 2017-05-27 NOTE — Clinical Social Work Note (Signed)
Clinical Social Work Assessment  Patient Details  Name: Emma Turner MRN: [id number] Date of Birth: 05/17/1966  Date of referral:  05/27/17               Reason for consult:  Housing Concerns/Homelessness                Permission sought to share information with:  Other (CSW recieved information from pt, and pt denies having any other family. ) Permission granted to share information::     Name::        Agency::     Relationship::     Contact Information:     Housing/Transportation Living arrangements for the past 2 months:  Homeless Source of Information:  Patient Patient Interpreter Needed:  None Criminal Activity/Legal Involvement Pertinent to Current Situation/Hospitalization:  No - Comment as needed Significant Relationships:  (S) None, Other(Comment) (Pt informed CSW that pt has no supports. ) Lives with:  Self Do you feel safe going back to the place where you live?  No Need for family participation in patient care:  No (Coment) (no family per pt. )  Care giving concerns:  CSW spoke with pt at bedside where pt informed CSW that she is currently homeless and out of work. Pt mentions that pt has been sleeping in the car at a truck stop, and is starting to feel unsafe due to having to sleep with the windows down as it is hot outside.    Social Worker assessment / plan:  CSW spoke with pt at bedside. During the conversation pt was very tearful and stated "if its my time to go then let me go". CSW assessed pt and asked if pt was experiencing any depression or anxiety and pt admits that pt is. CSW also question if pt was having thoughts of SI and pt states "maybe, maybe so". CSW asked pt if pt had a plan for this and pt does not at this time. Pt expressed concerns regarding pt health and the hardships that pt has been dealing with over this past year. Pt informed CSW that pt contacted the [facility] and was told that they didn't have any space for her. This was very hurtful  per pt as pt really wanted to say there and get help. CSW suggested that pt call [facility] and see if they have beds available. Pt was not agreeable to that as pt states "they have bed bugs and I am not staying anywhere with bed bugs". Pt is seeking help for homeless issues as well as medial needs.   Pt feels as if the government or any other agency doesn't care and only wants pt to leave when pt arrives.   Employment status:  Other (Comment) (on [employer] but could potentially be loosing job. ) Forensic scientist:  Managed Medicare PT Recommendations:  Not assessed at this time Information / Referral to community resources:  Shelter  Patient/Family's Response to care:  Pt was agreeable to a shelter list and other resources that CSW can provide pt with.   Patient/Family's Understanding of and Emotional Response to Diagnosis, Current Treatment, and Prognosis:  Pt was understanding of everything that is taking place at this time. Pt wants help and resources as pt is afraid that pt's car is about to be taken away.  Emotional Assessment Appearance:  Appears stated age Attitude/Demeanor/Rapport:  Angry, Crying, Hostile, Grieving Affect (typically observed):  Angry, Anxious, Overwhelmed, Sad Orientation:  Oriented to Self, Oriented to Place,  Oriented to  Time, Oriented to Situation Alcohol / Substance use:  Not Applicable Psych involvement (Current and /or in the community):  No (Comment)  Discharge Needs  Concerns to be addressed:  Homelessness Readmission within the last 30 days:  No Current discharge risk:  Homeless, Inadequate Financial Supports Barriers to Discharge:  Homeless with medical needs   Wetzel Bjornstad, [place] 05/27/2017, 11:39 AM

## 2017-05-27 NOTE — ED Notes (Signed)
Pt ambulated to bathroom independently, denied dizziness/weakness

## 2017-05-27 NOTE — Progress Notes (Signed)
Patient admitted after midnight.  Has on-going vaginal bleeding and will need intervention-- has appointment on Monday for decision on intervention.  Hgb lower than previous so may be contributing to chest pain.  Refused transfusion.  To complicate matters patient is homeless.  Says she has been complaint with plavix at home (has not had yesterday and today).  Eulogio Bear DO

## 2017-05-28 DIAGNOSIS — I2583 Coronary atherosclerosis due to lipid rich plaque: Secondary | ICD-10-CM

## 2017-05-28 DIAGNOSIS — R079 Chest pain, unspecified: Secondary | ICD-10-CM

## 2017-05-28 DIAGNOSIS — D500 Iron deficiency anemia secondary to blood loss (chronic): Secondary | ICD-10-CM

## 2017-05-28 DIAGNOSIS — N939 Abnormal uterine and vaginal bleeding, unspecified: Secondary | ICD-10-CM | POA: Diagnosis not present

## 2017-05-28 DIAGNOSIS — I2510 Atherosclerotic heart disease of native coronary artery without angina pectoris: Secondary | ICD-10-CM | POA: Diagnosis not present

## 2017-05-28 MED ORDER — ATORVASTATIN CALCIUM 40 MG PO TABS: 40.0000 mg | ORAL_TABLET | Freq: Every day | ORAL | 0 refills | Status: DC

## 2017-05-28 MED ORDER — BUSPIRONE HCL 5 MG PO TABS: 5.0000 mg | ORAL_TABLET | Freq: Two times a day (BID) | ORAL | 0 refills | Status: DC

## 2017-05-28 MED ORDER — METOPROLOL SUCCINATE ER 25 MG PO TB24: 12.5000 mg | ORAL_TABLET | Freq: Every day | ORAL | 0 refills | Status: DC

## 2017-05-28 MED ORDER — ISOSORBIDE MONONITRATE ER 30 MG PO TB24: 15.0000 mg | ORAL_TABLET | Freq: Every day | ORAL | 0 refills | Status: DC

## 2017-05-28 NOTE — Hospital Discharge Follow-Up (Signed)
Transitional Care Clinic Care Coordination Note:  Admit date:  05/26/2017 Discharge date: 05/28/2017 Discharge Disposition: homeless/ to stay in her car Patient contact: # [phone] Emergency contact(s): no numbers provided  This Case Manager reviewed patient's EMR and determined patient would benefit from post-discharge medical management and chronic care management services through the [facility]. Patient has a history of CAD, HTN, unstable angina, bipolar,PTSD and on -going vaginal bleeding, uterine fibroids.  She was admitted with chest pain - ruled out MI and has 5 ED visits and 3 hospital admissions in the last 6 months. This Case Manager met with patient to discuss the services and medical management that can be provided at the [facility]. Patient verbalized understanding and agreed to receive post-discharge care at the [facility]   Patient scheduled for Transitional Care appointment on 7/17/187 @ [facility] information and appointment time provided to patient. Appointment information also placed on AVS.  Assessment:       Home Environment: currently homeless. 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 She does not have any income at this time because she is not working. She had been living in her truck and had stayed with her aunt but said that she can't return to her aunt's house because her aunt will not accept her back. She can't go back to her truck. She also noted that she has a daughter in the area but she refuses to stay with her. She does not want to go to a shelter at this time. She plans to stay in her car that is currently at [facility]. She said that she can sleep and take a shower at the truck stop in [place] [facility] [place] [facility] [place] but she does not have any gas in her car.         Support System: she said that she has no support at this time.        Level of functioning:independent       Home  DME:  None at present time        Home care services: (services arranged prior to discharge or new services after discharge) none        Transportation: has a car. Jacqlyn Krauss, RN CM notified that the patient only has $5 to her name and does not have enough money for gas for her car.         Food/Nutrition: (ability to afford, access, use of any community resources): receives $73/month in food stamps. Provided her with the booklets for free meals in [place] as well as Food Pantries in the area.         Medications: (ability to afford, access, compliance, Pharmacy used): she uses [facility] and understands that she can put her charges on an account that she can pay off when she is able to afford it.         Identified Barriers: homeless, no income, no support in the community, difficulty affording medications, has a car - but needs money for gas.         PCP (Name, office location, phone number): Dr Jarold Song  She said that she needs to have surgery for fibroids so that she can go back to work. She said that she thinks her current medical disability is due to anxiety from work. She is also concerned that her car may be repossessed due to her inability to make her next payment.   She said that  she has spoken to Christa See, [place].             Arranged services communicated to Jacqlyn Krauss, RN CM

## 2017-05-28 NOTE — Plan of Care (Signed)
Problem: Safety: Goal: Ability to remain free from injury will improve Outcome: Completed/Met Date Met: 05/28/17 Ambulates and performs ADLs independently without difficulty.  Problem: Pain Managment: Goal: General experience of comfort will improve Outcome: Progressing Oral pain medications effective.  Problem: Physical Regulation: Goal: Ability to maintain clinical measurements within normal limits will improve Outcome: Progressing VSS

## 2017-05-28 NOTE — Discharge Instructions (Signed)

## 2017-05-28 NOTE — Progress Notes (Signed)
CSW met with patient at bedside and discussed patient's living situation. Patient was calm, though with mostly flat affect. 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 Patient has reached out to shelter resources provided by ED social worker and declined to receive any additional resources.   Patient is concerned about losing her car, which she has been living out of, as she cannot make the payments on it. Patient indicated she is familiar with [employer]. Patient expressed anger with systems that have made it difficult to access resources. Patient stated she sometimes thinks about killing herself because she feels discouraged and sad but states she "does not have the courage to do it" and denied any plan to harm herself.   CSW validated patient's frustration with social service and governmental systems and lack of access to resources. CSW reflected on patient's strength and resiliency in continuing to seek resources through the challenge of medical issues. CSW assessed patient's protective factors. Patient indicated she does not have social support; her daughter lives in [place] but she is not close with her. Patient reported she likes to help other people and maintains resolve by remembering that even when things are difficult, other people are often struggling even more. CSW offered hope for resolution of patient's challenges.   CSW signing off.  Estanislado Emms, [place]

## 2017-05-28 NOTE — Care Management Note (Signed)
Case Management Note  Patient Details  Name: Emma Turner MRN: [id number] Date of Birth: December 20, 1965  Subjective/Objective:  Pt presented for Chest Pain. Pt is homeless and living in her car. 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 Pt has PCP at the [facility] and [place] scheduled via Opal Sidles with [facility] for MD [place]. Pt will be able to utilize the pharmacy onsite. Opal Sidles to provide information to Food Pantries for meals.                    Action/Plan: CSW did provide additional resources for patient as well. No further needs from CM at this time.    Expected Discharge Date:  05/28/17               Expected Discharge Plan:  Home/Self Care  In-House Referral:  Clinical Social Work, Nutrition  Discharge planning Services  CM Consult, Follow-up appt scheduled, [facility], Medication Assistance  Post Acute Care Choice:  NA Choice offered to:  NA  DME Arranged:  N/A DME Agency:  NA  HH Arranged:  NA HH Agency:  NA  Status of Service:  Completed, signed off  If discussed at [place] of Stay Meetings, dates discussed:    Additional Comments:  Bethena Roys, RN 05/28/2017, 3:08 PM

## 2017-05-28 NOTE — Discharge Summary (Signed)
Physician Discharge Summary  Mehak Roskelley FWY:[id number] DOB: 1966/02/22 DOA: 05/26/2017  PCP: Arnoldo Morale, MD  Admit date: 05/26/2017 Discharge date: 05/29/2017  Admitted From: Home Disposition: Home   Recommendations for Outpatient Follow-up:  1. Follow up with PCP in 1-2 weeks. Has transition care appointment per CM on 7/17 at 9:45am. 2. Follow up with cardiology as previously scheduled. Added antianginals and lipitor as below.  3. Obtain CBC at follow up to monitor blood loss anemia. Hgb 7.8 and pt menstruating but refused transfusion and megace. Increased iron.  4. Follow up in women's clinic to discuss options for heavy menstrual bleeding. Pt has declined IUD and ablation in the past, insisting on hysterectomy.  Home Health: None Equipment/Devices: None Discharge Condition: Stable CODE STATUS: Full Diet recommendation: Heart healthy  Brief/Interim Summary: Emma Turner is a 51 y.o. female with medical history significant of CAD s/p DES to LAD in May.  Patient presents tot he ED with c/o chest pain. She was originally seen at [facility] for vaginal bleeding which is ongoing in the setting of dual antiplatelet therapy following PCI. HGB is slightly down at 7.8 from 8.0 a week ago. Reported chest pain radiating to left arm and neck improved with NTG. She was admitted for ACS rule out for chest pain thought to be related to anemia, though she declined transfusion. Cardiology reported no ischemic evaluation was necessary. She was discharged after continuing to refuse any interventions at all (e.g. IUD, megace, transfusion of blood or iron). Case was discussed with Dr. Roselie Awkward, OB/GYN, who will have [facility] contact patient to schedule appointment to discuss further management.   Discharge Diagnoses:  Principal Problem:   Chest pain, rule out acute myocardial infarction Active Problems:   Essential hypertension   Iron deficiency anemia   Coronary artery disease   Abnormal vaginal  bleeding  Discharge Instructions Discharge Instructions    Discharge instructions    Complete by:  As directed    You were admitted for anemia and chest pain. You have declined transfusions and do not require any further work up for your chest pain. The next step is to follow up with the [facility] Clinic at [facility] to discuss hysterectomy. You are stable for discharge with the following recommendations:  - Continue taking medications as below. Metoprolol, imdur, and lipitor have been added to help with chest pain. You should take these daily and follow up with cardiology as scheduled.  - You will be contacted to follow up with the [facility]. I've spoken with Dr. Roselie Awkward who will alert their schedulers and call you. If you don't hear from them soon, call the number below.  - If your bleeding doesn't stop, you develop more chest pain or trouble breathing, seek medical attention.     Allergies as of 05/28/2017   No Known Allergies     Medication List    STOP taking these medications   ibuprofen 200 MG tablet Commonly known as:  ADVIL,MOTRIN   megestrol 40 MG tablet Commonly known as:  MEGACE     TAKE these medications   albuterol 108 (90 Base) MCG/ACT inhaler Commonly known as:  PROVENTIL HFA;VENTOLIN HFA Inhale 2 puffs into the lungs every 6 (six) hours as needed for wheezing or shortness of breath.   aspirin EC 81 MG tablet Take 1 tablet (81 mg total) by mouth daily.   atorvastatin 40 MG tablet Commonly known as:  LIPITOR Take 1 tablet (40 mg total) by mouth daily at 6 PM.  busPIRone 5 MG tablet Commonly known as:  BUSPAR Take 1 tablet (5 mg total) by mouth 2 (two) times daily.   clopidogrel 75 MG tablet Commonly known as:  PLAVIX Take 1 tablet (75 mg total) by mouth daily.   ferrous sulfate 325 (65 FE) MG tablet Take 1 tablet (325 mg total) by mouth daily.   furosemide 20 MG tablet Commonly known as:  LASIX Take 1 tablet (20 mg total) by mouth daily.    isosorbide mononitrate 30 MG 24 hr tablet Commonly known as:  IMDUR Take 0.5 tablets (15 mg total) by mouth daily.   lisinopril 5 MG tablet Commonly known as:  PRINIVIL,ZESTRIL Take 1 tablet (5 mg total) by mouth daily.   metoprolol succinate 25 MG 24 hr tablet Commonly known as:  TOPROL-XL Take 0.5 tablets (12.5 mg total) by mouth daily.      Follow-up Information    Arnoldo Morale, MD Follow up on 06/01/2017.   Specialty:  Family Medicine Why:  @ 0945 for Hospital Follow Up. Pt will be able to utilize Pharmacy at this location as well.  Contact information: [place] [address] [phone]        [place] Follow up.   Contact information: [place] [phone]         No Known Allergies  Consultations:  Cardiology, Dr. Radford Pax  Procedures/Studies: Dg Chest 2 View  Result Date: 05/26/2017 CLINICAL DATA:  Chest pain. EXAM: CHEST  2 VIEW COMPARISON:  Radiograph 05/09/2017.  CTA 04/22/2015 FINDINGS: The cardiomediastinal contours are unchanged with borderline mild cardiomegaly. Mild bronchial thickening. Pulmonary vasculature is normal. No consolidation, pleural effusion, or pneumothorax. No acute osseous abnormalities are seen. IMPRESSION: Chronic bronchial thickening. Stable borderline cardiomegaly. No acute abnormality. Electronically Signed   By: Jeb Levering M.D.   On: 05/26/2017 22:09   Dg Chest 2 View  Result Date: 05/09/2017 CLINICAL DATA:  Chest pain since coronary artery stent placement Apr 08, 2017. History of hypertension. EXAM: CHEST  2 VIEW COMPARISON:  CT chest April 21, 2017 FINDINGS: Cardiac silhouette is mildly enlarged, mediastinal silhouette is unremarkable. No pleural effusion or focal consolidation. No pneumothorax. Accentuated thoracolumbar kyphosis associated with degenerative disc. Soft tissue planes are unremarkable. IMPRESSION: Mild cardiomegaly, no acute  pulmonary process. Electronically Signed   By: Elon Alas M.D.   On: 05/09/2017 03:23   US Transvaginal Non-ob  Result Date: 05/24/2017 CLINICAL DATA:  51 year old female for with menorrhagia and passage of clots. EXAM: TRANSABDOMINAL AND TRANSVAGINAL ULTRASOUND OF PELVIS TECHNIQUE: Both transabdominal and transvaginal ultrasound examinations of the pelvis were performed. Transabdominal technique was performed for global imaging of the pelvis including uterus, ovaries, adnexal regions, and pelvic cul-de-sac. It was necessary to proceed with endovaginal exam following the transabdominal exam to visualize the endometrium and the ovaries. COMPARISON:  None FINDINGS: Uterus Measurements: 10.3 x 6.3 x 7.3 cm. The uterus is heterogeneous. There is a 11 x 10 x 9 mm posterior body intramural fibroid and a 9 x 10 x 6 mm lower uterine intramural fibroid. Endometrium Thickness: 16 mm. The endometrium is thickened and somewhat heterogeneous. No abnormal focal endometrial vascularity. Per history the patient is NOT postmenopausal and the endometrial thickness may be related to secretory phase of the menstrual cycle or related to small amount of blood product within the endometrium. However if the patient is postmenopausal or continues to become postmenopausal, this thickness of the endometrium is abnormal and further evaluation with  hysteroscopy is warranted. Right ovary Measurements: 2.7 x 1.6 x 1.6 cm. There is a 1.5 x 1.2 x 1.1 cm cyst in the right ovary. Left ovary Measurements: 2.7 x 1.5 x 1.2 cm. Normal appearance/no adnexal mass. Other findings No abnormal free fluid. IMPRESSION: 1. Heterogeneous uterus with small intramural fibroids. 2. Thickened endometrium may represent secretory phase menstrual cycle or related to a small amount of blood product within the endometrium. Clinical correlation and follow-up with ultrasound recommended. If the patient is postmenopausal, further evaluation with hysteroscopy is  recommended. 3. Unremarkable ovaries. Electronically Signed   By: Anner Crete M.D.   On: 05/24/2017 23:26   US Pelvis Complete  Result Date: 05/24/2017 CLINICAL DATA:  51 year old female for with menorrhagia and passage of clots. EXAM: TRANSABDOMINAL AND TRANSVAGINAL ULTRASOUND OF PELVIS TECHNIQUE: Both transabdominal and transvaginal ultrasound examinations of the pelvis were performed. Transabdominal technique was performed for global imaging of the pelvis including uterus, ovaries, adnexal regions, and pelvic cul-de-sac. It was necessary to proceed with endovaginal exam following the transabdominal exam to visualize the endometrium and the ovaries. COMPARISON:  None FINDINGS: Uterus Measurements: 10.3 x 6.3 x 7.3 cm. The uterus is heterogeneous. There is a 11 x 10 x 9 mm posterior body intramural fibroid and a 9 x 10 x 6 mm lower uterine intramural fibroid. Endometrium Thickness: 16 mm. The endometrium is thickened and somewhat heterogeneous. No abnormal focal endometrial vascularity. Per history the patient is NOT postmenopausal and the endometrial thickness may be related to secretory phase of the menstrual cycle or related to small amount of blood product within the endometrium. However if the patient is postmenopausal or continues to become postmenopausal, this thickness of the endometrium is abnormal and further evaluation with hysteroscopy is warranted. Right ovary Measurements: 2.7 x 1.6 x 1.6 cm. There is a 1.5 x 1.2 x 1.1 cm cyst in the right ovary. Left ovary Measurements: 2.7 x 1.5 x 1.2 cm. Normal appearance/no adnexal mass. Other findings No abnormal free fluid. IMPRESSION: 1. Heterogeneous uterus with small intramural fibroids. 2. Thickened endometrium may represent secretory phase menstrual cycle or related to a small amount of blood product within the endometrium. Clinical correlation and follow-up with ultrasound recommended. If the patient is postmenopausal, further evaluation with  hysteroscopy is recommended. 3. Unremarkable ovaries. Electronically Signed   By: Anner Crete M.D.   On: 05/24/2017 23:26   Subjective: Bleeding has lightened up. She denies any chest pain or dyspnea at this time. Has been ambulating and eating well.   Discharge Exam: BP 115/60 (BP Location: Right Arm)   Pulse 77   Temp 98.9 F (37.2 C) (Oral)   Resp 18   Ht 5\' 4"  (1.626 m)   Wt 102 kg (224 lb 14.4 oz)   LMP 05/24/2017 Comment: h/o fibroids  SpO2 99%   BMI 38.60 kg/m   General: Pt is alert, awake, not in acute distress Cardiovascular: RRR, S1/S2 +, no rubs, no gallops Respiratory: CTA bilaterally, no wheezing, no rhonchi Abdominal: Soft, NT, ND, bowel sounds + Extremities: No edema, no cyanosis  Labs: Basic Metabolic Panel:  Recent Labs Lab 05/26/17 2150  NA 138  K 3.5  CL 106  CO2 24  GLUCOSE 120*  BUN 16  CREATININE 0.83  CALCIUM 8.6*   CBC:  Recent Labs Lab 05/26/17 1810  WBC 7.1  HGB 7.8*  HCT 26.8*  MCV 73.8*  PLT 313   Cardiac Enzymes:  Recent Labs Lab 05/27/17 0230 05/27/17 0420 05/27/17 0754  TROPONINI <0.03 <0.03 <0.03   Urinalysis    Component Value Date/Time   COLORURINE STRAW (A) 05/26/2017 1734   APPEARANCEUR CLEAR 05/26/2017 1734   LABSPEC 1.013 05/26/2017 1734   PHURINE 5.0 05/26/2017 1734   GLUCOSEU NEGATIVE 05/26/2017 1734   HGBUR SMALL (A) 05/26/2017 1734   BILIRUBINUR NEGATIVE 05/26/2017 1734   KETONESUR NEGATIVE 05/26/2017 1734   PROTEINUR NEGATIVE 05/26/2017 1734   UROBILINOGEN 0.2 07/03/2012 1143   NITRITE NEGATIVE 05/26/2017 1734   LEUKOCYTESUR NEGATIVE 05/26/2017 1734   Time coordinating discharge: Approximately 40 minutes  Vance Gather, MD  [facility] 05/29/2017, 3:42 PM Pager [phone]

## 2017-05-30 LAB — BPAM RBC
BLOOD PRODUCT EXPIRATION DATE: 201807252359
ISSUE DATE / TIME: 201807061021
Unit Type and Rh: 5100

## 2017-05-30 LAB — TYPE AND SCREEN
ABO/RH(D): O POS
ANTIBODY SCREEN: NEGATIVE
UNIT DIVISION: 0

## 2017-05-31 ENCOUNTER — Other Ambulatory Visit: Payer: Self-pay | Admitting: Family Medicine

## 2017-05-31 ENCOUNTER — Telehealth: Payer: Self-pay

## 2017-05-31 ENCOUNTER — Encounter: Payer: Self-pay | Admitting: Obstetrics and Gynecology

## 2017-05-31 DIAGNOSIS — N920 Excessive and frequent menstruation with regular cycle: Secondary | ICD-10-CM

## 2017-05-31 NOTE — Progress Notes (Signed)
Patient did not keep GYN referral appointment for 05/31/2017.  Durene Romans MD Attending Center for [employer] Fish farm manager)

## 2017-05-31 NOTE — Telephone Encounter (Signed)
Pt. Called requesting to speak with her PCP regarding her anxiety. Pt. Has an appt. Scheduled for 06/01/17. Please f/u with pt.

## 2017-05-31 NOTE — Telephone Encounter (Signed)
PCP states patient anxiety will be discussed at next appt with new PCP. Patient will have to be tried on low dose medications prior to receiving xanax. Patient has to be seen for insomnia.

## 2017-05-31 NOTE — Telephone Encounter (Signed)
Transitional Care Clinic Post-discharge Follow-Up Phone Call:  Date of Discharge: 05/29/17 Principal Discharge Diagnosis(es):  Chest pain - r/o MI, HTN, CAD Post-discharge Communication: (Clearly document all attempts clearly and date contact made) call placed to the patient. Call Completed: Yes                   With Whom: Patient Interpreter Needed: No  This CM spoke to the patient and she said she is feeling ' all right."  There was a lot of static on the phone and it was extremely difficult to hear the patient. When this CM met with the patient on 05/28/17 she reported that she was going to stay in her car as she had no where else to go.  Today, this CM inquired if she was staying in her care and she asked " why do you need to know?"  Explained that this CM was just checking on her.  The phone continued to have a lot of static and she confirmed her appointment for tomorrow - 06/01/17 @ 0945 at [facility].  She also said that she has her car and has transportation to the clinic. Instructed her to bring her medications to the clinic with her for review.  No other questions reported at this time.

## 2017-06-01 ENCOUNTER — Ambulatory Visit: Payer: 59 | Attending: Family Medicine | Admitting: Family Medicine

## 2017-06-01 ENCOUNTER — Encounter: Payer: Self-pay | Admitting: Family Medicine

## 2017-06-01 ENCOUNTER — Ambulatory Visit: Payer: 59 | Admitting: Licensed Clinical Social Worker

## 2017-06-01 ENCOUNTER — Telehealth: Payer: Self-pay

## 2017-06-01 VITALS — BP 131/79 | HR 90 | Temp 98.4°F | Resp 18 | Ht 64.0 in | Wt 223.0 lb

## 2017-06-01 DIAGNOSIS — F419 Anxiety disorder, unspecified: Secondary | ICD-10-CM

## 2017-06-01 DIAGNOSIS — I2583 Coronary atherosclerosis due to lipid rich plaque: Secondary | ICD-10-CM

## 2017-06-01 DIAGNOSIS — I2510 Atherosclerotic heart disease of native coronary artery without angina pectoris: Secondary | ICD-10-CM | POA: Diagnosis not present

## 2017-06-01 DIAGNOSIS — I10 Essential (primary) hypertension: Secondary | ICD-10-CM | POA: Diagnosis not present

## 2017-06-01 DIAGNOSIS — N939 Abnormal uterine and vaginal bleeding, unspecified: Secondary | ICD-10-CM

## 2017-06-01 DIAGNOSIS — Z955 Presence of coronary angioplasty implant and graft: Secondary | ICD-10-CM | POA: Insufficient documentation

## 2017-06-01 DIAGNOSIS — J40 Bronchitis, not specified as acute or chronic: Secondary | ICD-10-CM | POA: Insufficient documentation

## 2017-06-01 DIAGNOSIS — D500 Iron deficiency anemia secondary to blood loss (chronic): Secondary | ICD-10-CM | POA: Insufficient documentation

## 2017-06-01 DIAGNOSIS — R600 Localized edema: Secondary | ICD-10-CM | POA: Insufficient documentation

## 2017-06-01 DIAGNOSIS — Z7902 Long term (current) use of antithrombotics/antiplatelets: Secondary | ICD-10-CM | POA: Insufficient documentation

## 2017-06-01 DIAGNOSIS — R7303 Prediabetes: Secondary | ICD-10-CM | POA: Insufficient documentation

## 2017-06-01 DIAGNOSIS — G629 Polyneuropathy, unspecified: Secondary | ICD-10-CM

## 2017-06-01 DIAGNOSIS — Z9114 Patient's other noncompliance with medication regimen: Secondary | ICD-10-CM | POA: Insufficient documentation

## 2017-06-01 DIAGNOSIS — Z8719 Personal history of other diseases of the digestive system: Secondary | ICD-10-CM | POA: Insufficient documentation

## 2017-06-01 DIAGNOSIS — F4321 Adjustment disorder with depressed mood: Secondary | ICD-10-CM

## 2017-06-01 DIAGNOSIS — Z5900 Homelessness unspecified: Secondary | ICD-10-CM

## 2017-06-01 DIAGNOSIS — Z7982 Long term (current) use of aspirin: Secondary | ICD-10-CM | POA: Insufficient documentation

## 2017-06-01 DIAGNOSIS — F319 Bipolar disorder, unspecified: Secondary | ICD-10-CM | POA: Insufficient documentation

## 2017-06-01 DIAGNOSIS — I252 Old myocardial infarction: Secondary | ICD-10-CM | POA: Insufficient documentation

## 2017-06-01 DIAGNOSIS — Z951 Presence of aortocoronary bypass graft: Secondary | ICD-10-CM | POA: Insufficient documentation

## 2017-06-01 DIAGNOSIS — D508 Other iron deficiency anemias: Secondary | ICD-10-CM | POA: Diagnosis not present

## 2017-06-01 DIAGNOSIS — Z9889 Other specified postprocedural states: Secondary | ICD-10-CM | POA: Insufficient documentation

## 2017-06-01 DIAGNOSIS — G43909 Migraine, unspecified, not intractable, without status migrainosus: Secondary | ICD-10-CM | POA: Insufficient documentation

## 2017-06-01 MED ORDER — LISINOPRIL 5 MG PO TABS: 5.0000 mg | ORAL_TABLET | Freq: Every day | ORAL | 3 refills | Status: DC

## 2017-06-01 MED ORDER — CLOPIDOGREL BISULFATE 75 MG PO TABS
75.0000 mg | ORAL_TABLET | Freq: Every day | ORAL | 3 refills | Status: DC
Start: 2017-06-01 — End: 2017-08-04

## 2017-06-01 MED ORDER — ATORVASTATIN CALCIUM 40 MG PO TABS: 40.0000 mg | ORAL_TABLET | Freq: Every day | ORAL | 3 refills | Status: DC

## 2017-06-01 MED ORDER — FUROSEMIDE 20 MG PO TABS: 20.0000 mg | ORAL_TABLET | Freq: Every day | ORAL | 1 refills | Status: DC

## 2017-06-01 MED ORDER — ISOSORBIDE MONONITRATE ER 30 MG PO TB24: 15.0000 mg | ORAL_TABLET | Freq: Every day | ORAL | 3 refills | Status: DC

## 2017-06-01 MED ORDER — GABAPENTIN 300 MG PO CAPS: 300.0000 mg | ORAL_CAPSULE | Freq: Two times a day (BID) | ORAL | 3 refills | Status: DC

## 2017-06-01 MED ORDER — FERROUS SULFATE 325 (65 FE) MG PO TABS: 325.0000 mg | ORAL_TABLET | Freq: Every day | ORAL | 3 refills | Status: DC

## 2017-06-01 MED ORDER — ALBUTEROL SULFATE HFA 108 (90 BASE) MCG/ACT IN AERS: 2.0000 | INHALATION_SPRAY | Freq: Four times a day (QID) | RESPIRATORY_TRACT | 2 refills | Status: DC | PRN

## 2017-06-01 MED ORDER — BUSPIRONE HCL 5 MG PO TABS: 5.0000 mg | ORAL_TABLET | Freq: Two times a day (BID) | ORAL | 3 refills | Status: DC

## 2017-06-01 MED ORDER — METOPROLOL SUCCINATE ER 25 MG PO TB24: 12.5000 mg | ORAL_TABLET | Freq: Every day | ORAL | 3 refills | Status: DC

## 2017-06-01 MED FILL — CLOPIDOGREL 75 MG TABLET: 75 | 30 days supply | Qty: 30 | Fill #0

## 2017-06-01 MED FILL — FUROSEMIDE 20 MG TABLET: 20 | 30 days supply | Qty: 30 | Fill #0

## 2017-06-01 MED FILL — ISOSORBIDE MN ER 30 MG TAB: 30 | 60 days supply | Qty: 30 | Fill #0

## 2017-06-01 MED FILL — ATORVASTATIN 40 MG TABLET: 40 | 30 days supply | Qty: 30 | Fill #0

## 2017-06-01 MED FILL — busPIRone HCL 5 MG TABS: 5 | 30 days supply | Qty: 60 | Fill #0

## 2017-06-01 MED FILL — FERROUS SULFATE 325 MG TAB: 325 (65 FE) | 30 days supply | Qty: 30 | Fill #0

## 2017-06-01 MED FILL — GABAPENTIN 300 MG CAPSULE: 300 | 30 days supply | Qty: 60 | Fill #0

## 2017-06-01 MED FILL — VENTOLIN HFA 90 MCG INHALER: 108 (90 BAS | 25 days supply | Qty: 18 | Fill #0

## 2017-06-01 NOTE — Progress Notes (Signed)
Patient has not taken any medication "in a couple days"

## 2017-06-01 NOTE — Telephone Encounter (Signed)
Met with the patient when she was in the clinic today.  She stated that she was not aware of her GYN appointment yesterday. Call placed to the clinic and a new appointment was scheduled for 06/10/17 @ 0740. This information was given to the patient in writing and she was also reminded where to go for the appointment.

## 2017-06-01 NOTE — BH Specialist Note (Signed)
Integrated Behavioral Health Initial Visit  MRN: [id number] Name: Emma Turner   Session Start time: 10:15 AM Session End time: 11:00 AM Total time: 45 minutes  Type of Service: Cool Interpretor:No. Interpretor Name and Language: N/A   Warm Hand Off Completed.       SUBJECTIVE: Emma Turner is a 51 y.o. female accompanied by patient. Patient was referred by Dr. Jarold Song for depression and anxiety. Patient reports the following symptoms/concerns: overwhelming feelings of sadness and worry, difficulty sleeping, financial strain, homelessness, employment stress, and hx of suicidal ideations Duration of problem: Ongoing; Severity of problem: severe  OBJECTIVE: Mood: Dysphoric and Affect: Depressed and Tearful Risk of harm to self or others: Suicidal ideation No plan to harm self or others   LIFE CONTEXT: Family and Social: Pt has an adult daughter and aunt who resides in [place]. Pt states she can not reside with aunt and will not stay with her daughter due to not liking daughter's boyfriend. School/Work: Pt is employed as a Administrator. She is currently unable to return to work for another month due to a recent appeal on sleep study and additional medical concerns Self-Care: Pt is not taking her prescribed medications for physical or mental health Life Changes: Pt was has been hospitalized on multiple occassions within one month, is currently unable to work and has no where to reside. Patient reports family has "turned their backs on me" and   GOALS ADDRESSED: Patient will reduce symptoms of: anxiety and depression and increase knowledge and/or ability of: coping skills, healthy habits and stress reduction and also: Increase adequate support systems for patient/family, Increase motivation to adhere to plan of care and Improve medication compliance   INTERVENTIONS: Motivational Interviewing, Supportive Counseling and Link to [employer]   Standardized Assessments completed: PHQ 2&9 with C-SSRS  ASSESSMENT: Patient currently experiencing anxiety and depression triggered by declined health which resulted in her inability to return to work. She is currently homeless and is no longer receiving support (financial or emotional) from aunt or daughter.She reports overwhelming feelings of sadness and worry, difficulty sleeping, financial strain, homelessness, employment stress, and hx of suicidal ideations. Pt denied active SI/HI/AVH. Patient strongly encouraged to initiate psychotherapy and comply with medication management. Patient shared that she has discontinued all of her medications and is not interested in behavioral health services. LCSWA encouraged pt to follow-up with the [facility]) to provide additional services (showers, telephone, emergency housing, etc) and offered transportation assistance. LCSWA provided encouragement and crisis intervention resources.  PLAN: 1. Follow up with behavioral health clinician on : Pt was encouraged tocontact LCSWA if symptoms worsen or fail to improveto schedule behavioral appointments at [facility]. 2. Behavioral recommendations: LCSWA recommends that pt apply healthy coping skills discussed, comply with medication management,and utilize provided resources. Pt is encouraged to schedule follow up appointment with LCSWA 3. Referral(s): Armed forces logistics/support/administrative officer (LME/Outside Clinic) and [employer]:  Housing 4. "From scale of 1-10, how likely are you to follow plan?": 1/10  Rebekah Chesterfield, LCSW 06/03/17 2:41 PM
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
## 2017-06-01 NOTE — Patient Instructions (Signed)
Anemia, Nonspecific Anemia is a condition in which the concentration of red blood cells or hemoglobin in the blood is below normal. Hemoglobin is a substance in red blood cells that carries oxygen to the tissues of the body. Anemia results in not enough oxygen reaching these tissues. What are the causes? Common causes of anemia include:  Excessive bleeding. Bleeding may be internal or external. This includes excessive bleeding from periods (in women) or from the intestine.  Poor nutrition.  Chronic kidney, thyroid, and liver disease.  Bone marrow disorders that decrease red blood cell production.  Cancer and treatments for cancer.  HIV, AIDS, and their treatments.  Spleen problems that increase red blood cell destruction.  Blood disorders.  Excess destruction of red blood cells due to infection, medicines, and autoimmune disorders. What are the signs or symptoms?  Minor weakness.  Dizziness.  Headache.  Palpitations.  Shortness of breath, especially with exercise.  Paleness.  Cold sensitivity.  Indigestion.  Nausea.  Difficulty sleeping.  Difficulty concentrating. Symptoms may occur suddenly or they may develop slowly. How is this diagnosed? Additional blood tests are often needed. These help your health care provider determine the best treatment. Your health care provider will check your stool for blood and look for other causes of blood loss. How is this treated? Treatment varies depending on the cause of the anemia. Treatment can include:  Supplements of iron, vitamin B12, or folic acid.  Hormone medicines.  A blood transfusion. This may be needed if blood loss is severe.  Hospitalization. This may be needed if there is significant continual blood loss.  Dietary changes.  Spleen removal. Follow these instructions at home: Keep all follow-up appointments. It often takes many weeks to correct anemia, and having your health care provider check on your  condition and your response to treatment is very important. Get help right away if:  You develop extreme weakness, shortness of breath, or chest pain.  You become dizzy or have trouble concentrating.  You develop heavy vaginal bleeding.  You develop a rash.  You have bloody or black, tarry stools.  You faint.  You vomit up blood.  You vomit repeatedly.  You have abdominal pain.  You have a fever or persistent symptoms for more than 2-3 days.  You have a fever and your symptoms suddenly get worse.  You are dehydrated. This information is not intended to replace advice given to you by your health care provider. Make sure you discuss any questions you have with your health care provider. Document Released: 12/10/2004 Document Revised: 04/15/2016 Document Reviewed: 04/28/2013 Elsevier Interactive Patient Education  2017 Elsevier Inc.  

## 2017-06-03 ENCOUNTER — Telehealth: Payer: Self-pay | Admitting: Family Medicine

## 2017-06-03 NOTE — Telephone Encounter (Signed)
Pt. Came to facility to drop off disability paperwork to be filled out by PCP. Pt. Was told that paperwork will be put in PCP box. Please f/u

## 2017-06-04 ENCOUNTER — Other Ambulatory Visit: Payer: 59

## 2017-06-08 LAB — DRUG SCREEN 13 W/CONF, WB

## 2017-06-10 ENCOUNTER — Encounter: Payer: Self-pay | Admitting: Obstetrics & Gynecology

## 2017-06-14 LAB — TOXASSURE SELECT 13 (MW), URINE

## 2017-06-14 LAB — SPECIMEN STATUS REPORT

## 2017-06-15 ENCOUNTER — Ambulatory Visit: Payer: Self-pay | Admitting: Family Medicine

## 2017-06-21 NOTE — Telephone Encounter (Signed)
Pt called to find out the statu of her disability paperwork, she drop it on 06/03/17 still nothing, she need to know if it was fax and when and can she have a copy of them, please follow up,

## 2017-06-22 ENCOUNTER — Telehealth: Payer: Self-pay | Admitting: *Deleted

## 2017-06-22 NOTE — Telephone Encounter (Signed)
-----   Message from [facility] sent at 06/22/2017  2:37 PM EDT ----- Regarding: Sleep Study Emma Turner  I am unable to reach pt.   Phone disconnected.  Her insurance has rejected.  I am unable to do an auth for pt.  Chart indicates pt may be homeless but does have employment.    Please advise  Thanks  Charmaine

## 2017-06-22 NOTE — Telephone Encounter (Signed)
I was unable to reach the patient also.  Phone was disconnected when I called also.  Called the phone number for her emergency contact (aunt) but the lady on the phone says she does not know the patient. Sleep study has been cancelled today.

## 2017-06-24 ENCOUNTER — Encounter (HOSPITAL_BASED_OUTPATIENT_CLINIC_OR_DEPARTMENT_OTHER): Payer: Self-pay

## 2017-06-24 NOTE — Telephone Encounter (Signed)
Will route to nurse °

## 2017-06-25 NOTE — Telephone Encounter (Signed)
Yes I did see the paperwork. Carilyn Goodpasture tried to reach the patient to come pick it up.Paperwork was not for the physician to complete- it was stated on the form. It is meant for a peer or someone else who knows her.

## 2017-06-25 NOTE — Telephone Encounter (Signed)
Could you check with Dr. Jarold Song

## 2017-06-25 NOTE — Telephone Encounter (Signed)
Have you received the paperwork for this patient?

## 2017-06-25 NOTE — Telephone Encounter (Signed)
Pt is looking for paperwork. Please follow up with pt.

## 2017-06-28 NOTE — Telephone Encounter (Signed)
Pt was given paperwork back when she came in the office 2 wks ago. She did not have an OV. Writer so happened to see her when calling another patient back.   Explained at that time that it was unable to be completed by PCP per instructions on the form. She then stated she was going to see if there was another form she needed to get for PCP to fill out.   Attempt to call patient, no answer.

## 2017-07-01 NOTE — Telephone Encounter (Signed)
Unable to reach, no answer, attempt to call patient.

## 2017-07-05 ENCOUNTER — Ambulatory Visit: Payer: Self-pay | Admitting: Family Medicine

## 2017-07-07 ENCOUNTER — Telehealth: Payer: Self-pay | Admitting: Licensed Clinical Social Worker

## 2017-07-07 NOTE — Telephone Encounter (Signed)
LCSWA attempted to contact pt to follow-up. A message was left requesting a return call.

## 2017-07-12 ENCOUNTER — Ambulatory Visit (INDEPENDENT_AMBULATORY_CARE_PROVIDER_SITE_OTHER): Payer: Self-pay | Admitting: Obstetrics & Gynecology

## 2017-07-12 ENCOUNTER — Other Ambulatory Visit (HOSPITAL_COMMUNITY)
Admission: RE | Admit: 2017-07-12 | Discharge: 2017-07-12 | Disposition: A | Discharge status: Home / Self Care | Payer: Medicaid Other | Source: Ambulatory Visit | Attending: Obstetrics & Gynecology | Admitting: Obstetrics & Gynecology

## 2017-07-12 ENCOUNTER — Other Ambulatory Visit (HOSPITAL_COMMUNITY)
Admission: RE | Admit: 2017-07-12 | Discharge: 2017-07-12 | Disposition: A | Discharge status: Home / Self Care | Payer: Self-pay | Source: Ambulatory Visit | Attending: Obstetrics & Gynecology | Admitting: Obstetrics & Gynecology

## 2017-07-12 ENCOUNTER — Encounter: Payer: Self-pay | Admitting: Obstetrics & Gynecology

## 2017-07-12 ENCOUNTER — Ambulatory Visit: Payer: Self-pay | Admitting: Family Medicine

## 2017-07-12 VITALS — BP 157/91 | HR 78 | Wt 222.8 lb

## 2017-07-12 DIAGNOSIS — Z0000 Encounter for general adult medical examination without abnormal findings: Secondary | ICD-10-CM | POA: Insufficient documentation

## 2017-07-12 DIAGNOSIS — D500 Iron deficiency anemia secondary to blood loss (chronic): Secondary | ICD-10-CM

## 2017-07-12 DIAGNOSIS — D251 Intramural leiomyoma of uterus: Secondary | ICD-10-CM | POA: Insufficient documentation

## 2017-07-12 DIAGNOSIS — N921 Excessive and frequent menstruation with irregular cycle: Secondary | ICD-10-CM | POA: Insufficient documentation

## 2017-07-12 NOTE — Progress Notes (Signed)
   Subjective:    Patient ID: Emma Turner, female    DOB: [birth year], 51 y.o.   MRN: [id number]  HPI  51 yo lady here for a EMBX due to DUB/menorrhagia/anemia/fibroids.   Review of Systems     Objective:   Physical Exam Well nourished, well hydrated obese white female, no apparent distress Breathing, conversing, and ambulating normally Abd- benign Vulva/vagina/cervix- normal  UPT negative, consent signed, time out done Cervix prepped with betadine and grasped with a single tooth tenaculum Uterus sounded to 9 cm Pipelle used for 2 passes with a moderate amount of tissue obtained. She tolerated the procedure well.      Assessment & Plan:  Menorrhagia/fibroids (small)/anemia- await pathology results

## 2017-07-13 LAB — TSH: TSH: 1.48 u[IU]/mL (ref 0.450–4.500)

## 2017-07-14 ENCOUNTER — Telehealth: Payer: Self-pay | Admitting: Licensed Clinical Social Worker

## 2017-07-14 LAB — CYTOLOGY - PAP
ADEQUACY: ABSENT
Diagnosis: NEGATIVE
HPV (WINDOPATH): NOT DETECTED

## 2017-07-14 NOTE — Telephone Encounter (Signed)
LCSWA attempted to contact pt to follow up on behavioral health. Message left requesting return call.

## 2017-07-20 IMAGING — DX DG CHEST 2V
2 series · 2 of 2 positions shown · non-contrast
Comparison: Radiograph 05/09/2017.  CTA 04/22/2015

CLINICAL DATA: Chest pain.

EXAM:
CHEST  2 VIEW

[chest pa]
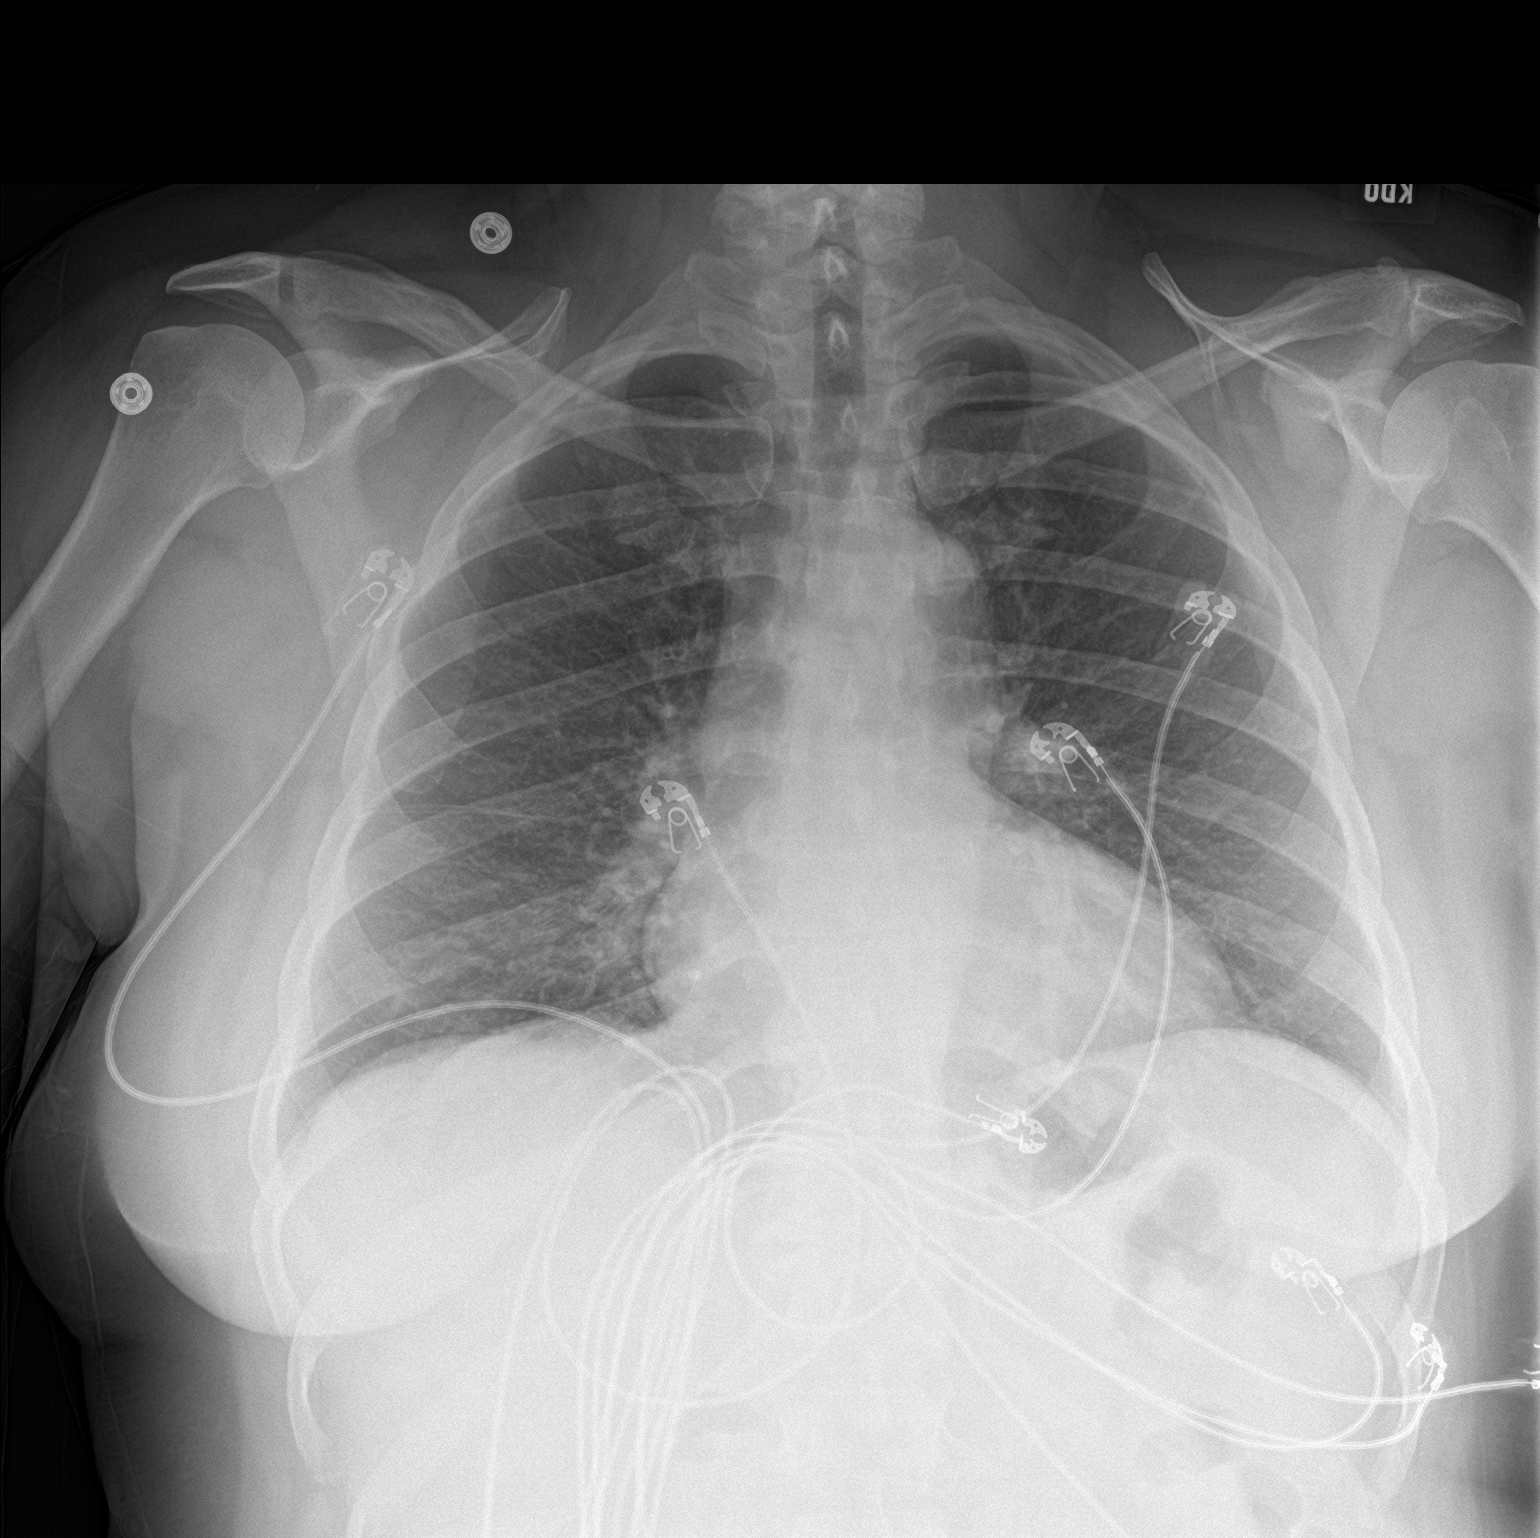

[chest lat]
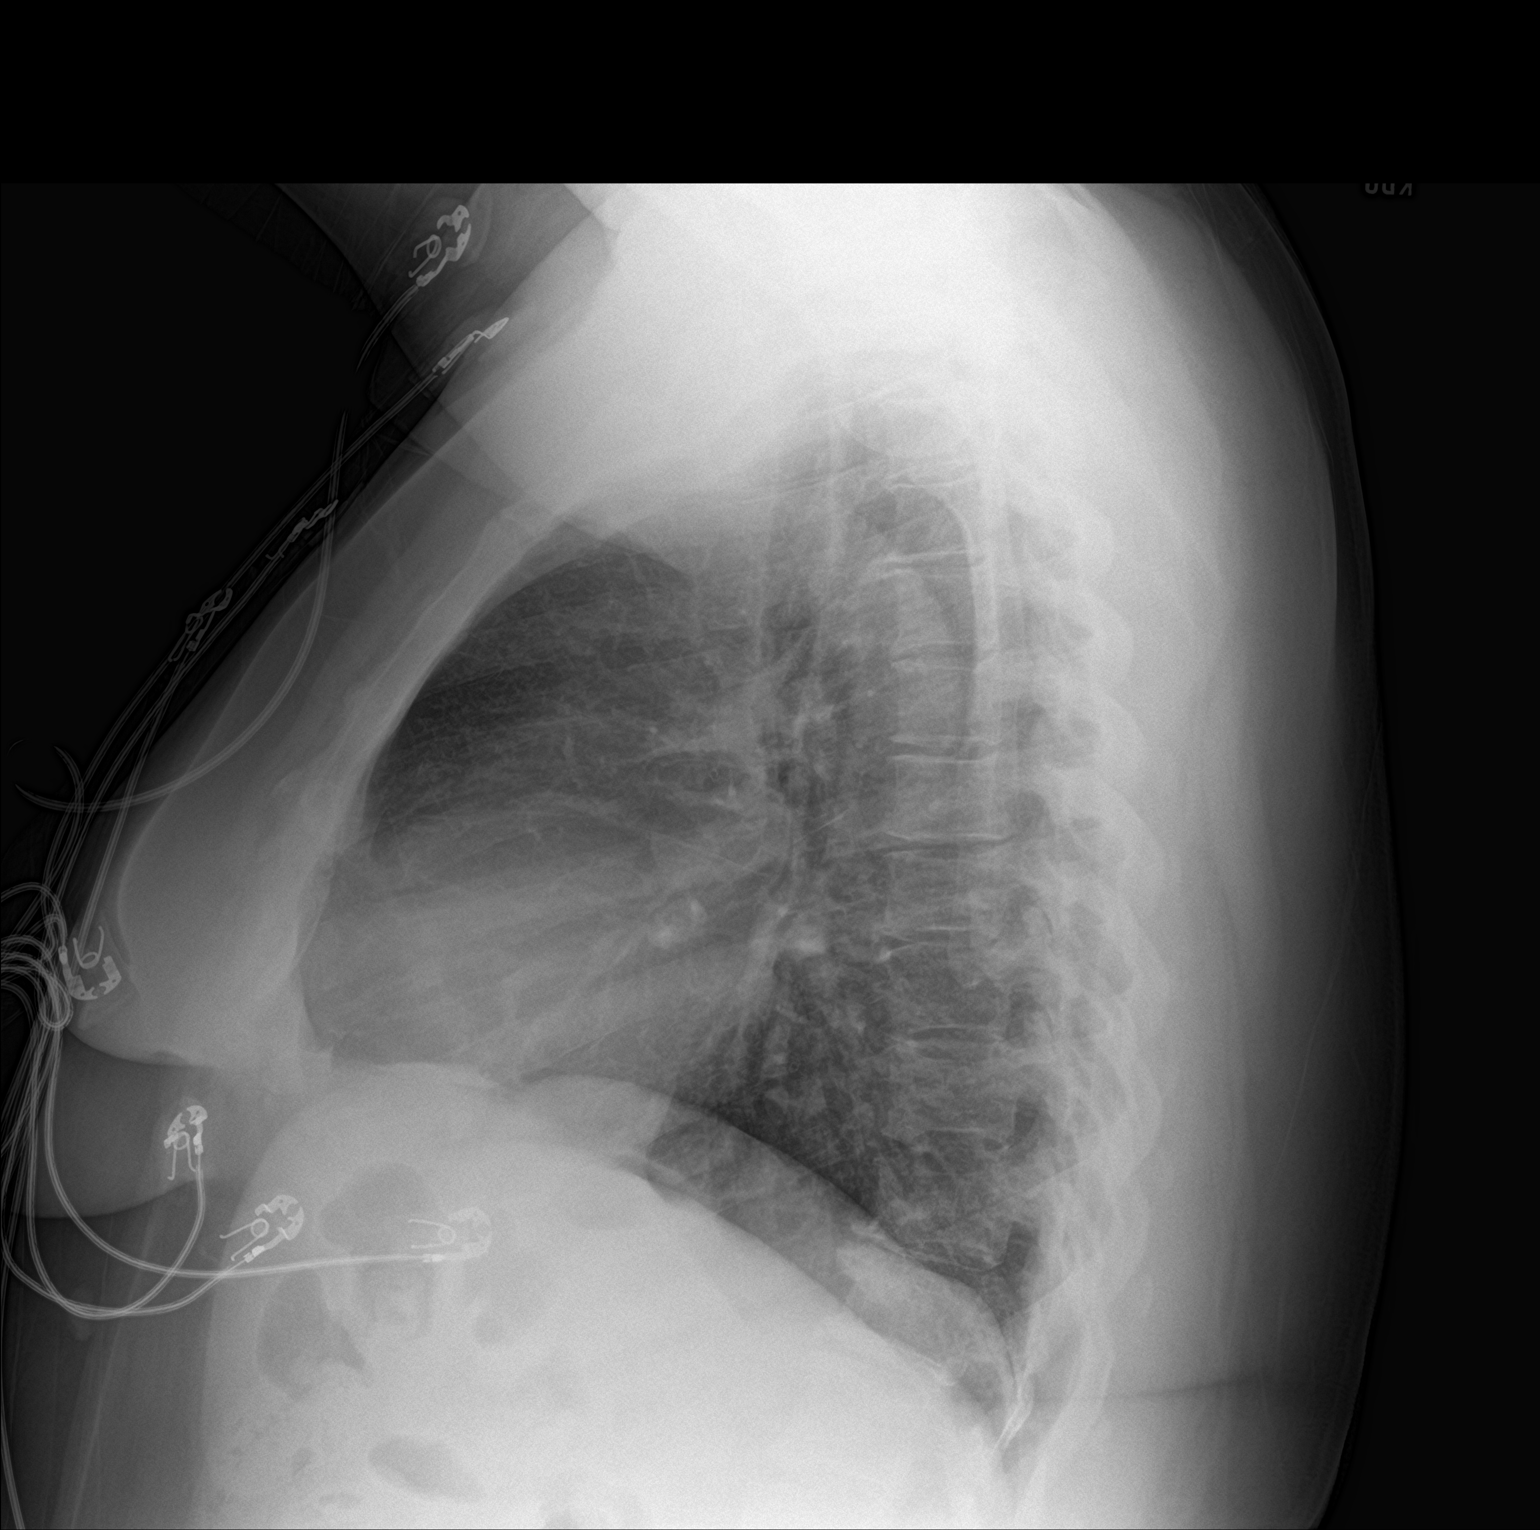

[2 of 2 positions shown; findings below may reference images not displayed]

FINDINGS: The cardiomediastinal contours are unchanged with borderline mild
cardiomegaly. Mild bronchial thickening. Pulmonary vasculature is
normal. No consolidation, pleural effusion, or pneumothorax. No
acute osseous abnormalities are seen.
IMPRESSION: Chronic bronchial thickening. Stable borderline cardiomegaly. No
acute abnormality.

## 2017-07-23 ENCOUNTER — Telehealth: Payer: Self-pay | Admitting: Family Medicine

## 2017-07-23 ENCOUNTER — Telehealth: Payer: Self-pay | Admitting: Cardiology

## 2017-07-23 NOTE — Telephone Encounter (Signed)
Left message to call back  

## 2017-07-23 NOTE — Telephone Encounter (Signed)
I do not have any indication for referral; this will need to be discussed at an office visit so we can optimize her condition in the primary care setting prior to referral.

## 2017-07-23 NOTE — Telephone Encounter (Signed)
New message       Pt called very upset regarding her sleep study had been cancelled and she did not know why.  It told pt we had tried to contact her but her number was invalid. She said she had a new number and gave it to me.  Then pt started crying and hollering stating that she had been fired because her FMLA was not extended and she lost everything and pt hung up.  Not sure if she wanted [place] to call her back but she did at first inquire about her sleep study.
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
## 2017-07-23 NOTE — Telephone Encounter (Signed)
Will route to PCP 

## 2017-07-23 NOTE — Telephone Encounter (Signed)
Patient called requesting referral to pain management, please f/up

## 2017-07-26 ENCOUNTER — Telehealth: Payer: Self-pay | Admitting: Family Medicine

## 2017-07-26 NOTE — Telephone Encounter (Signed)
Patient called requesting a letter in order to get help from an attorney , pt needs a letter stating that stress is the factor for her heart attack. Pt states she just has a recent heart attack last week and was in the hospital in [place]. Please f/up

## 2017-07-26 NOTE — Telephone Encounter (Signed)
Returned call: Reached out to the patient to explain why her sleep study was cancelled.  I am unable to reach pt.   Phone disconnected.  Her insurance has rejected.  I am unable to do an auth for pt.  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  Patient states she is in the process of suing her previous job.

## 2017-07-26 NOTE — Telephone Encounter (Signed)
Will route to PCP 

## 2017-07-27 ENCOUNTER — Telehealth: Payer: Self-pay | Admitting: Cardiology

## 2017-07-27 NOTE — Telephone Encounter (Signed)
Pt came to the office to check on the statu of the request for the letter for her lawyer, please follow up

## 2017-07-27 NOTE — Telephone Encounter (Signed)
Walk In pt Form-pt Needs Letter stating she's under Doctors care. Placed in [employer]

## 2017-07-27 NOTE — Telephone Encounter (Signed)
Looks like she presented to cardiology today for same. I would advise she obtain that from cardiology.

## 2017-08-02 ENCOUNTER — Ambulatory Visit: Payer: Self-pay | Admitting: Obstetrics & Gynecology

## 2017-08-04 ENCOUNTER — Telehealth: Payer: Self-pay | Admitting: Cardiology

## 2017-08-04 ENCOUNTER — Ambulatory Visit: Payer: Self-pay | Attending: Family Medicine | Admitting: Family Medicine

## 2017-08-04 ENCOUNTER — Encounter: Payer: Self-pay | Admitting: Family Medicine

## 2017-08-04 VITALS — BP 167/94 | HR 86 | Temp 98.4°F | Ht 64.0 in | Wt 220.2 lb

## 2017-08-04 DIAGNOSIS — F329 Major depressive disorder, single episode, unspecified: Secondary | ICD-10-CM | POA: Insufficient documentation

## 2017-08-04 DIAGNOSIS — G629 Polyneuropathy, unspecified: Secondary | ICD-10-CM | POA: Insufficient documentation

## 2017-08-04 DIAGNOSIS — R600 Localized edema: Secondary | ICD-10-CM | POA: Insufficient documentation

## 2017-08-04 DIAGNOSIS — Z955 Presence of coronary angioplasty implant and graft: Secondary | ICD-10-CM | POA: Insufficient documentation

## 2017-08-04 DIAGNOSIS — D508 Other iron deficiency anemias: Secondary | ICD-10-CM | POA: Insufficient documentation

## 2017-08-04 DIAGNOSIS — J40 Bronchitis, not specified as acute or chronic: Secondary | ICD-10-CM | POA: Insufficient documentation

## 2017-08-04 DIAGNOSIS — R0602 Shortness of breath: Secondary | ICD-10-CM | POA: Insufficient documentation

## 2017-08-04 DIAGNOSIS — I10 Essential (primary) hypertension: Secondary | ICD-10-CM | POA: Insufficient documentation

## 2017-08-04 DIAGNOSIS — Z8719 Personal history of other diseases of the digestive system: Secondary | ICD-10-CM | POA: Insufficient documentation

## 2017-08-04 DIAGNOSIS — F419 Anxiety disorder, unspecified: Secondary | ICD-10-CM | POA: Insufficient documentation

## 2017-08-04 DIAGNOSIS — F4310 Post-traumatic stress disorder, unspecified: Secondary | ICD-10-CM | POA: Insufficient documentation

## 2017-08-04 DIAGNOSIS — I25119 Atherosclerotic heart disease of native coronary artery with unspecified angina pectoris: Secondary | ICD-10-CM | POA: Insufficient documentation

## 2017-08-04 DIAGNOSIS — Z7902 Long term (current) use of antithrombotics/antiplatelets: Secondary | ICD-10-CM | POA: Insufficient documentation

## 2017-08-04 DIAGNOSIS — Z79899 Other long term (current) drug therapy: Secondary | ICD-10-CM | POA: Insufficient documentation

## 2017-08-04 DIAGNOSIS — I2510 Atherosclerotic heart disease of native coronary artery without angina pectoris: Secondary | ICD-10-CM

## 2017-08-04 DIAGNOSIS — Z590 Homelessness: Secondary | ICD-10-CM | POA: Insufficient documentation

## 2017-08-04 DIAGNOSIS — I252 Old myocardial infarction: Secondary | ICD-10-CM | POA: Insufficient documentation

## 2017-08-04 DIAGNOSIS — F3170 Bipolar disorder, currently in remission, most recent episode unspecified: Secondary | ICD-10-CM | POA: Insufficient documentation

## 2017-08-04 DIAGNOSIS — Z7982 Long term (current) use of aspirin: Secondary | ICD-10-CM | POA: Insufficient documentation

## 2017-08-04 DIAGNOSIS — I2583 Coronary atherosclerosis due to lipid rich plaque: Secondary | ICD-10-CM | POA: Insufficient documentation

## 2017-08-04 DIAGNOSIS — Z9119 Patient's noncompliance with other medical treatment and regimen: Secondary | ICD-10-CM | POA: Insufficient documentation

## 2017-08-04 MED ORDER — ISOSORBIDE MONONITRATE ER 30 MG PO TB24: 15.0000 mg | ORAL_TABLET | Freq: Every day | ORAL | 3 refills | Status: DC

## 2017-08-04 MED ORDER — ATORVASTATIN CALCIUM 40 MG PO TABS: 40.0000 mg | ORAL_TABLET | Freq: Every day | ORAL | 3 refills | Status: DC

## 2017-08-04 MED ORDER — FERROUS SULFATE 325 (65 FE) MG PO TABS: 325.0000 mg | ORAL_TABLET | Freq: Every day | ORAL | 3 refills | Status: DC

## 2017-08-04 MED ORDER — METOPROLOL SUCCINATE ER 25 MG PO TB24: 12.5000 mg | ORAL_TABLET | Freq: Every day | ORAL | 3 refills | Status: DC

## 2017-08-04 MED ORDER — CLOPIDOGREL BISULFATE 75 MG PO TABS: 75.0000 mg | ORAL_TABLET | Freq: Every day | ORAL | 3 refills | Status: DC

## 2017-08-04 MED ORDER — LISINOPRIL 5 MG PO TABS: 5.0000 mg | ORAL_TABLET | Freq: Every day | ORAL | 3 refills | Status: DC

## 2017-08-04 MED ORDER — FUROSEMIDE 20 MG PO TABS: 20.0000 mg | ORAL_TABLET | Freq: Every day | ORAL | 3 refills | Status: DC

## 2017-08-04 MED ORDER — BUSPIRONE HCL 5 MG PO TABS: 5.0000 mg | ORAL_TABLET | Freq: Two times a day (BID) | ORAL | 3 refills | Status: DC

## 2017-08-04 MED ORDER — GABAPENTIN 300 MG PO CAPS: 300.0000 mg | ORAL_CAPSULE | Freq: Two times a day (BID) | ORAL | 3 refills | Status: DC

## 2017-08-04 MED ORDER — ALBUTEROL SULFATE HFA 108 (90 BASE) MCG/ACT IN AERS: 2.0000 | INHALATION_SPRAY | Freq: Four times a day (QID) | RESPIRATORY_TRACT | 2 refills | Status: DC | PRN

## 2017-08-04 NOTE — Telephone Encounter (Signed)
Discussed with patient, she is unhappy with recommendation to get letter from PCP or GYN, she will discuss further  with Dr Radford Pax at appt 08/11/17

## 2017-08-04 NOTE — Patient Instructions (Signed)

## 2017-08-04 NOTE — Telephone Encounter (Signed)
Emma Turner is calling because she ask for a letter to show that stress caused her heart attack to get her unemployment , until she is physically able to go back to work . Please call at [phone]

## 2017-08-04 NOTE — Telephone Encounter (Signed)
Her last admission was for severe anemia and demand ischemia.  It was not felt to be due to acute coronary syndrome.  She will have to get a letter from her PCP or GYN
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
## 2017-08-06 ENCOUNTER — Telehealth: Payer: Self-pay | Admitting: Cardiology

## 2017-08-06 DIAGNOSIS — I2510 Atherosclerotic heart disease of native coronary artery without angina pectoris: Secondary | ICD-10-CM

## 2017-08-06 DIAGNOSIS — Z862 Personal history of diseases of the blood and blood-forming organs and certain disorders involving the immune mechanism: Secondary | ICD-10-CM

## 2017-08-06 NOTE — Telephone Encounter (Signed)
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
## 2017-08-09 NOTE — Telephone Encounter (Signed)
Called patient and as soon as she answered the phone she stated she needed to call right back.

## 2017-08-09 NOTE — Telephone Encounter (Signed)
-----   Message from Sueanne Margarita, MD sent at 08/09/2017 11:00 AM EDT ----- Please make sure we have a new CBC prior to getting her stress test due to recent anemia

## 2017-08-09 NOTE — Telephone Encounter (Signed)
I cannot write a letter saying the her MI was from her job stress because we do not know what triggered it.  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

## 2017-08-09 NOTE — Telephone Encounter (Signed)
Follow Up:; ° ° °Returning your call. °

## 2017-08-09 NOTE — Telephone Encounter (Signed)
Spoke with patient and reviewed recommendations. Patient is very upset that she has reached out to the office several times trying to get her stress test scheduled and her extension for FMLA sent to her employer. Explained to patient I would follow up tomorrow to see if extension was done/faxed

## 2017-08-10 NOTE — Telephone Encounter (Signed)
Discussed with Gerome Sam RN with Dr Radford Pax. She will inform Dr Radford Pax of the situation so this can be discussed further at follow up scheduled for tomorrow. Appointment was confirmed with patient *08-10-17 10:25 am

## 2017-08-11 ENCOUNTER — Encounter: Payer: Self-pay | Admitting: Cardiology

## 2017-08-11 ENCOUNTER — Encounter (INDEPENDENT_AMBULATORY_CARE_PROVIDER_SITE_OTHER): Payer: Self-pay

## 2017-08-11 ENCOUNTER — Ambulatory Visit (INDEPENDENT_AMBULATORY_CARE_PROVIDER_SITE_OTHER): Payer: Self-pay | Admitting: Cardiology

## 2017-08-11 VITALS — BP 170/100 | HR 92 | Ht 64.0 in | Wt 225.0 lb

## 2017-08-11 DIAGNOSIS — I10 Essential (primary) hypertension: Secondary | ICD-10-CM

## 2017-08-11 DIAGNOSIS — I2510 Atherosclerotic heart disease of native coronary artery without angina pectoris: Secondary | ICD-10-CM

## 2017-08-11 NOTE — Progress Notes (Signed)
Cardiology Office Note:    Date:  08/12/2017   ID:  Emma Turner, DOB [birth year], MRN [id number]  PCP:  Arnoldo Morale, MD  Cardiologist:  Fransico Him, MD   Referring MD: Arnoldo Morale, MD   Chief Complaint  Patient presents with  . Coronary Artery Disease  . Hypertension  . Hyperlipidemia    History of Present Illness:    Emma Turner is a 51 y.o. female with a hx of CAD, HTN, tobacco use, OSA which was mandated by DOT and found to have OSA but refused CPAP due to claustrophobia, and Bipolar 1 disorder. She also has a history of medical noncompliance stated in her chart multiple times.  As documented in her admission note from 04/08/2017, she was under a lot of stress from her employer who put her out of work due to not wearing her CPAP device.    She was admitted to [facility] on 04/08/17 with chest pain that started while sitting eating ice and ruminating about her job problem. She ruled in for NSTEMI with troponin peaking at 0.08.Subsequent LHC was performed by Dr. Burt Knack. She was found to have single vessel CAD involving the mid LAD, 80% stenosis, treated successfully with DES. There was moderate mid RCA stenosis, treated medically. The left main and LCx were both widely patent. LVEF was normal at 65%. She was initially placed on DAPT with ASA + Brilinta, however did not tolerate Brilinta due to dyspnea.   She had recurrent CP and presented back to the hospital on 04/13/17. She was admitted and cardiac enzymes were cycled but were negative, ruling out acute instent thrombosis. There were also no EKG changes. There were no indications for repeat [facility]. Her CP was felt atypical. She was switched from Brilinta to Plavix. ASA continued.   She was reassessed on 04/23/17 for post hospital f/u. She denied CP but noted mild exertional dyspnea and new complaint of bilateral LEE, however pro BNP was normal at 76. Her diastolic BP was elevated in the 90s. She was instructed to continue HCTZ, elevate legs and  avoid sodium. Beta blocker therapy with metoprolol was also added 12.5 mg BID given her CAD and HTN as well as Lipitor 40 mg (LDL during hospitalization was 87 mg/dL).   She has had issues with medication compliance and a lot of anxiety given her social situation. She has been trying to file for disability and has asked both her PCP and myself to write her a note stating that she had an MI due to mental stress. Her PCP has refused to do this.  Her PCP is at [facility] and she was instructed to seek assistance with medications there. She apparently had called the office stating that we caused her to lose her job because they required a DOT stress test for her to be able to drive but in looking back at the records she lost her job due to refusal to wear CPAP.  She then asked [place] to write a letter for disability due to job stress causing her to have CAD despite the fact the she has continued to smoke.  She has called the office multiple times stating that she needed a DOT stress test to extend her FLMA but she was taken out of work due to her OSA.  I agreed to order the stress test but she was very rude to my nurse demanding that I Probation officer her a letter stating that her MI was caused by her job stress.  Today she presents  for followup.  On arrival in the room it was very clear that she was upset.  When I asked how she was doing she immediately became angry accusing my staff and myself of causing her to lose her job due to not having a stress test.  I told her that I had just become aware of the need for a stress test last week and ordered the study but did notice that she had actually lost her job due to refusing to use CPAP and not because of needing a DOT stress test.  This is also stated in her PCP note and in her initial note from admission in May with her MI.    I tried to explain to her that we had gotten her messages and documented that she called and the nurse stated that she talked with her and she did not have  time to have the study done at that time and would call back and never called back to our office until recently when she called [place] accusing [place] of not following through.  She then became very angry that I would not write a letter stating that her MI was caused by stress.  I explained to her that we could not prove that stress was the etiology and likely the reason she developed significant CAD was from years of tobacco abuse which she became very angry about.    She refused to answer any of my questions in regard to how she was doing form a cardiac standpoint at this time and kept accusing me of making her lose her job.  I told her I would have my nurse come in and go over all the office communications with her at which point she screamed that she was "done with [place]" and walked out before I could get my nurse to come in.    ROS    Signed, Fransico Him, MD  08/12/2017 6:45 PM    [place]

## 2017-08-16 ENCOUNTER — Telehealth: Payer: Self-pay | Admitting: Cardiology

## 2017-08-16 NOTE — Telephone Encounter (Signed)
Patient dismissed from [facility]. by Fransico Him MD , effective August 11, 2017. Dismissal letter sent out by certified / registered mail.  daj

## 2017-08-23 ENCOUNTER — Encounter (HOSPITAL_COMMUNITY): Payer: Self-pay

## 2017-08-23 DIAGNOSIS — Z5321 Procedure and treatment not carried out due to patient leaving prior to being seen by health care provider: Secondary | ICD-10-CM | POA: Insufficient documentation

## 2017-08-23 DIAGNOSIS — R079 Chest pain, unspecified: Secondary | ICD-10-CM | POA: Insufficient documentation

## 2017-08-23 NOTE — ED Triage Notes (Signed)
Pt states that several days ago began to have central CP with radiation to back and neck and SOB, denies n/v. Hx of MI with stent placement

## 2017-08-24 ENCOUNTER — Emergency Department (HOSPITAL_COMMUNITY)
Admission: EM | Admit: 2017-08-24 | Discharge: 2017-08-24 | Disposition: A | Discharge status: Home / Self Care | Payer: Self-pay | Attending: Emergency Medicine | Admitting: Emergency Medicine

## 2017-08-24 ENCOUNTER — Emergency Department (HOSPITAL_COMMUNITY): Payer: Self-pay

## 2017-08-24 LAB — I-STAT TROPONIN, ED: Troponin i, poc: 0 ng/mL (ref 0.00–0.08)

## 2017-08-24 LAB — BASIC METABOLIC PANEL
Anion gap: 8 (ref 5–15)
BUN: 10 mg/dL (ref 6–20)
CO2: 27 mmol/L (ref 22–32)
Calcium: 9.2 mg/dL (ref 8.9–10.3)
Chloride: 105 mmol/L (ref 101–111)
Creatinine, Ser: 0.82 mg/dL (ref 0.44–1.00)
Glucose, Bld: 155 mg/dL — ABNORMAL HIGH (ref 65–99)
POTASSIUM: 3.5 mmol/L (ref 3.5–5.1)
SODIUM: 140 mmol/L (ref 135–145)

## 2017-08-24 LAB — CBC
HEMATOCRIT: 36 % (ref 36.0–46.0)
Hemoglobin: 11 g/dL — ABNORMAL LOW (ref 12.0–15.0)
MCH: 23.3 pg — ABNORMAL LOW (ref 26.0–34.0)
MCHC: 30.6 g/dL (ref 30.0–36.0)
MCV: 76.3 fL — ABNORMAL LOW (ref 78.0–100.0)
PLATELETS: 270 10*3/uL (ref 150–400)
RBC: 4.72 MIL/uL (ref 3.87–5.11)
RDW: 19.5 % — AB (ref 11.5–15.5)
WBC: 7 10*3/uL (ref 4.0–10.5)

## 2017-08-24 NOTE — ED Notes (Addendum)
Pt upset that she has not gone to a room yet due to her pain, triage nurse offered ibprophen for pain and patient denied and left.

## 2017-08-26 NOTE — Telephone Encounter (Signed)
Received signed domestic return receipt verifying delivery of certified letter on August 18, 2017. Article number [id number]

## 2017-11-05 ENCOUNTER — Ambulatory Visit: Payer: Self-pay | Admitting: Family Medicine

## 2018-01-22 IMAGING — US US TRANSVAGINAL NON-OB
1 series · 13 of 25 positions shown · non-contrast
Comparison: None

CLINICAL DATA: 51-year-old female for with menorrhagia and passage
of clots.

EXAM:
TRANSABDOMINAL AND TRANSVAGINAL ULTRASOUND OF PELVIS
TECHNIQUE: Both transabdominal and transvaginal ultrasound examinations of the
pelvis were performed. Transabdominal technique was performed for
global imaging of the pelvis including uterus, ovaries, adnexal
regions, and pelvic cul-de-sac. It was necessary to proceed with
endovaginal exam following the transabdominal exam to visualize the
endometrium and the ovaries.

[Series 1: us transvaginal non-ob · 0.27mm/px · 61 acquisitions, 13 frames shown]
[im 1/61]
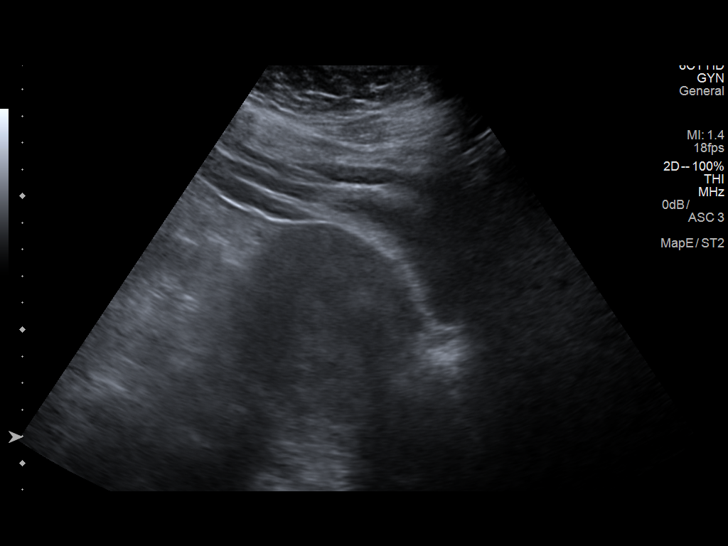
[im 6/61]
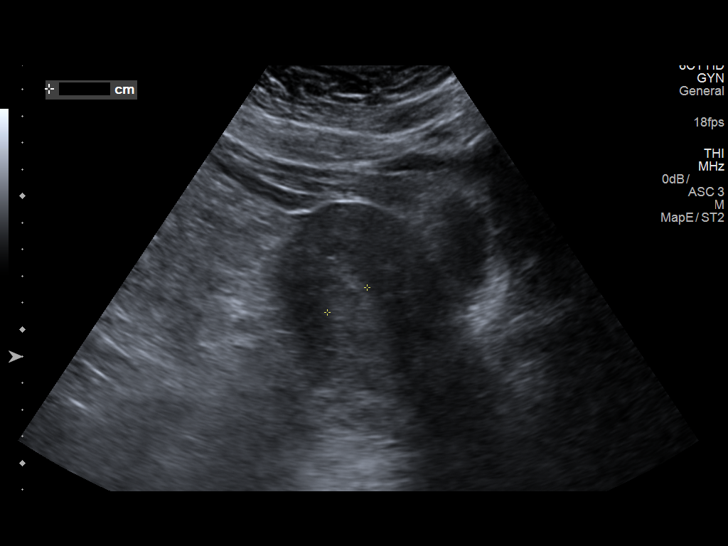
[im 11/61]
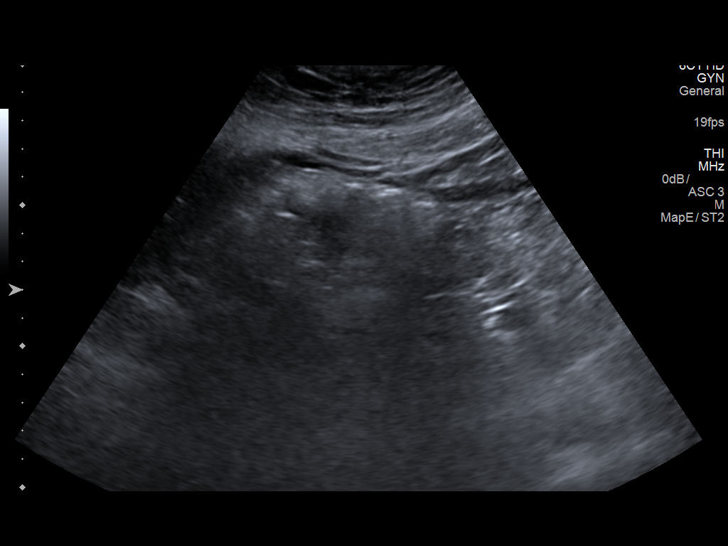
[im 16/61]
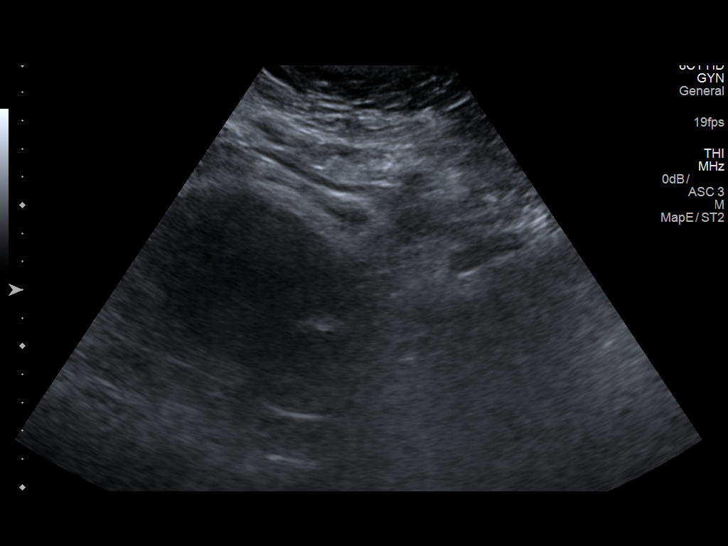
[im 21/61]
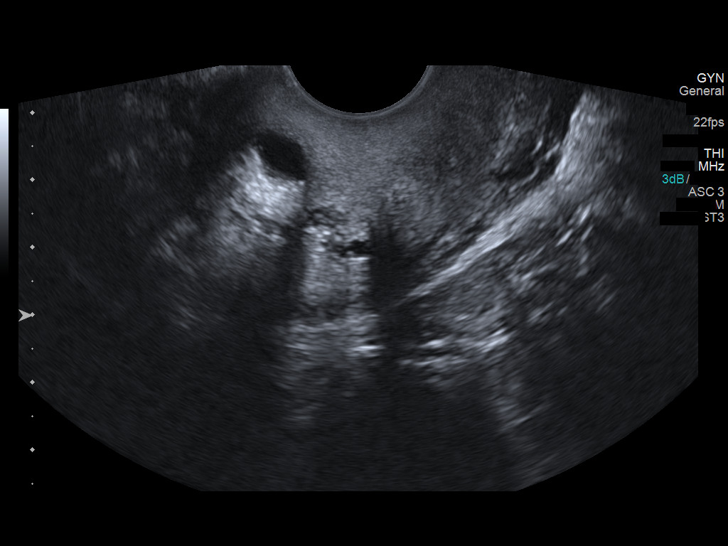
[im 26/61]
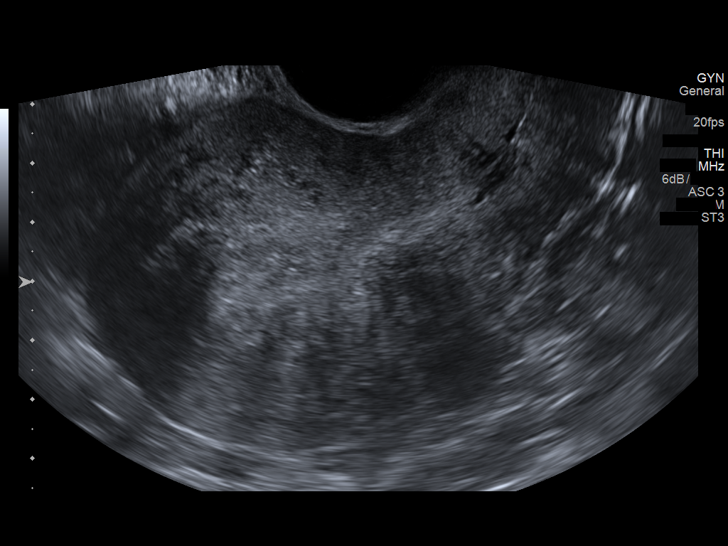
[im 31/61]
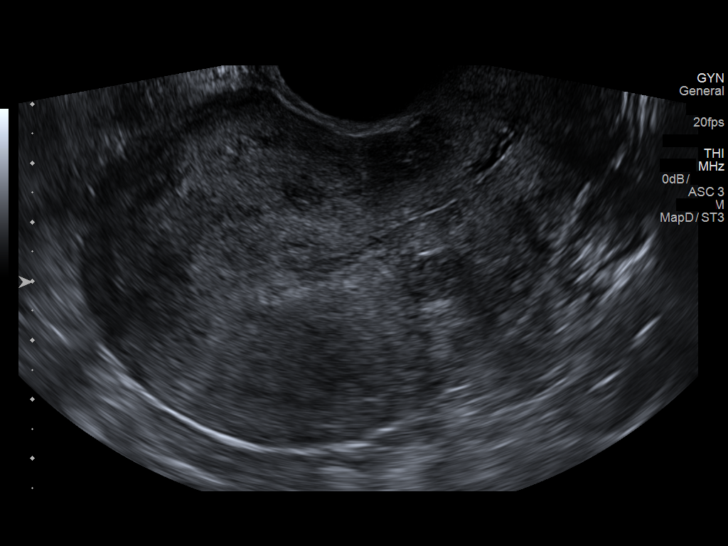
[im 36/61]
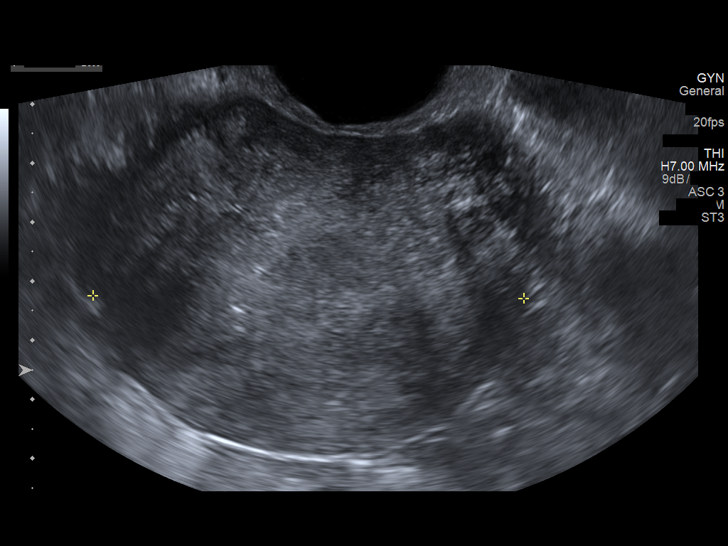
[im 41/61]
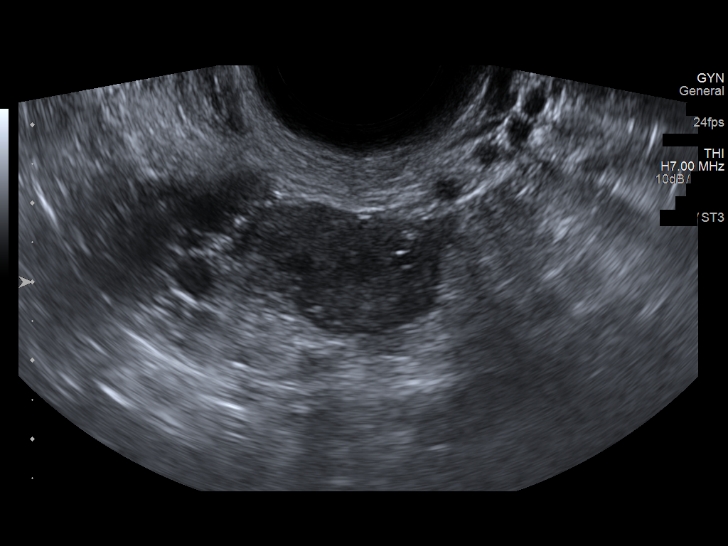
[im 46/61]
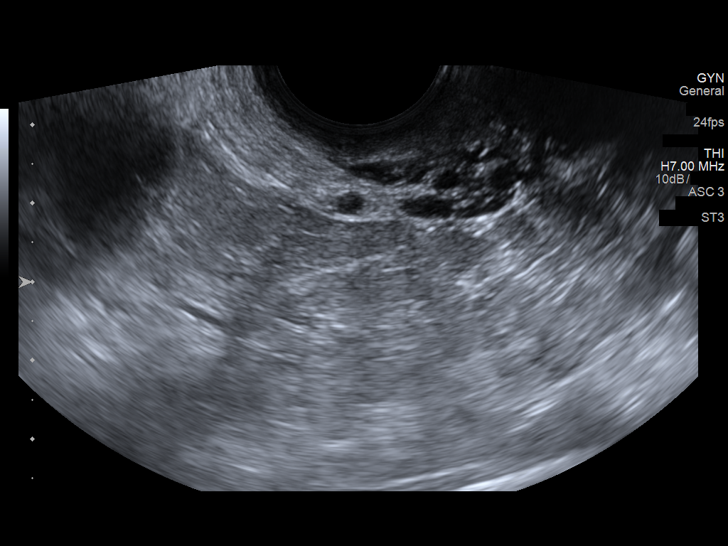
[im 51/61]
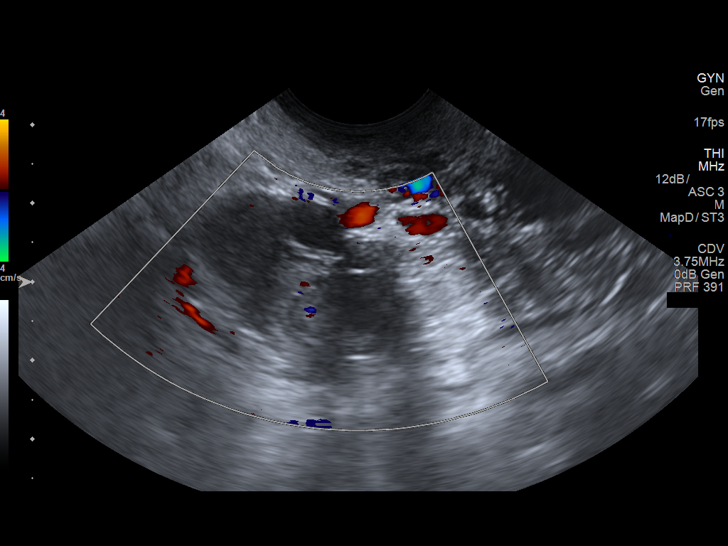
[im 56/61]
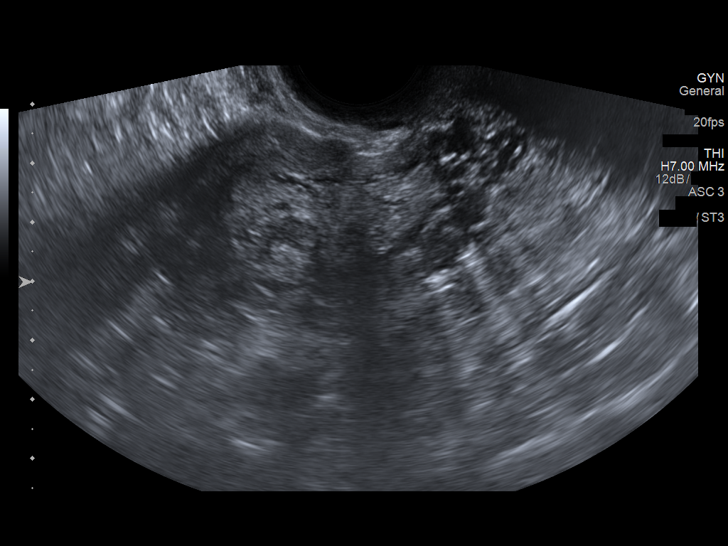
[im 61/61]
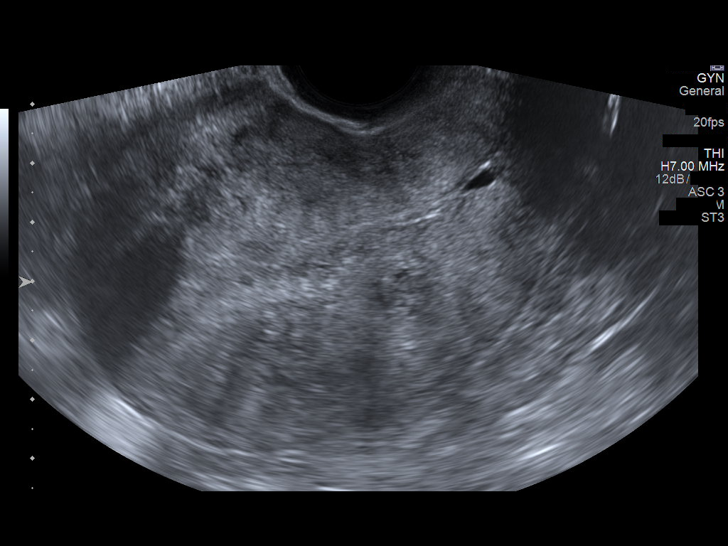

[13 of 25 positions shown; findings below may reference images not displayed]

FINDINGS: Uterus

Measurements: 10.3 x 6.3 x 7.3 cm. The uterus is heterogeneous.
There is a 11 x 10 x 9 mm posterior body intramural fibroid and a 9
x 10 x 6 mm lower uterine intramural fibroid.

Endometrium

Thickness: 16 mm. The endometrium is thickened and somewhat
heterogeneous. No abnormal focal endometrial vascularity. Per
history the patient is NOT postmenopausal and the endometrial
thickness may be related to secretory phase of the menstrual cycle
or related to small amount of blood product within the endometrium.
However if the patient is postmenopausal or continues to become
postmenopausal, this thickness of the endometrium is abnormal and
further evaluation with hysteroscopy is warranted.

Right ovary

Measurements: 2.7 x 1.6 x 1.6 cm. There is a 1.5 x 1.2 x 1.1 cm cyst
in the right ovary..

Left ovary

Measurements: 2.7 x 1.5 x 1.2 cm.. Normal appearance/no adnexal
mass.

Other findings

No abnormal free fluid.
IMPRESSION: 1. Heterogeneous uterus with small intramural fibroids.
2. Thickened endometrium may represent secretory phase menstrual
cycle or related to a small amount of blood product within the
endometrium. Clinical correlation and follow-up with ultrasound
recommended. If the patient is postmenopausal, further evaluation
with hysteroscopy is recommended.
3. Unremarkable ovaries.

## 2018-02-14 ENCOUNTER — Emergency Department (HOSPITAL_COMMUNITY): Payer: Self-pay

## 2018-02-14 ENCOUNTER — Emergency Department (HOSPITAL_COMMUNITY)
Admission: EM | Admit: 2018-02-14 | Discharge: 2018-02-14 | Disposition: A | Discharge status: Home / Self Care | Payer: Self-pay | Attending: Emergency Medicine | Admitting: Emergency Medicine

## 2018-02-14 ENCOUNTER — Encounter (HOSPITAL_COMMUNITY): Payer: Self-pay | Admitting: Emergency Medicine

## 2018-02-14 DIAGNOSIS — F17210 Nicotine dependence, cigarettes, uncomplicated: Secondary | ICD-10-CM | POA: Insufficient documentation

## 2018-02-14 DIAGNOSIS — R0989 Other specified symptoms and signs involving the circulatory and respiratory systems: Secondary | ICD-10-CM | POA: Insufficient documentation

## 2018-02-14 DIAGNOSIS — R110 Nausea: Secondary | ICD-10-CM | POA: Insufficient documentation

## 2018-02-14 DIAGNOSIS — I10 Essential (primary) hypertension: Secondary | ICD-10-CM | POA: Insufficient documentation

## 2018-02-14 DIAGNOSIS — M542 Cervicalgia: Secondary | ICD-10-CM | POA: Insufficient documentation

## 2018-02-14 DIAGNOSIS — R6889 Other general symptoms and signs: Secondary | ICD-10-CM

## 2018-02-14 DIAGNOSIS — H938X3 Other specified disorders of ear, bilateral: Secondary | ICD-10-CM | POA: Insufficient documentation

## 2018-02-14 DIAGNOSIS — Z7982 Long term (current) use of aspirin: Secondary | ICD-10-CM | POA: Insufficient documentation

## 2018-02-14 DIAGNOSIS — Z955 Presence of coronary angioplasty implant and graft: Secondary | ICD-10-CM | POA: Insufficient documentation

## 2018-02-14 DIAGNOSIS — Z7902 Long term (current) use of antithrombotics/antiplatelets: Secondary | ICD-10-CM | POA: Insufficient documentation

## 2018-02-14 DIAGNOSIS — Z79899 Other long term (current) drug therapy: Secondary | ICD-10-CM | POA: Insufficient documentation

## 2018-02-14 DIAGNOSIS — I2510 Atherosclerotic heart disease of native coronary artery without angina pectoris: Secondary | ICD-10-CM | POA: Insufficient documentation

## 2018-02-14 LAB — I-STAT TROPONIN, ED
TROPONIN I, POC: 0 ng/mL (ref 0.00–0.08)
TROPONIN I, POC: 0 ng/mL (ref 0.00–0.08)

## 2018-02-14 LAB — CBC
HCT: 30.9 % — ABNORMAL LOW (ref 36.0–46.0)
HEMOGLOBIN: 8.9 g/dL — AB (ref 12.0–15.0)
MCH: 23.4 pg — AB (ref 26.0–34.0)
MCHC: 28.8 g/dL — ABNORMAL LOW (ref 30.0–36.0)
MCV: 81.1 fL (ref 78.0–100.0)
PLATELETS: 305 10*3/uL (ref 150–400)
RBC: 3.81 MIL/uL — ABNORMAL LOW (ref 3.87–5.11)
RDW: 16.6 % — ABNORMAL HIGH (ref 11.5–15.5)
WBC: 7.2 10*3/uL (ref 4.0–10.5)

## 2018-02-14 LAB — BASIC METABOLIC PANEL
Anion gap: 11 (ref 5–15)
BUN: 21 mg/dL — ABNORMAL HIGH (ref 6–20)
CALCIUM: 8.9 mg/dL (ref 8.9–10.3)
CO2: 23 mmol/L (ref 22–32)
Chloride: 105 mmol/L (ref 101–111)
Creatinine, Ser: 1.04 mg/dL — ABNORMAL HIGH (ref 0.44–1.00)
GFR calc Af Amer: >60 mL/min (ref >60)
GFR calc non Af Amer: >60 mL/min (ref >60)
GLUCOSE: 139 mg/dL — AB (ref 65–99)
Potassium: 3.5 mmol/L (ref 3.5–5.1)
Sodium: 139 mmol/L (ref 135–145)

## 2018-02-14 LAB — I-STAT BETA HCG BLOOD, ED (MC, WL, AP ONLY): I-stat hCG, quantitative: <5 m[IU]/mL (ref <5)

## 2018-02-14 MED ORDER — ONDANSETRON HCL 4 MG/2ML IJ SOLN
4.0000 mg | Freq: Once | INTRAMUSCULAR | Status: AC
  Administered 2018-02-14: 4 mg via INTRAVENOUS
  Filled 2018-02-14: qty 2

## 2018-02-14 MED ORDER — DIPHENHYDRAMINE HCL 50 MG/ML IJ SOLN
12.5000 mg | Freq: Once | INTRAMUSCULAR | Status: AC
  Administered 2018-02-14: 12.5 mg via INTRAVENOUS
  Filled 2018-02-14: qty 1

## 2018-02-14 MED ORDER — GI COCKTAIL ~~LOC~~
30.0000 mL | Freq: Once | ORAL | Status: DC
  Filled 2018-02-14: qty 30

## 2018-02-14 MED ORDER — METOCLOPRAMIDE HCL 10 MG PO TABS: 10.0000 mg | ORAL_TABLET | Freq: Three times a day (TID) | ORAL | 0 refills | Status: DC | PRN

## 2018-02-14 MED ORDER — METOCLOPRAMIDE HCL 5 MG/ML IJ SOLN
10.0000 mg | Freq: Once | INTRAMUSCULAR | Status: AC
  Administered 2018-02-14: 10 mg via INTRAVENOUS
  Filled 2018-02-14: qty 2

## 2018-02-14 NOTE — ED Triage Notes (Signed)
Brought by ems from home for c/o chest pressure that goes into neck, dizziness, weakness, and n/v.  Given Asa 324mg  en route.  [id number].  Hx of MI with stents.
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
## 2018-02-14 NOTE — ED Notes (Signed)
Woke patient up to draw labs.  Still refusing to take GI cocktail.  Reports nausea relief.

## 2018-02-14 NOTE — Discharge Instructions (Signed)
Take the prescribed medication as directed for nausea. Follow-up with your primary care doctor.  I have printed your labs and attached on back for them to review. Return to the ED for new or worsening symptoms.

## 2018-03-22 DIAGNOSIS — M533 Sacrococcygeal disorders, not elsewhere classified: Secondary | ICD-10-CM | POA: Insufficient documentation

## 2018-04-06 DIAGNOSIS — M5136 Other intervertebral disc degeneration, lumbar region: Secondary | ICD-10-CM | POA: Insufficient documentation

## 2018-05-06 NOTE — Telephone Encounter (Signed)
Patient never picked up FMLA from 05/04/2017. This has been mailed to pt home address.

## 2018-09-07 ENCOUNTER — Emergency Department (HOSPITAL_COMMUNITY)
Admission: EM | Admit: 2018-09-07 | Discharge: 2018-09-08 | Disposition: A | Discharge status: Home / Self Care | Payer: Self-pay | Attending: Emergency Medicine | Admitting: Emergency Medicine

## 2018-09-07 ENCOUNTER — Other Ambulatory Visit: Payer: Self-pay

## 2018-09-07 ENCOUNTER — Encounter (HOSPITAL_COMMUNITY): Payer: Self-pay | Admitting: Emergency Medicine

## 2018-09-07 DIAGNOSIS — Y999 Unspecified external cause status: Secondary | ICD-10-CM | POA: Insufficient documentation

## 2018-09-07 DIAGNOSIS — I2510 Atherosclerotic heart disease of native coronary artery without angina pectoris: Secondary | ICD-10-CM | POA: Insufficient documentation

## 2018-09-07 DIAGNOSIS — X509XXA Other and unspecified overexertion or strenuous movements or postures, initial encounter: Secondary | ICD-10-CM | POA: Insufficient documentation

## 2018-09-07 DIAGNOSIS — I10 Essential (primary) hypertension: Secondary | ICD-10-CM | POA: Insufficient documentation

## 2018-09-07 DIAGNOSIS — G8929 Other chronic pain: Secondary | ICD-10-CM

## 2018-09-07 DIAGNOSIS — M5442 Lumbago with sciatica, left side: Secondary | ICD-10-CM | POA: Insufficient documentation

## 2018-09-07 DIAGNOSIS — Y939 Activity, unspecified: Secondary | ICD-10-CM | POA: Insufficient documentation

## 2018-09-07 DIAGNOSIS — F319 Bipolar disorder, unspecified: Secondary | ICD-10-CM | POA: Insufficient documentation

## 2018-09-07 DIAGNOSIS — M5441 Lumbago with sciatica, right side: Secondary | ICD-10-CM | POA: Insufficient documentation

## 2018-09-07 DIAGNOSIS — F17210 Nicotine dependence, cigarettes, uncomplicated: Secondary | ICD-10-CM | POA: Insufficient documentation

## 2018-09-07 DIAGNOSIS — Y929 Unspecified place or not applicable: Secondary | ICD-10-CM | POA: Insufficient documentation

## 2018-09-07 MED ORDER — KETOROLAC TROMETHAMINE 30 MG/ML IJ SOLN
30.0000 mg | Freq: Once | INTRAMUSCULAR | Status: AC
  Administered 2018-09-08: 30 mg via INTRAMUSCULAR
  Filled 2018-09-07: qty 1

## 2018-09-07 MED ORDER — GABAPENTIN 300 MG PO CAPS
300.0000 mg | ORAL_CAPSULE | Freq: Once | ORAL | Status: AC
  Administered 2018-09-08: 300 mg via ORAL
  Filled 2018-09-07: qty 1

## 2018-09-07 MED ORDER — DEXAMETHASONE SODIUM PHOSPHATE 10 MG/ML IJ SOLN
10.0000 mg | Freq: Once | INTRAMUSCULAR | Status: AC
  Administered 2018-09-08: 10 mg via INTRAMUSCULAR
  Filled 2018-09-07: qty 1

## 2018-09-07 NOTE — ED Triage Notes (Signed)
Pt arriving via [facility] for lower back pain that has been present since a fall in March. Pt states the pain increases with movement.

## 2018-09-07 NOTE — ED Notes (Signed)
Bed: WTR5 Expected date:  Expected time:  Means of arrival:  Comments: 

## 2018-09-08 MED ORDER — GABAPENTIN 300 MG PO CAPS: 300.0000 mg | ORAL_CAPSULE | Freq: Three times a day (TID) | ORAL | 0 refills | Status: DC

## 2018-09-08 NOTE — Progress Notes (Addendum)
CSW acknowledges consult for homeless issues. CSW covering remotely from another ED. CSW attempted to speak with pt's RN however RN not on unit at this time. CSW spoke with Lattie Haw Barista) to inform her that [facility] would fax over homeless resources in the [place] area for pt. CSW has faxed over shelter list, [place], and [place] options to [facility] ED at this time.    There are no further CSW needs at this time. CSW will sign off.    Virgie Dad. Evadna Donaghy, MSW, [facility] Clinical Social Worker [phone]
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
## 2018-09-09 ENCOUNTER — Encounter (HOSPITAL_COMMUNITY): Payer: Self-pay | Admitting: Emergency Medicine

## 2018-09-09 ENCOUNTER — Emergency Department (HOSPITAL_COMMUNITY)
Admission: EM | Admit: 2018-09-09 | Discharge: 2018-09-09 | Disposition: A | Discharge status: Home / Self Care | Payer: Worker's Compensation | Attending: Emergency Medicine | Admitting: Emergency Medicine

## 2018-09-09 ENCOUNTER — Other Ambulatory Visit: Payer: Self-pay

## 2018-09-09 DIAGNOSIS — M5442 Lumbago with sciatica, left side: Secondary | ICD-10-CM | POA: Diagnosis not present

## 2018-09-09 DIAGNOSIS — G8929 Other chronic pain: Secondary | ICD-10-CM

## 2018-09-09 DIAGNOSIS — Z955 Presence of coronary angioplasty implant and graft: Secondary | ICD-10-CM | POA: Diagnosis not present

## 2018-09-09 DIAGNOSIS — I10 Essential (primary) hypertension: Secondary | ICD-10-CM | POA: Diagnosis not present

## 2018-09-09 DIAGNOSIS — M5441 Lumbago with sciatica, right side: Secondary | ICD-10-CM | POA: Diagnosis not present

## 2018-09-09 DIAGNOSIS — I252 Old myocardial infarction: Secondary | ICD-10-CM | POA: Insufficient documentation

## 2018-09-09 DIAGNOSIS — I2510 Atherosclerotic heart disease of native coronary artery without angina pectoris: Secondary | ICD-10-CM | POA: Diagnosis not present

## 2018-09-09 DIAGNOSIS — M545 Low back pain: Secondary | ICD-10-CM | POA: Diagnosis present

## 2018-09-09 DIAGNOSIS — F17210 Nicotine dependence, cigarettes, uncomplicated: Secondary | ICD-10-CM | POA: Insufficient documentation

## 2018-09-09 MED ORDER — DIAZEPAM 2 MG PO TABS
2.0000 mg | ORAL_TABLET | Freq: Once | ORAL | Status: AC
  Administered 2018-09-09: 2 mg via ORAL
  Filled 2018-09-09: qty 1

## 2018-09-09 MED ORDER — LIDOCAINE 5 % EX PTCH
1.0000 | MEDICATED_PATCH | CUTANEOUS | Status: DC
  Administered 2018-09-09: 1 via TRANSDERMAL
  Filled 2018-09-09: qty 1

## 2018-09-09 MED ORDER — IBUPROFEN 400 MG PO TABS
600.0000 mg | ORAL_TABLET | Freq: Once | ORAL | Status: AC
  Administered 2018-09-09: 600 mg via ORAL
  Filled 2018-09-09: qty 1

## 2018-09-09 MED ORDER — PROMETHAZINE HCL 25 MG PO TABS
12.5000 mg | ORAL_TABLET | Freq: Once | ORAL | Status: AC
  Administered 2018-09-09: 12.5 mg via ORAL
  Filled 2018-09-09: qty 1

## 2018-09-09 MED ORDER — MORPHINE SULFATE (PF) 4 MG/ML IV SOLN
4.0000 mg | Freq: Once | INTRAVENOUS | Status: AC
  Administered 2018-09-09: 4 mg via INTRAMUSCULAR
  Filled 2018-09-09: qty 1
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
## 2018-09-09 NOTE — Discharge Instructions (Signed)
Please follow up with your doctor regarding your back Return if you are worsening

## 2018-09-09 NOTE — ED Triage Notes (Signed)
Per EMS pt from home was picked up at [facility], chronic lower back pain, pain shoots down. Denies urinary symptoms

## 2018-09-25 ENCOUNTER — Emergency Department (HOSPITAL_COMMUNITY)
Admission: EM | Admit: 2018-09-25 | Discharge: 2018-09-25 | Disposition: A | Discharge status: Home / Self Care | Payer: Self-pay | Attending: Emergency Medicine | Admitting: Emergency Medicine

## 2018-09-25 ENCOUNTER — Encounter (HOSPITAL_COMMUNITY): Payer: Self-pay | Admitting: Nurse Practitioner

## 2018-09-25 DIAGNOSIS — F17210 Nicotine dependence, cigarettes, uncomplicated: Secondary | ICD-10-CM | POA: Insufficient documentation

## 2018-09-25 DIAGNOSIS — I2510 Atherosclerotic heart disease of native coronary artery without angina pectoris: Secondary | ICD-10-CM | POA: Insufficient documentation

## 2018-09-25 DIAGNOSIS — M5442 Lumbago with sciatica, left side: Secondary | ICD-10-CM | POA: Insufficient documentation

## 2018-09-25 DIAGNOSIS — Z955 Presence of coronary angioplasty implant and graft: Secondary | ICD-10-CM | POA: Insufficient documentation

## 2018-09-25 DIAGNOSIS — G8929 Other chronic pain: Secondary | ICD-10-CM | POA: Insufficient documentation

## 2018-09-25 DIAGNOSIS — I10 Essential (primary) hypertension: Secondary | ICD-10-CM | POA: Insufficient documentation

## 2018-09-25 DIAGNOSIS — M5441 Lumbago with sciatica, right side: Secondary | ICD-10-CM | POA: Insufficient documentation

## 2018-09-25 MED ORDER — ACETAMINOPHEN 500 MG PO TABS: 500.0000 mg | ORAL_TABLET | Freq: Four times a day (QID) | ORAL | 0 refills | Status: DC | PRN

## 2018-09-25 MED ORDER — PROMETHAZINE HCL 25 MG PO TABS
12.5000 mg | ORAL_TABLET | Freq: Once | ORAL | Status: AC
  Administered 2018-09-25: 12.5 mg via ORAL
  Filled 2018-09-25: qty 1

## 2018-09-25 MED ORDER — IBUPROFEN 600 MG PO TABS: 600.0000 mg | ORAL_TABLET | Freq: Four times a day (QID) | ORAL | 0 refills | Status: DC | PRN

## 2018-09-25 MED ORDER — MORPHINE SULFATE (PF) 4 MG/ML IV SOLN
4.0000 mg | Freq: Once | INTRAVENOUS | Status: AC
  Administered 2018-09-25: 4 mg via INTRAMUSCULAR
  Filled 2018-09-25: qty 1

## 2018-09-25 MED ORDER — GABAPENTIN 300 MG PO CAPS: 300.0000 mg | ORAL_CAPSULE | Freq: Three times a day (TID) | ORAL | 0 refills | Status: DC

## 2018-09-25 MED ORDER — IBUPROFEN 200 MG PO TABS
600.0000 mg | ORAL_TABLET | Freq: Once | ORAL | Status: AC
  Administered 2018-09-25: 600 mg via ORAL
  Filled 2018-09-25: qty 3

## 2018-09-25 MED ORDER — DEXAMETHASONE SODIUM PHOSPHATE 10 MG/ML IJ SOLN
10.0000 mg | Freq: Once | INTRAMUSCULAR | Status: AC
  Administered 2018-09-25: 10 mg via INTRAMUSCULAR
  Filled 2018-09-25: qty 1

## 2018-09-25 MED ORDER — HYDROXYZINE HCL 25 MG PO TABS: 25.0000 mg | ORAL_TABLET | Freq: Four times a day (QID) | ORAL | 0 refills | Status: DC | PRN

## 2018-09-25 MED ORDER — DIAZEPAM 2 MG PO TABS
2.0000 mg | ORAL_TABLET | Freq: Once | ORAL | Status: AC
  Administered 2018-09-25: 2 mg via ORAL
  Filled 2018-09-25: qty 1
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
## 2018-09-25 NOTE — ED Triage Notes (Signed)
Pt is c/o chronic back pain, requesting to be evaluated.

## 2018-09-25 NOTE — Discharge Instructions (Addendum)
Begin taking gabapentin as prescribed.  You can take ibuprofen and Tylenol alternating as prescribed for your pain.  Use ice alternating 20 minutes on, 20 minutes off.  Please follow-up with your spine doctor for further management.  Take Vistaril every 6 hours as needed for anxiety.  You can follow-up with you with resources below for further management of your anxiety and panic attacks.  Please establish care with a primary care provider at the [facility] for further management as well as recheck your blood pressure, as it has been elevated today.  Please return the emergency department if you develop any new or worsening symptoms.

## 2018-09-25 NOTE — ED Notes (Addendum)
Pt is ambulating without assistance. States her pain is now 4 out of 10. Verbalizes discharge instructions and follow up care. Oxygen levels are stable compared to baseline.

## 2018-10-04 ENCOUNTER — Other Ambulatory Visit: Payer: Self-pay

## 2018-10-04 ENCOUNTER — Emergency Department (HOSPITAL_COMMUNITY): Payer: Self-pay

## 2018-10-04 ENCOUNTER — Encounter (HOSPITAL_COMMUNITY): Payer: Self-pay | Admitting: Emergency Medicine

## 2018-10-04 ENCOUNTER — Emergency Department (HOSPITAL_COMMUNITY)
Admission: EM | Admit: 2018-10-04 | Discharge: 2018-10-04 | Disposition: A | Discharge status: Home / Self Care | Payer: Self-pay | Attending: Emergency Medicine | Admitting: Emergency Medicine

## 2018-10-04 DIAGNOSIS — Z79899 Other long term (current) drug therapy: Secondary | ICD-10-CM | POA: Insufficient documentation

## 2018-10-04 DIAGNOSIS — F17210 Nicotine dependence, cigarettes, uncomplicated: Secondary | ICD-10-CM | POA: Insufficient documentation

## 2018-10-04 DIAGNOSIS — R45851 Suicidal ideations: Secondary | ICD-10-CM | POA: Insufficient documentation

## 2018-10-04 DIAGNOSIS — I10 Essential (primary) hypertension: Secondary | ICD-10-CM | POA: Insufficient documentation

## 2018-10-04 DIAGNOSIS — F314 Bipolar disorder, current episode depressed, severe, without psychotic features: Secondary | ICD-10-CM | POA: Insufficient documentation

## 2018-10-04 DIAGNOSIS — I2510 Atherosclerotic heart disease of native coronary artery without angina pectoris: Secondary | ICD-10-CM | POA: Insufficient documentation

## 2018-10-04 LAB — COMPREHENSIVE METABOLIC PANEL
ALBUMIN: 3.5 g/dL (ref 3.5–5.0)
ALK PHOS: 112 U/L (ref 38–126)
ALT: 26 U/L (ref 0–44)
ANION GAP: 8 (ref 5–15)
AST: 18 U/L (ref 15–41)
BUN: 11 mg/dL (ref 6–20)
CALCIUM: 9.1 mg/dL (ref 8.9–10.3)
CO2: 26 mmol/L (ref 22–32)
CREATININE: 0.82 mg/dL (ref 0.44–1.00)
Chloride: 105 mmol/L (ref 98–111)
GFR calc Af Amer: >60 mL/min (ref >60)
GFR calc non Af Amer: >60 mL/min (ref >60)
GLUCOSE: 194 mg/dL — AB (ref 70–99)
Potassium: 3.5 mmol/L (ref 3.5–5.1)
SODIUM: 139 mmol/L (ref 135–145)
Total Bilirubin: 0.5 mg/dL (ref 0.3–1.2)
Total Protein: 7.3 g/dL (ref 6.5–8.1)

## 2018-10-04 LAB — CBC WITH DIFFERENTIAL/PLATELET
ABS IMMATURE GRANULOCYTES: 0.03 10*3/uL (ref 0.00–0.07)
BASOS ABS: 0 10*3/uL (ref 0.0–0.1)
Basophils Relative: 0 %
Eosinophils Absolute: 0.2 10*3/uL (ref 0.0–0.5)
Eosinophils Relative: 2 %
HCT: 44.5 % (ref 36.0–46.0)
HEMOGLOBIN: 13.6 g/dL (ref 12.0–15.0)
IMMATURE GRANULOCYTES: 0 %
LYMPHS PCT: 21 %
Lymphs Abs: 1.8 10*3/uL (ref 0.7–4.0)
MCH: 26.4 pg (ref 26.0–34.0)
MCHC: 30.6 g/dL (ref 30.0–36.0)
MCV: 86.4 fL (ref 80.0–100.0)
Monocytes Absolute: 0.6 10*3/uL (ref 0.1–1.0)
Monocytes Relative: 6 %
NEUTROS ABS: 6.3 10*3/uL (ref 1.7–7.7)
NRBC: 0 % (ref 0.0–0.2)
Neutrophils Relative %: 71 %
Platelets: 270 10*3/uL (ref 150–400)
RBC: 5.15 MIL/uL — AB (ref 3.87–5.11)
RDW: 14 % (ref 11.5–15.5)
WBC: 8.9 10*3/uL (ref 4.0–10.5)

## 2018-10-04 LAB — RAPID URINE DRUG SCREEN, HOSP PERFORMED
AMPHETAMINES: NOT DETECTED
Barbiturates: NOT DETECTED
Benzodiazepines: NOT DETECTED
Cocaine: NOT DETECTED
OPIATES: NOT DETECTED
TETRAHYDROCANNABINOL: NOT DETECTED

## 2018-10-04 LAB — TROPONIN I: Troponin I: <0.03 ng/mL (ref <0.03)

## 2018-10-04 LAB — HCG, QUANTITATIVE, PREGNANCY: hCG, Beta Chain, Quant, S: 2 m[IU]/mL (ref <5)

## 2018-10-04 LAB — SALICYLATE LEVEL

## 2018-10-04 LAB — LIPASE, BLOOD: Lipase: 39 U/L (ref 11–51)

## 2018-10-04 LAB — ETHANOL: Alcohol, Ethyl (B): <10 mg/dL (ref <10)

## 2018-10-04 LAB — ACETAMINOPHEN LEVEL: Acetaminophen (Tylenol), Serum: <10 ug/mL — ABNORMAL LOW (ref 10–30)

## 2018-10-04 MED ORDER — LIDOCAINE VISCOUS HCL 2 % MT SOLN
15.0000 mL | Freq: Once | OROMUCOSAL | Status: AC
  Administered 2018-10-04: 15 mL via ORAL
  Filled 2018-10-04: qty 15

## 2018-10-04 MED ORDER — ACETAMINOPHEN 500 MG PO TABS: 500.0000 mg | ORAL_TABLET | Freq: Four times a day (QID) | ORAL | Status: DC | PRN

## 2018-10-04 MED ORDER — ALBUTEROL SULFATE HFA 108 (90 BASE) MCG/ACT IN AERS: 2.0000 | INHALATION_SPRAY | Freq: Four times a day (QID) | RESPIRATORY_TRACT | Status: DC | PRN

## 2018-10-04 MED ORDER — HYDROXYZINE HCL 25 MG PO TABS: 25.0000 mg | ORAL_TABLET | Freq: Four times a day (QID) | ORAL | Status: DC | PRN

## 2018-10-04 MED ORDER — ALUM & MAG HYDROXIDE-SIMETH 200-200-20 MG/5ML PO SUSP
30.0000 mL | Freq: Once | ORAL | Status: AC
  Administered 2018-10-04: 30 mL via ORAL
  Filled 2018-10-04: qty 30

## 2018-10-04 MED ORDER — IBUPROFEN 400 MG PO TABS: 600.0000 mg | ORAL_TABLET | Freq: Four times a day (QID) | ORAL | Status: DC | PRN
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
## 2018-10-04 NOTE — ED Notes (Signed)
Pt became tearful at discharge.  Reporting she has no where to go.  Phone call complete to SW, made aware of the Pt's current status.  SW reports she will come see Pt.
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
## 2018-10-04 NOTE — ED Notes (Signed)
TTS in progress 
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
## 2018-10-04 NOTE — ED Triage Notes (Signed)
Pt homeless, brought in by [employer]. States she is having thoughts of harming herself, because of her back pain, inability to work and not being accepted into a shelter last night. Denies any plan to harm self or any AH/VH. BP high - 197/100, noncompliant with meds x 1 yr

## 2018-10-04 NOTE — ED Notes (Signed)
Patient  Has been wanded by [facility]

## 2018-10-04 NOTE — Discharge Instructions (Addendum)
Please read attached information. If you experience any new or worsening signs or symptoms please return to the emergency room for evaluation. Please follow-up with your primary care provider or specialist as discussed.  °

## 2018-10-04 NOTE — ED Provider Notes (Signed)
  52 year old female with suicidal ideation presenting to the emergency room.  Please see previous providers note for full H&P.  No significant medical changes overnight, patient awaiting behavioral health evaluation.  Patient notes minor epigastric abdominal pain for several days.  No nausea vomiting or fever.  Physical Exam  BP 132/71 (BP Location: Right Arm)   Pulse 80   Temp 98.2 F (36.8 C) (Oral)   Resp 16   Wt 99.8 kg   SpO2 99%   BMI 37.76 kg/m   Physical Exam   Soft nontender abdomen  ED Course/Procedures     Procedures  MDM   52 year old female presents today with suicidal ideations.  Resting comfortably in exam bed no acute distress, minimal epigastric pain labs reviewed no significant abnormalities, no systemic symptoms.  Patient is awaiting behavioral health assessment and disposition.      Okey Regal, PA-C 10/04/18 9417    Gareth Morgan, MD 10/05/18 [phone]

## 2018-10-04 NOTE — ED Notes (Signed)
Belongings returned, 4bags, wrench and lighter from security

## 2018-10-04 NOTE — ED Notes (Signed)
Emma Turner,

## 2018-10-04 NOTE — Consult Note (Signed)
  Tele Assessment   Emma Turner, 52 y.o., female patient presented to [facility] with complaints of suicidal ideation passive in nature ""I would not car if the world would end"' "I a house fell on me I wouldn't care".  No specific plan or intent noted.  Patient seen via telepsych by this provider; chart reviewed and consulted with Dr. Dwyane Dee on 10/04/18.  On evaluation Emma Turner asked what happened to bring her to hospital "I don't know"  Patient denies suicidal/self-harm/homicidal ideation, psychosis, and paranoia.  Patient states "I've been asked these questions before." irritated about questions and having to be prompted several times to answer.  Patient did state that she slept well last night; somewhat decreased appetite this morning related to stomach cramps.  Patient denies any outpatient psychiatric services or psychotropic medications.    During evaluation [name] is alert/oriented x 3; calm/cooperative; and mood congruent with affect.  She does not appear to be responding to internal/external stimuli or delusional thoughts.  Patient denied suicidal/self-harm/homicidal ideation, psychosis, and paranoia.  Patient answered question appropriately.  Recommendations:  Referral/resources for outpatient psychiatric services  Disposition:  Patient psychiatrically cleared No evidence of imminent risk to self or others at present.   Patient does not meet criteria for psychiatric inpatient admission. Supportive therapy provided about ongoing stressors. Discussed crisis plan, support from social network, calling 911, coming to the Emergency Department, and calling Suicide Hotline.  Spoke with Lenn Sink, PA; informed of above recommendations and disposition Earleen Newport, NP

## 2018-10-06 ENCOUNTER — Encounter (HOSPITAL_COMMUNITY): Payer: Self-pay | Admitting: *Deleted

## 2018-10-06 ENCOUNTER — Emergency Department (HOSPITAL_COMMUNITY)
Admission: EM | Admit: 2018-10-06 | Discharge: 2018-10-06 | Disposition: A | Discharge status: Home / Self Care | Payer: Medicaid Other | Attending: Emergency Medicine | Admitting: Emergency Medicine

## 2018-10-06 DIAGNOSIS — F17210 Nicotine dependence, cigarettes, uncomplicated: Secondary | ICD-10-CM | POA: Insufficient documentation

## 2018-10-06 DIAGNOSIS — Z0441 Encounter for examination and observation following alleged adult rape: Secondary | ICD-10-CM | POA: Insufficient documentation

## 2018-10-06 DIAGNOSIS — Z590 Homelessness: Secondary | ICD-10-CM | POA: Insufficient documentation

## 2018-10-06 DIAGNOSIS — I2510 Atherosclerotic heart disease of native coronary artery without angina pectoris: Secondary | ICD-10-CM | POA: Insufficient documentation

## 2018-10-06 DIAGNOSIS — I10 Essential (primary) hypertension: Secondary | ICD-10-CM | POA: Insufficient documentation

## 2018-10-06 DIAGNOSIS — Z79899 Other long term (current) drug therapy: Secondary | ICD-10-CM | POA: Insufficient documentation

## 2018-10-06 LAB — POC URINE PREG, ED: PREG TEST UR: NEGATIVE

## 2018-10-06 MED ORDER — CEFTRIAXONE SODIUM 250 MG IJ SOLR
250.0000 mg | Freq: Once | INTRAMUSCULAR | Status: AC
  Administered 2018-10-06: 250 mg via INTRAMUSCULAR

## 2018-10-06 MED ORDER — HYDROXYZINE HCL 10 MG PO TABS
10.0000 mg | ORAL_TABLET | Freq: Once | ORAL | Status: AC
  Administered 2018-10-06: 10 mg via ORAL
  Filled 2018-10-06: qty 1

## 2018-10-06 MED ORDER — LIDOCAINE HCL (PF) 1 % IJ SOLN
0.9000 mL | Freq: Once | INTRAMUSCULAR | Status: AC
  Administered 2018-10-06: 0.9 mL

## 2018-10-06 MED ORDER — METRONIDAZOLE 500 MG PO TABS
2000.0000 mg | ORAL_TABLET | Freq: Once | ORAL | Status: AC
  Administered 2018-10-06: 2000 mg via ORAL

## 2018-10-06 MED ORDER — AZITHROMYCIN 250 MG PO TABS
1000.0000 mg | ORAL_TABLET | Freq: Once | ORAL | Status: AC
  Administered 2018-10-06: 1000 mg via ORAL
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
## 2018-10-06 NOTE — Discharge Instructions (Addendum)
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 -thioridazine -ziprasidone This medicine may also interact with the following medications: -certain medicines for depression, anxiety, or psychotic disturbances -certain medicines for anxiety or sleep -certain medicines for seizures like carbamazepine, phenobarbital, phenytoin -certain medicines for movement abnormalities as in Parkinson's disease, or for gastrointestinal problems -epinephrine -medicines for allergies or colds -muscle relaxants -narcotic medicines for pain -other medicines that prolong the QT interval (cause an abnormal heart rhythm) -tramadol -trimethobenzamide This list may not describe all possible interactions. Give your health care provider a list of all the medicines, herbs,  non-prescription drugs, or dietary supplements you use. Also tell them if you smoke, drink alcohol, or use  illegal drugs. Some items may interact with your medicine. What should I watch for while using this medicine? Tell your doctor or health care professional if your symptoms do not start to get better in 1 to 2 days. You may get drowsy or dizzy. Do not drive, use machinery, or do anything that needs mental alertness until you know how this medicine affects you. To reduce the risk of dizzy or fainting spells, do not stand or sit up quickly, especially if you are an older patient. Alcohol may increase dizziness and drowsiness. Avoid alcoholic drinks. Your mouth may get dry. Chewing sugarless gum or sucking hard candy, and drinking plenty of water may help. Contact your doctor if the problem does not go away or is severe. This medicine may cause dry eyes and blurred vision. If you wear contact lenses you may feel some discomfort. Lubricating drops may help. See your eye doctor if the problem does not go away or is severe. This medicine can make you more sensitive to the sun. Keep out of the sun. If you cannot avoid being in the sun, wear protective clothing and use sunscreen. Do not use sun lamps or tanning beds/booths. If you are diabetic, check your blood-sugar levels regularly. What side effects may I notice from receiving this medicine? Side effects that you should report to your doctor or health care professional as soon as possible: -blurred vision -irregular heartbeat, palpitations or chest pain -muscle or facial twitches -pain or difficulty passing urine -seizures -skin rash -slowed or shallow breathing -unusual bleeding or bruising -yellowing of the eyes or skin Side effects that usually do not require medical attention (report to your doctor or health care professional if they continue or are bothersome): -headache -nightmares, agitation, nervousness, excitability, not able to sleep (these are more likely in children) -stuffy nose This list may not describe all possible side effects. Call  your doctor for medical advice about side effects. You may report side effects to FDA at 1-800-FDA-1088. Where should I keep my medicine? Keep out of the reach of children. Store at room temperature, between 20 and 25 degrees C (68 and 77 degrees F). Protect from light. Throw away any unused medicine after the expiration date. NOTE: This sheet is a summary. It may not cover all possible information. If you have questions about this medicine, talk to your doctor, pharmacist, or health care provider.  2017 Elsevier/Gold Standard (2013-07-04 15:04:46)   Ceftriaxone (Injection/Shot) Also known as:  Rocephin  Ceftriaxone injection What is this medicine? CEFTRIAXONE (sef try AX one) is a cephalosporin antibiotic. It is used to treat certain kinds of bacterial infections. It will not work for colds, flu, or other viral infections. This medicine may be used for other purposes; ask your health care provider or pharmacist if you have questions. COMMON BRAND NAME(S): Rocephin What should I tell my health care provider before I take this medicine? They need to know if you have any of these conditions: -any chronic illness -bowel disease, like colitis -both kidney and liver disease -high bilirubin level in newborn patients -an unusual or allergic reaction to ceftriaxone, other cephalosporin or penicillin antibiotics, foods, dyes, or preservatives -pregnant or trying to get pregnant -breast-feeding How should I use this medicine? This medicine is injected into a muscle or infused it into a vein. It is usually given in a medical office or clinic. If you are  to give this medicine you will be taught how to inject it. Follow instructions carefully. Use your doses at regular intervals. Do not take your medicine more often than directed. Do not skip doses or stop your medicine early even if you feel better. Do not stop taking except on your doctor's advice. Talk to your pediatrician regarding the use of this  medicine in children. Special care may be needed. Overdosage: If you think you have taken too much of this medicine contact a poison control center or emergency room at once. NOTE: This medicine is only for you. Do not share this medicine with others. What if I miss a dose? If you miss a dose, take it as soon as you can. If it is almost time for your next dose, take only that dose. Do not take double or extra doses. What may interact with this medicine? Do not take this medicine with any of the following medications: -intravenous calcium This medicine may also interact with the following medications: -birth control pills This list may not describe all possible interactions. Give your health care provider a list of all the medicines, herbs, non-prescription drugs, or dietary supplements you use. Also tell them if you smoke, drink alcohol, or use illegal drugs. Some items may interact with your medicine. What should I watch for while using this medicine? Tell your doctor or health care professional if your symptoms do not improve or if they get worse. Do not treat diarrhea with over the counter products. Contact your doctor if you have diarrhea that lasts more than 2 days or if it is severe and watery. If you are being treated for a sexually transmitted disease, avoid sexual contact until you have finished your treatment. Having sex can infect your sexual partner. Calcium may bind to this medicine and cause lung or kidney problems. Avoid calcium products while taking this medicine and for 48 hours after taking the last dose of this medicine. What side effects may I notice from receiving this medicine? Side effects that you should report to your doctor or health care professional as soon as possible: -allergic reactions like skin rash, itching or hives, swelling of the face, lips, or tongue -breathing problems -fever, chills -irregular heartbeat -pain when passing urine -seizures -stomach pain,  cramps -unusual bleeding, bruising -unusually weak or tired Side effects that usually do not require medical attention (report to your doctor or health care professional if they continue or are bothersome): -diarrhea -dizzy, drowsy -headache -nausea, vomiting -pain, swelling, irritation where injected -stomach upset -sweating This list may not describe all possible side effects. Call your doctor for medical advice about side effects. You may report side effects to FDA at 1-800-FDA-1088. Where should I keep my medicine? Keep out of the reach of children. Store at room temperature below 25 degrees C (77 degrees F). Protect from light. Throw away any unused vials after the expiration date. NOTE: This sheet is a summary. It may not cover all possible information. If you have questions about this medicine, talk to your doctor, pharmacist, or health care provider.  2017 Elsevier/Gold Standard (2014-05-21 09:14:54)
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
## 2019-05-10 IMAGING — CR DG CHEST 2V
2 series · 2 of 2 positions shown · non-contrast
Comparison: 04/18/2017

CLINICAL DATA: Mid sternal chest pain

EXAM:
CHEST  2 VIEW

[chest pa]
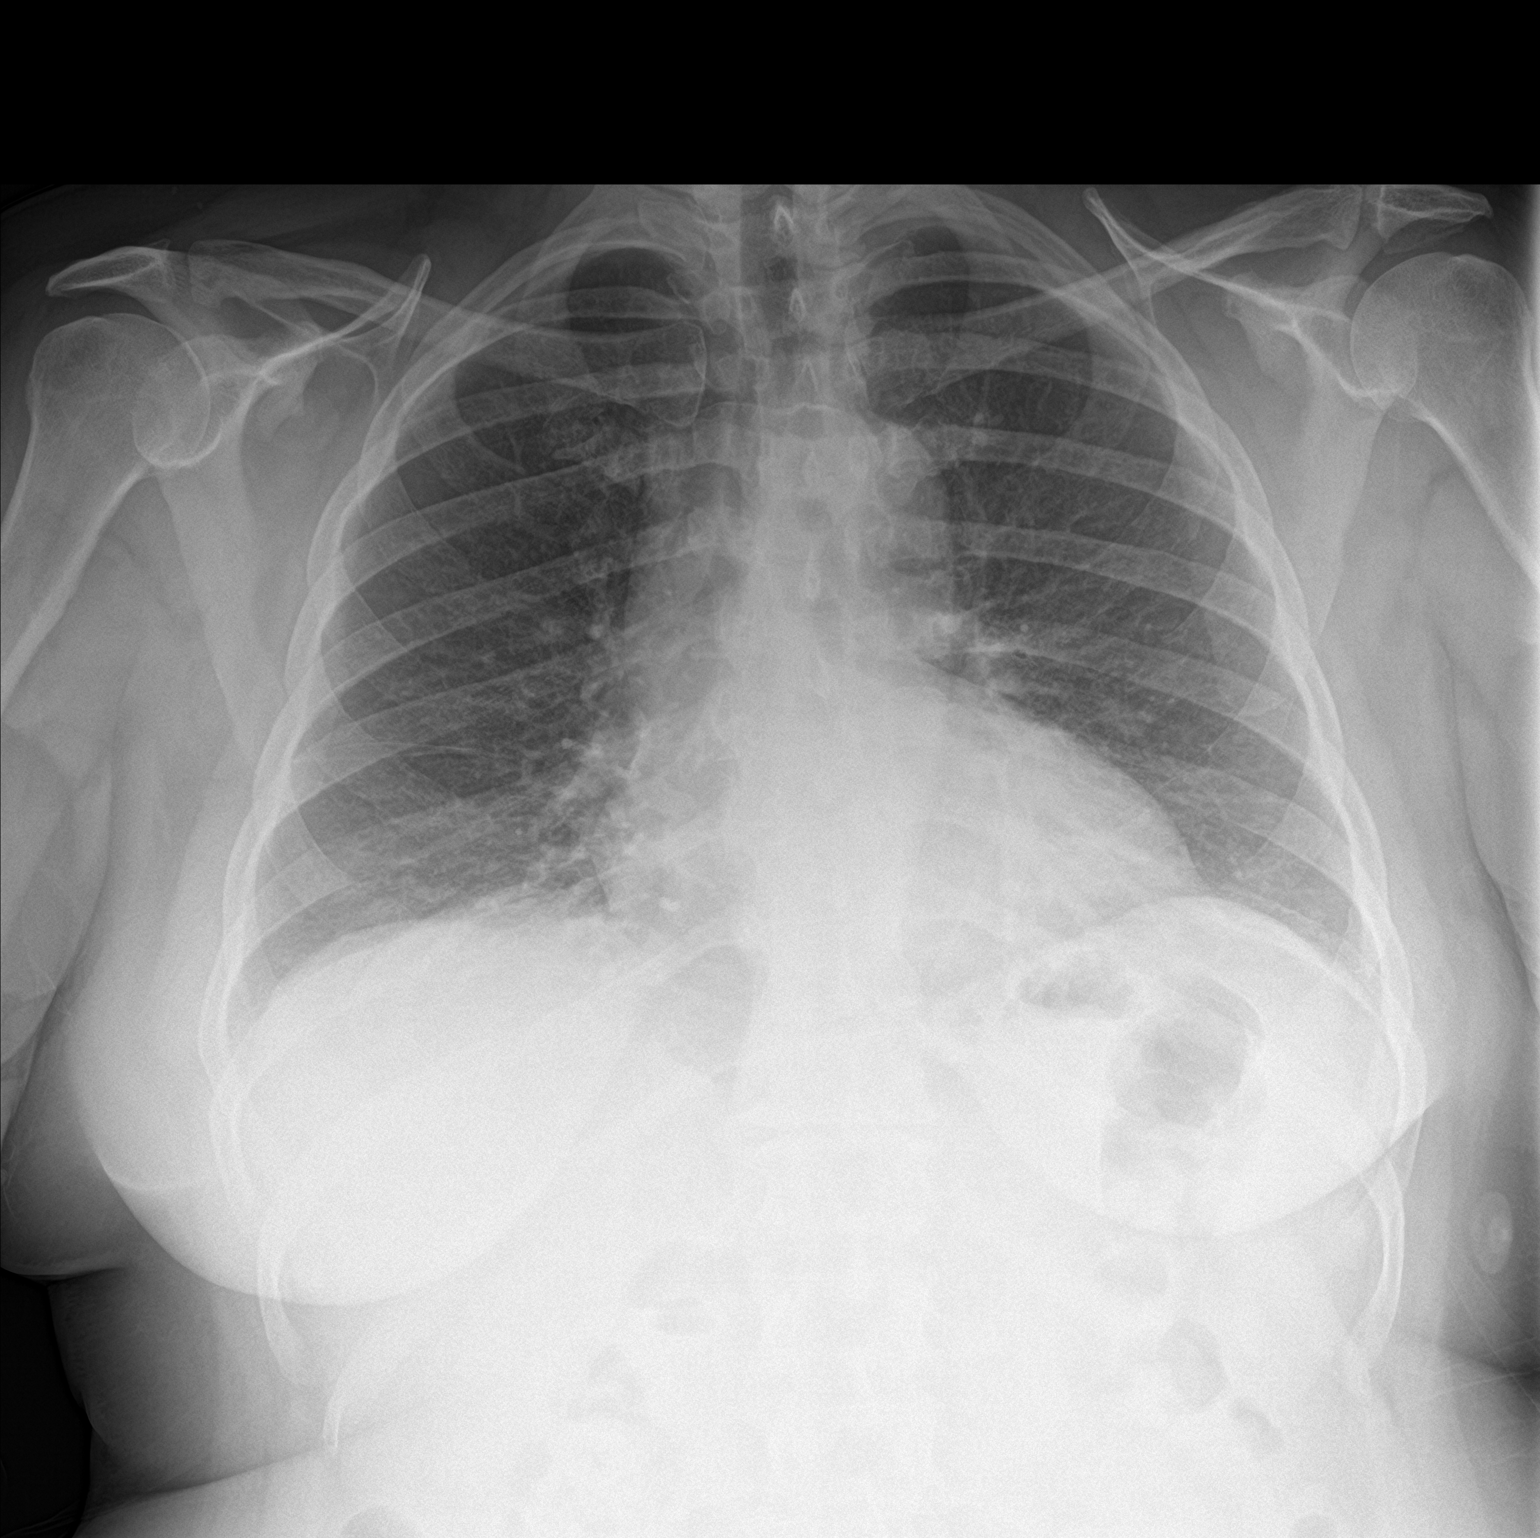

[chest lat]
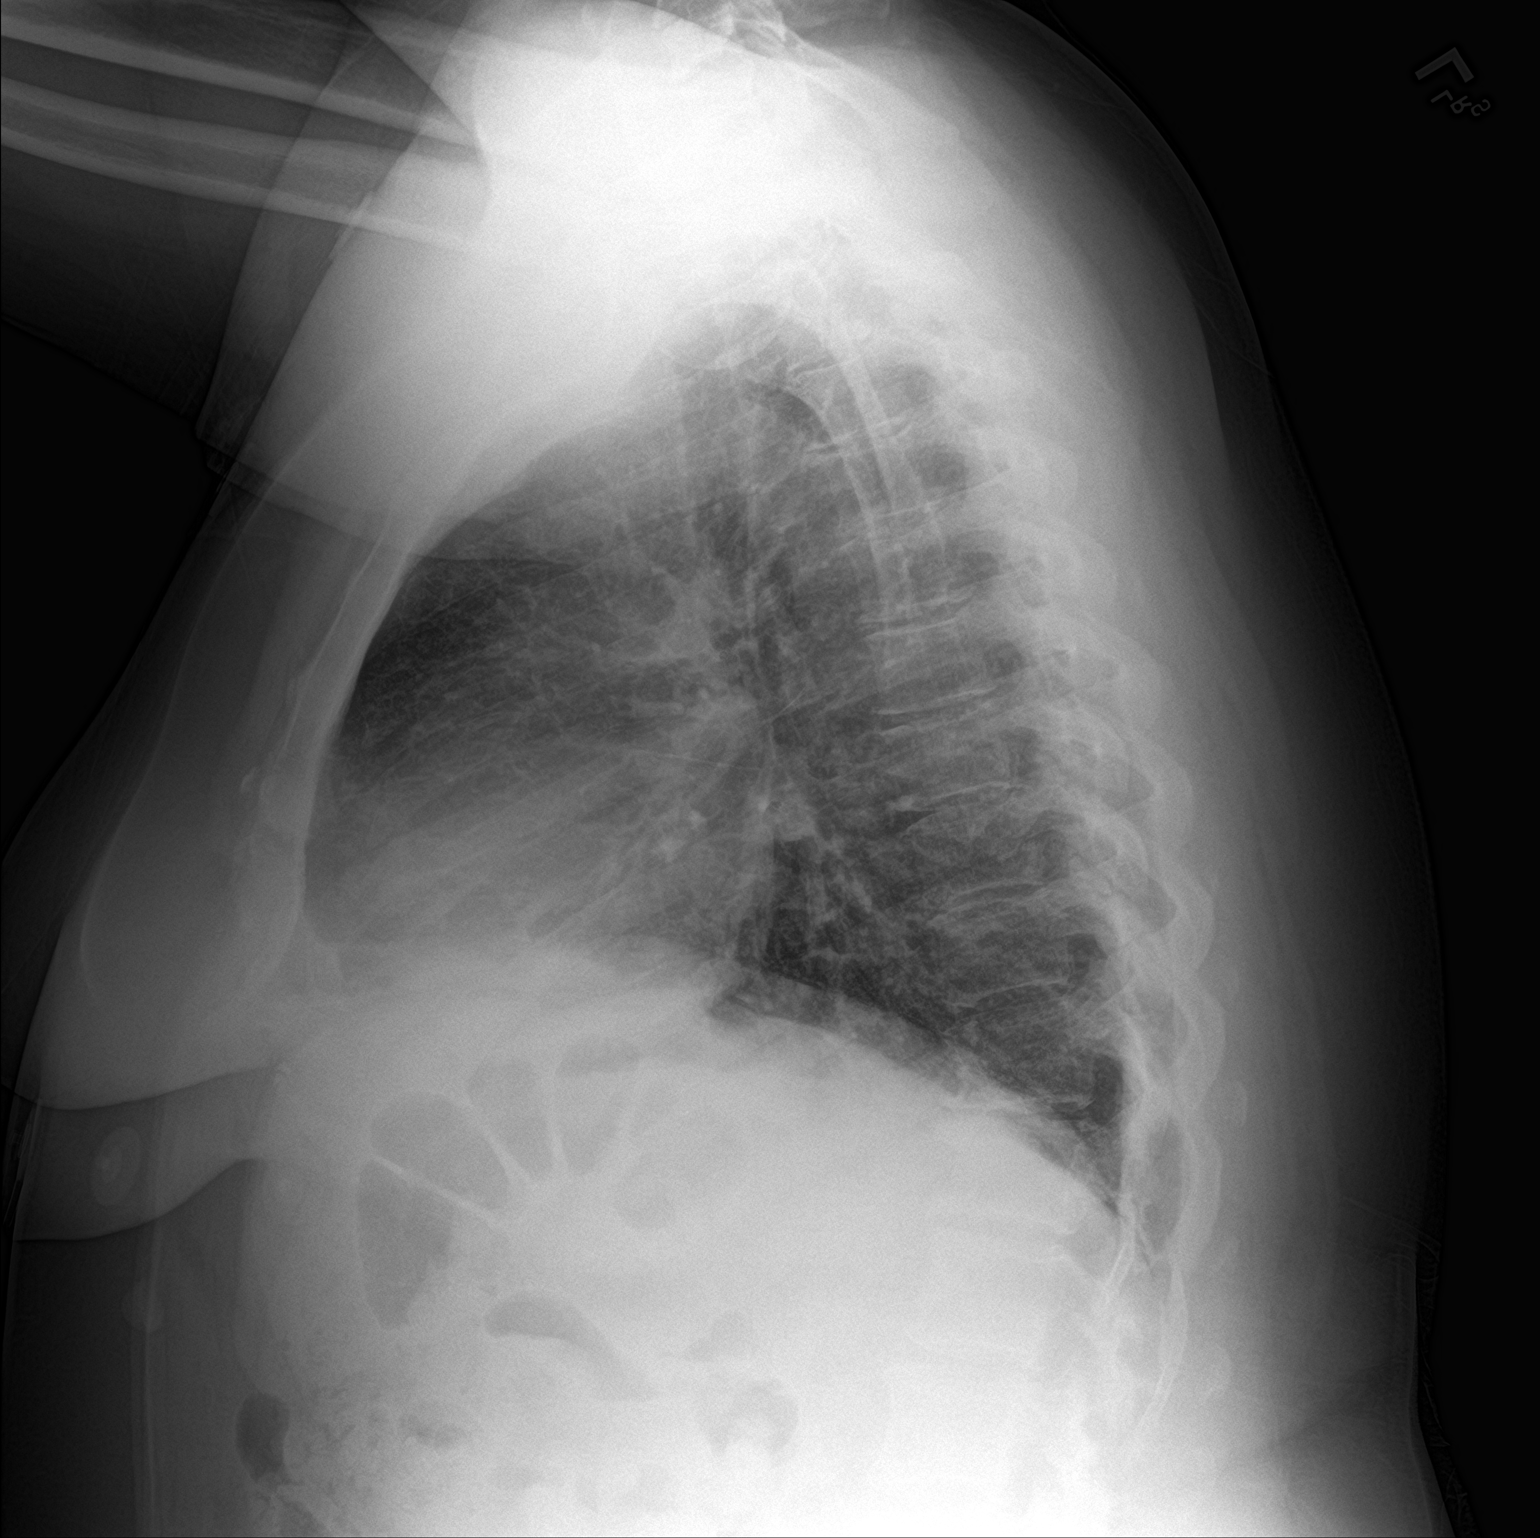

[2 of 2 positions shown; findings below may reference images not displayed]

FINDINGS: Minimal bibasilar atelectasis. No focal consolidation or effusion.
Borderline cardiomegaly. No pneumothorax.
IMPRESSION: Borderline cardiomegaly with minimal bibasilar atelectasis.

## 2021-11-04 ENCOUNTER — Inpatient Hospital Stay (HOSPITAL_COMMUNITY)
Admission: EM | Admit: 2021-11-04 | Discharge: 2021-11-05 | DRG: 281 | Discharge status: Against Medical Advice | Payer: Medicaid Other | Attending: Cardiology | Admitting: Cardiology

## 2021-11-04 ENCOUNTER — Emergency Department (HOSPITAL_COMMUNITY)
Admission: EM | Admit: 2021-11-04 | Discharge: 2021-11-04 | Disposition: A | Discharge status: Still In Hospital | Payer: Medicaid Other | Attending: Emergency Medicine | Admitting: Emergency Medicine

## 2021-11-04 DIAGNOSIS — I213 ST elevation (STEMI) myocardial infarction of unspecified site: Secondary | ICD-10-CM

## 2021-11-04 DIAGNOSIS — I2111 ST elevation (STEMI) myocardial infarction involving right coronary artery: Principal | ICD-10-CM | POA: Diagnosis present

## 2021-11-04 DIAGNOSIS — F17210 Nicotine dependence, cigarettes, uncomplicated: Secondary | ICD-10-CM | POA: Diagnosis present

## 2021-11-04 DIAGNOSIS — Z8051 Family history of malignant neoplasm of kidney: Secondary | ICD-10-CM

## 2021-11-04 DIAGNOSIS — K625 Hemorrhage of anus and rectum: Secondary | ICD-10-CM | POA: Diagnosis present

## 2021-11-04 DIAGNOSIS — I252 Old myocardial infarction: Secondary | ICD-10-CM

## 2021-11-04 DIAGNOSIS — E1165 Type 2 diabetes mellitus with hyperglycemia: Secondary | ICD-10-CM | POA: Diagnosis present

## 2021-11-04 DIAGNOSIS — Z5329 Procedure and treatment not carried out because of patient's decision for other reasons: Secondary | ICD-10-CM | POA: Diagnosis not present

## 2021-11-04 DIAGNOSIS — E782 Mixed hyperlipidemia: Secondary | ICD-10-CM | POA: Diagnosis present

## 2021-11-04 DIAGNOSIS — Z8711 Personal history of peptic ulcer disease: Secondary | ICD-10-CM

## 2021-11-04 DIAGNOSIS — I2510 Atherosclerotic heart disease of native coronary artery without angina pectoris: Secondary | ICD-10-CM | POA: Diagnosis present

## 2021-11-04 DIAGNOSIS — Z955 Presence of coronary angioplasty implant and graft: Secondary | ICD-10-CM

## 2021-11-04 DIAGNOSIS — Z5901 Sheltered homelessness: Secondary | ICD-10-CM

## 2021-11-04 DIAGNOSIS — Z8249 Family history of ischemic heart disease and other diseases of the circulatory system: Secondary | ICD-10-CM

## 2021-11-04 DIAGNOSIS — I10 Essential (primary) hypertension: Secondary | ICD-10-CM | POA: Diagnosis present

## 2021-11-04 DIAGNOSIS — Z823 Family history of stroke: Secondary | ICD-10-CM

## 2021-11-04 DIAGNOSIS — R001 Bradycardia, unspecified: Secondary | ICD-10-CM | POA: Diagnosis present

## 2021-11-04 DIAGNOSIS — Z20822 Contact with and (suspected) exposure to covid-19: Secondary | ICD-10-CM | POA: Diagnosis present

## 2021-11-04 DIAGNOSIS — F319 Bipolar disorder, unspecified: Secondary | ICD-10-CM | POA: Diagnosis present

## 2021-11-04 DIAGNOSIS — R0902 Hypoxemia: Secondary | ICD-10-CM | POA: Diagnosis present

## 2021-11-04 DIAGNOSIS — M19072 Primary osteoarthritis, left ankle and foot: Secondary | ICD-10-CM | POA: Diagnosis present

## 2021-11-04 MED ORDER — ONDANSETRON HCL 4 MG/2ML IJ SOLN
INTRAMUSCULAR | Status: AC
  Administered 2021-11-05: 4 mg via INTRAVENOUS
  Filled 2021-11-04: qty 2

## 2021-11-04 MED ORDER — ASPIRIN 81 MG PO CHEW
324.0000 mg | CHEWABLE_TABLET | Freq: Once | ORAL | Status: DC
  Filled 2021-11-04: qty 4

## 2021-11-04 MED ORDER — HYDROMORPHONE HCL 1 MG/ML IJ SOLN
INTRAMUSCULAR | Status: AC
  Administered 2021-11-04: 1 mg via INTRAVENOUS
  Filled 2021-11-04: qty 1

## 2021-11-04 MED ORDER — SODIUM CHLORIDE 0.9 % IV SOLN
INTRAVENOUS | Status: DC
  Administered 2021-11-05: 01:00:00 50 mL/h via INTRAVENOUS

## 2021-11-04 MED ORDER — SODIUM CHLORIDE 0.9 % IV BOLUS: 1000.0000 mL | Freq: Once | INTRAVENOUS | Status: DC

## 2021-11-04 MED ORDER — ONDANSETRON HCL 4 MG/2ML IJ SOLN: 4.0000 mg | Freq: Once | INTRAMUSCULAR | Status: AC

## 2021-11-04 MED ORDER — ASPIRIN 81 MG PO CHEW: 324.0000 mg | CHEWABLE_TABLET | Freq: Once | ORAL | Status: DC

## 2021-11-04 MED ORDER — HEPARIN SODIUM (PORCINE) 5000 UNIT/ML IJ SOLN
4000.0000 [IU] | Freq: Once | INTRAMUSCULAR | Status: AC
  Administered 2021-11-04: 4000 [IU] via INTRAVENOUS

## 2021-11-04 MED ORDER — HYDROMORPHONE HCL 1 MG/ML IJ SOLN
1.0000 mg | Freq: Once | INTRAMUSCULAR | Status: DC
  Filled 2021-11-04: qty 1

## 2021-11-04 MED ORDER — SODIUM CHLORIDE 0.9 % IV BOLUS
1000.0000 mL | Freq: Once | INTRAVENOUS | Status: AC
  Administered 2021-11-05: 1000 mL via INTRAVENOUS

## 2021-11-04 MED ORDER — HEPARIN SODIUM (PORCINE) 5000 UNIT/ML IJ SOLN
4000.0000 [IU] | Freq: Once | INTRAMUSCULAR | Status: DC
  Filled 2021-11-04: qty 1

## 2021-11-04 MED ORDER — HYDROMORPHONE HCL 1 MG/ML IJ SOLN: 1.0000 mg | Freq: Once | INTRAMUSCULAR | Status: AC

## 2021-11-04 MED ORDER — SODIUM CHLORIDE 0.9 % IV SOLN: INTRAVENOUS | Status: DC

## 2021-11-04 NOTE — ED Triage Notes (Signed)
BIBEMS c/o cp and headache, patient went unresponsive en route.   EMS: 324 ASA 3 nitro tabs 50 mcg fent 12mcg epi Paced at 90 bpm with 105amps  140/100 HR: 42

## 2021-11-05 ENCOUNTER — Inpatient Hospital Stay (HOSPITAL_COMMUNITY)
Admission: EM | Discharge status: Against Medical Advice | Payer: Self-pay | Source: Home / Self Care | Attending: Cardiology

## 2021-11-05 ENCOUNTER — Emergency Department (HOSPITAL_COMMUNITY): Payer: Medicaid Other

## 2021-11-05 ENCOUNTER — Encounter (HOSPITAL_COMMUNITY): Payer: Self-pay | Admitting: Cardiology

## 2021-11-05 DIAGNOSIS — Z5329 Procedure and treatment not carried out because of patient's decision for other reasons: Secondary | ICD-10-CM | POA: Diagnosis not present

## 2021-11-05 DIAGNOSIS — I2111 ST elevation (STEMI) myocardial infarction involving right coronary artery: Secondary | ICD-10-CM | POA: Diagnosis present

## 2021-11-05 DIAGNOSIS — Z5901 Sheltered homelessness: Secondary | ICD-10-CM | POA: Diagnosis not present

## 2021-11-05 DIAGNOSIS — F17210 Nicotine dependence, cigarettes, uncomplicated: Secondary | ICD-10-CM | POA: Diagnosis present

## 2021-11-05 DIAGNOSIS — Z823 Family history of stroke: Secondary | ICD-10-CM | POA: Diagnosis not present

## 2021-11-05 DIAGNOSIS — Z8249 Family history of ischemic heart disease and other diseases of the circulatory system: Secondary | ICD-10-CM | POA: Diagnosis not present

## 2021-11-05 DIAGNOSIS — Z8051 Family history of malignant neoplasm of kidney: Secondary | ICD-10-CM | POA: Diagnosis not present

## 2021-11-05 DIAGNOSIS — I213 ST elevation (STEMI) myocardial infarction of unspecified site: Secondary | ICD-10-CM

## 2021-11-05 DIAGNOSIS — E782 Mixed hyperlipidemia: Secondary | ICD-10-CM | POA: Diagnosis present

## 2021-11-05 DIAGNOSIS — Z8711 Personal history of peptic ulcer disease: Secondary | ICD-10-CM | POA: Diagnosis not present

## 2021-11-05 DIAGNOSIS — E1165 Type 2 diabetes mellitus with hyperglycemia: Secondary | ICD-10-CM | POA: Diagnosis present

## 2021-11-05 DIAGNOSIS — M19072 Primary osteoarthritis, left ankle and foot: Secondary | ICD-10-CM | POA: Diagnosis present

## 2021-11-05 DIAGNOSIS — I2510 Atherosclerotic heart disease of native coronary artery without angina pectoris: Secondary | ICD-10-CM | POA: Diagnosis present

## 2021-11-05 DIAGNOSIS — R001 Bradycardia, unspecified: Secondary | ICD-10-CM | POA: Diagnosis present

## 2021-11-05 DIAGNOSIS — R079 Chest pain, unspecified: Secondary | ICD-10-CM | POA: Diagnosis present

## 2021-11-05 DIAGNOSIS — Z955 Presence of coronary angioplasty implant and graft: Secondary | ICD-10-CM | POA: Diagnosis not present

## 2021-11-05 DIAGNOSIS — I10 Essential (primary) hypertension: Secondary | ICD-10-CM | POA: Diagnosis present

## 2021-11-05 DIAGNOSIS — I252 Old myocardial infarction: Secondary | ICD-10-CM | POA: Diagnosis not present

## 2021-11-05 DIAGNOSIS — F319 Bipolar disorder, unspecified: Secondary | ICD-10-CM | POA: Diagnosis present

## 2021-11-05 DIAGNOSIS — Z20822 Contact with and (suspected) exposure to covid-19: Secondary | ICD-10-CM | POA: Diagnosis present

## 2021-11-05 DIAGNOSIS — K625 Hemorrhage of anus and rectum: Secondary | ICD-10-CM | POA: Diagnosis present

## 2021-11-05 DIAGNOSIS — R0902 Hypoxemia: Secondary | ICD-10-CM | POA: Diagnosis present

## 2021-11-05 HISTORY — PX: LEFT HEART CATH AND CORONARY ANGIOGRAPHY: CATH118249

## 2021-11-05 LAB — CBC WITH DIFFERENTIAL/PLATELET
Abs Immature Granulocytes: 0.05 10*3/uL (ref 0.00–0.07)
Basophils Absolute: 0.1 10*3/uL (ref 0.0–0.1)
Basophils Relative: 1 %
Eosinophils Absolute: 0.2 10*3/uL (ref 0.0–0.5)
Eosinophils Relative: 1 %
HCT: 41.7 % (ref 36.0–46.0)
Hemoglobin: 13.3 g/dL (ref 12.0–15.0)
Immature Granulocytes: 0 %
Lymphocytes Relative: 50 %
Lymphs Abs: 6.1 10*3/uL — ABNORMAL HIGH (ref 0.7–4.0)
MCH: 28.4 pg (ref 26.0–34.0)
MCHC: 31.9 g/dL (ref 30.0–36.0)
MCV: 89.1 fL (ref 80.0–100.0)
Monocytes Absolute: 0.6 10*3/uL (ref 0.1–1.0)
Monocytes Relative: 5 %
Neutro Abs: 5.3 10*3/uL (ref 1.7–7.7)
Neutrophils Relative %: 43 %
Platelets: 226 10*3/uL (ref 150–400)
RBC: 4.68 MIL/uL (ref 3.87–5.11)
RDW: 13.2 % (ref 11.5–15.5)
WBC: 12.3 10*3/uL — ABNORMAL HIGH (ref 4.0–10.5)
nRBC: 0 % (ref 0.0–0.2)

## 2021-11-05 LAB — COMPREHENSIVE METABOLIC PANEL
ALT: 17 U/L (ref 0–44)
AST: 18 U/L (ref 15–41)
Albumin: 3 g/dL — ABNORMAL LOW (ref 3.5–5.0)
Alkaline Phosphatase: 147 U/L — ABNORMAL HIGH (ref 38–126)
Anion gap: 13 (ref 5–15)
BUN: 19 mg/dL (ref 6–20)
CO2: 19 mmol/L — ABNORMAL LOW (ref 22–32)
Calcium: 8.4 mg/dL — ABNORMAL LOW (ref 8.9–10.3)
Chloride: 99 mmol/L (ref 98–111)
Creatinine, Ser: 1 mg/dL (ref 0.44–1.00)
GFR, Estimated: >60 mL/min (ref >60)
Glucose, Bld: 529 mg/dL (ref 70–99)
Potassium: 3.4 mmol/L — ABNORMAL LOW (ref 3.5–5.1)
Sodium: 131 mmol/L — ABNORMAL LOW (ref 135–145)
Total Bilirubin: 0.9 mg/dL (ref 0.3–1.2)
Total Protein: 5.9 g/dL — ABNORMAL LOW (ref 6.5–8.1)

## 2021-11-05 LAB — LIPID PANEL
Cholesterol: 257 mg/dL — ABNORMAL HIGH (ref 0–200)
HDL: 27 mg/dL — ABNORMAL LOW (ref >40)
LDL Cholesterol: UNDETERMINED mg/dL (ref 0–99)
Total CHOL/HDL Ratio: 9.5 RATIO
Triglycerides: 699 mg/dL — ABNORMAL HIGH (ref <150)
VLDL: UNDETERMINED mg/dL (ref 0–40)

## 2021-11-05 LAB — POCT I-STAT, CHEM 8
BUN: 17 mg/dL (ref 6–20)
Calcium, Ion: 1.05 mmol/L — ABNORMAL LOW (ref 1.15–1.40)
Chloride: 101 mmol/L (ref 98–111)
Creatinine, Ser: 0.6 mg/dL (ref 0.44–1.00)
Glucose, Bld: 489 mg/dL — ABNORMAL HIGH (ref 70–99)
HCT: 37 % (ref 36.0–46.0)
Hemoglobin: 12.6 g/dL (ref 12.0–15.0)
Potassium: 3.5 mmol/L (ref 3.5–5.1)
Sodium: 136 mmol/L (ref 135–145)
TCO2: 25 mmol/L (ref 22–32)

## 2021-11-05 LAB — I-STAT BETA HCG BLOOD, ED (NOT ORDERABLE): I-stat hCG, quantitative: <5 m[IU]/mL (ref <5)

## 2021-11-05 LAB — APTT: aPTT: 25 seconds (ref 24–36)

## 2021-11-05 LAB — TROPONIN I (HIGH SENSITIVITY)
Troponin I (High Sensitivity): 13 ng/L (ref <18)
Troponin I (High Sensitivity): 674 ng/L (ref <18)

## 2021-11-05 LAB — RESP PANEL BY RT-PCR (FLU A&B, COVID) ARPGX2
Influenza A by PCR: NEGATIVE
Influenza B by PCR: NEGATIVE
SARS Coronavirus 2 by RT PCR: NEGATIVE

## 2021-11-05 LAB — PROTIME-INR
INR: 1 (ref 0.8–1.2)
Prothrombin Time: 12.9 seconds (ref 11.4–15.2)

## 2021-11-05 LAB — LDL CHOLESTEROL, DIRECT: Direct LDL: 97.8 mg/dL (ref 0–99)

## 2021-11-05 SURGERY — LEFT HEART CATH AND CORONARY ANGIOGRAPHY
Anesthesia: LOCAL

## 2021-11-05 MED ORDER — LACTATED RINGERS IV BOLUS: 500.0000 mL | Freq: Once | INTRAVENOUS | Status: DC

## 2021-11-05 MED ORDER — POTASSIUM CHLORIDE CRYS ER 20 MEQ PO TBCR: 40.0000 meq | EXTENDED_RELEASE_TABLET | Freq: Once | ORAL | Status: DC

## 2021-11-05 MED ORDER — SODIUM CHLORIDE 0.9% FLUSH: 3.0000 mL | Freq: Two times a day (BID) | INTRAVENOUS | Status: DC

## 2021-11-05 MED ORDER — LABETALOL HCL 5 MG/ML IV SOLN: 10.0000 mg | INTRAVENOUS | Status: DC | PRN

## 2021-11-05 MED ORDER — ACETAMINOPHEN 325 MG PO TABS: 650.0000 mg | ORAL_TABLET | ORAL | Status: DC | PRN

## 2021-11-05 MED ORDER — ATORVASTATIN CALCIUM 80 MG PO TABS: 80.0000 mg | ORAL_TABLET | Freq: Every day | ORAL | Status: DC

## 2021-11-05 MED ORDER — INSULIN ASPART 100 UNIT/ML IJ SOLN: 0.0000 [IU] | Freq: Every day | INTRAMUSCULAR | Status: DC

## 2021-11-05 MED ORDER — INSULIN ASPART 100 UNIT/ML IJ SOLN: 0.0000 [IU] | Freq: Three times a day (TID) | INTRAMUSCULAR | Status: DC

## 2021-11-05 MED ORDER — NITROGLYCERIN IN D5W 200-5 MCG/ML-% IV SOLN: 0.0000 ug/min | INTRAVENOUS | Status: DC

## 2021-11-05 MED ORDER — METOPROLOL TARTRATE 25 MG PO TABS: 25.0000 mg | ORAL_TABLET | Freq: Two times a day (BID) | ORAL | Status: DC

## 2021-11-05 MED ORDER — LIDOCAINE HCL (PF) 1 % IJ SOLN
INTRAMUSCULAR | Status: DC | PRN
  Administered 2021-11-05: 2 mL via SUBCUTANEOUS

## 2021-11-05 MED ORDER — HYDRALAZINE HCL 20 MG/ML IJ SOLN: 10.0000 mg | INTRAMUSCULAR | Status: DC | PRN

## 2021-11-05 MED ORDER — SODIUM CHLORIDE 0.9 % IV SOLN: INTRAVENOUS | Status: DC

## 2021-11-05 MED ORDER — LISINOPRIL 2.5 MG PO TABS: 2.5000 mg | ORAL_TABLET | Freq: Every day | ORAL | Status: DC

## 2021-11-05 MED ORDER — NITROGLYCERIN IN D5W 200-5 MCG/ML-% IV SOLN
INTRAVENOUS | Status: AC | PRN
  Administered 2021-11-05: 10 ug/min via INTRAVENOUS

## 2021-11-05 MED ORDER — SODIUM CHLORIDE 0.9 % IV SOLN: 250.0000 mL | INTRAVENOUS | Status: DC | PRN

## 2021-11-05 MED ORDER — VERAPAMIL HCL 2.5 MG/ML IV SOLN
INTRAVENOUS | Status: AC
  Filled 2021-11-05: qty 2

## 2021-11-05 MED ORDER — SODIUM CHLORIDE 0.9% FLUSH: 3.0000 mL | INTRAVENOUS | Status: DC | PRN

## 2021-11-05 MED ORDER — ASPIRIN EC 81 MG PO TBEC: 81.0000 mg | DELAYED_RELEASE_TABLET | Freq: Every day | ORAL | Status: DC

## 2021-11-05 MED ORDER — NITROGLYCERIN IN D5W 200-5 MCG/ML-% IV SOLN
INTRAVENOUS | Status: AC
  Filled 2021-11-05: qty 250

## 2021-11-05 MED ORDER — NITROGLYCERIN 1 MG/10 ML FOR IR/CATH LAB
INTRA_ARTERIAL | Status: AC
  Filled 2021-11-05: qty 10

## 2021-11-05 MED ORDER — HEPARIN (PORCINE) IN NACL 1000-0.9 UT/500ML-% IV SOLN
INTRAVENOUS | Status: DC | PRN
  Administered 2021-11-05 (×2): 500 mL

## 2021-11-05 MED ORDER — INSULIN ASPART 100 UNIT/ML IJ SOLN: 4.0000 [IU] | Freq: Three times a day (TID) | INTRAMUSCULAR | Status: DC

## 2021-11-05 MED ORDER — VERAPAMIL HCL 2.5 MG/ML IV SOLN
INTRAVENOUS | Status: DC | PRN
  Administered 2021-11-05: 10 mL via INTRA_ARTERIAL

## 2021-11-05 MED ORDER — ONDANSETRON HCL 4 MG/2ML IJ SOLN: 4.0000 mg | Freq: Four times a day (QID) | INTRAMUSCULAR | Status: DC | PRN

## 2021-11-05 MED ORDER — LIDOCAINE HCL (PF) 1 % IJ SOLN
INTRAMUSCULAR | Status: AC
  Filled 2021-11-05: qty 30

## 2021-11-05 MED ORDER — HEPARIN (PORCINE) IN NACL 1000-0.9 UT/500ML-% IV SOLN
INTRAVENOUS | Status: AC
  Filled 2021-11-05: qty 1000

## 2021-11-05 MED ORDER — INSULIN ASPART 100 UNIT/ML IJ SOLN: 15.0000 [IU] | Freq: Once | INTRAMUSCULAR | Status: DC

## 2021-11-05 MED ORDER — IOHEXOL 350 MG/ML SOLN
INTRAVENOUS | Status: DC | PRN
  Administered 2021-11-05: 01:00:00 30 mL via INTRA_ARTERIAL

## 2021-11-05 MED ORDER — HEPARIN SODIUM (PORCINE) 1000 UNIT/ML IJ SOLN
INTRAMUSCULAR | Status: AC
  Filled 2021-11-05: qty 10

## 2021-11-05 MED ORDER — HEPARIN SODIUM (PORCINE) 1000 UNIT/ML IJ SOLN
INTRAMUSCULAR | Status: DC | PRN
  Administered 2021-11-05: 4000 [IU] via INTRAVENOUS

## 2021-11-05 SURGICAL SUPPLY — 12 items
CATH OPTITORQUE TIG 4.0 5F (CATHETERS) ×2 IMPLANT
DEVICE RAD COMP TR BAND LRG (VASCULAR PRODUCTS) ×2 IMPLANT
GLIDESHEATH SLEND SS 6F .021 (SHEATH) ×2 IMPLANT
GUIDEWIRE INQWIRE 1.5J.035X260 (WIRE) IMPLANT
INQWIRE 1.5J .035X260CM (WIRE) ×3
KIT ENCORE 26 ADVANTAGE (KITS) ×2 IMPLANT
KIT HEART LEFT (KITS) ×3 IMPLANT
KIT HEMO VALVE WATCHDOG (MISCELLANEOUS) ×2 IMPLANT
PACK CARDIAC CATHETERIZATION (CUSTOM PROCEDURE TRAY) ×3 IMPLANT
SYR MEDRAD MARK 7 150ML (SYRINGE) ×3 IMPLANT
TRANSDUCER W/STOPCOCK (MISCELLANEOUS) ×3 IMPLANT
TUBING CIL FLEX 10 FLL-RA (TUBING) ×3 IMPLANT
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
## 2021-11-05 NOTE — Progress Notes (Signed)
°   11/05/21 0003  Clinical Encounter Type  Visited With Patient not available  Visit Type Other (Comment) (St[facility])  Referral From Nurse  Consult/Referral To Chaplain   Chaplain Jorene Guest responded. Ike Bene checked in the unit Network engineer, Jerene Pitch. There is no support person here at this time. Advise that the Chaplain remains available for follow-up spiritual and emotional support as needed. This note was prepared by Jeanine Luz, M.Div..  For questions please contact by phone [phone].

## 2021-11-05 NOTE — Progress Notes (Signed)
I spoke to the charge nurse. I share their concern. Patient has been refusing several aspects of nursing care. I personally spoke to the patient and emphasized importance of ongoing medical and nursing care. Patient remains very stressed and upset. She does have history of bipolar disorder, which may be contributing to the situation. However, she has capacity to make decisions. Theres a possibility that she may continue to refuse care.  I hope that she does not leave AMA and is able to comply with ongoing necessary medical nursing care.  Vernell Leep, MD

## 2021-11-05 NOTE — Progress Notes (Addendum)
Dr Virgina Jock made aware patient is refusing care and is threatening staff. Pt is not wanting to wear her oxygen regardless that her O2 Sat is dropping below 90%. Pt is not wanting to listen to anything the staff is sating. Pt has been educated on the importance of the staff caring for her. RN reminded patient that she is in the hospital and we are trying to help her by giving her the medications she needs. Pt stated "get the hell out of my room and leave me alone". Security made aware of patients level of aggression.

## 2021-11-05 NOTE — Progress Notes (Addendum)
Charge RN entered room from hearing patient yell at other staff members. Pt demanding to go outside to smoke a cigarette or she was going to smoke one in the room. Pt was explained that she was not allowed to do that. Charge RN offered to call attending to get pt a nicotine patch and patient stated "those don't do a damn thing for me". Pt is states "I want to leave this place NOW!". RN explained to the the patient the risk and benefits of leaving the hospital AMA. Pt still requesting to leave this facility immediately. RN offered patient the AMA form to be signed and patient stated "I will not sign anything". RN removed patients IV per patient demands. Pt had a TRBand applied to the Right Radial Artery that was needing to be removed by staff. After RN explained the risk of removing the TRBand against protocol. Pt stated "I do not care, take this damn thing off". RN called Dr Virgina Jock to update him on the situation and ask for further advice. Once RNs tried to remove TRBand per Dr Bonney Roussel orders, she refused and would not let staff assist her. Pt left our unit with security present and TRBand on Right Radial Artery. Pt was educated on the risk of leaving the TRBand on and not allowing the staff to remove it per protocol. Pt stated "I do not care I am leaving". Multiple RNs present during this episode.

## 2021-11-05 NOTE — Progress Notes (Signed)
Patient very aggressive towards nurses.  Patient has refused her medications and states she just wants to sleep. She has requested for 3 cups of water so far and states that's how she treats her high blood sugar.
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
## 2021-11-05 NOTE — Progress Notes (Signed)
Patient refusing care. Unable to get blood pressure readings . Patients TR band still present with no air removed yet.

## 2021-11-05 NOTE — ED Notes (Signed)
Patient registered under wrong name, chart corrected

## 2021-11-06 LAB — HEMOGLOBIN A1C

## 2021-11-06 MED FILL — Nitroglycerin IV Soln 100 MCG/ML in D5W: INTRA_ARTERIAL | Qty: 10 | Status: AC

## 2021-11-08 NOTE — Discharge Summary (Signed)
55 y.o. 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 Patient was admitted with STEMI  Coronary angiogram showed severe diffuse multivessel disease with TIMI III flow in culprit vessel RCA, that had 95% mRCA thrombotic lesion. Patient has known vaginal and rectal bleeding, is homeless. Not on any treatment currently for her uncontrolled DM. I was concerned re: patient's ability to take dual antiplatelet therapy for, what would otherwise be culprit and non-culprit vessel PCI for complete revascularization. I decided to admit the patient with plan to stabilize and seek CVTS consult for CABG. Aspirin, heparin, nitroglycerin, metoprolol, lisinopril.  However, patient refused multiple aspects of continue medical nursing care including not having nursing staff to check blood pressure, take care of TR band.  I personally spoke to the patient emphasized importance of continued medical management with consideration of surgical revascularization.  However, patient continued to be uncooperative throughout the rest of the hospital stay.  She reportedly was able to visit to several of the nursing staff members.  Eventually, patient decided to leave AMA without signing papers, in spite of personal intervention by myself, charge nurse.   Nigel Mormon, MD Pager: [phone] Office: [phone]
